# Patient Record
Sex: Male | Born: 1940 | Race: White | Hispanic: No | Marital: Married | Smoking: Former smoker
Health system: Southern US, Community
[De-identification: ages and names within clinical notes are randomized; demographics above are authoritative.]

## PROBLEM LIST (undated history)

## (undated) DIAGNOSIS — I482 Chronic atrial fibrillation, unspecified: Secondary | ICD-10-CM

## (undated) DIAGNOSIS — M109 Gout, unspecified: Secondary | ICD-10-CM

## (undated) DIAGNOSIS — E7800 Pure hypercholesterolemia, unspecified: Secondary | ICD-10-CM

## (undated) DIAGNOSIS — I340 Nonrheumatic mitral (valve) insufficiency: Secondary | ICD-10-CM

## (undated) DIAGNOSIS — M310 Hypersensitivity angiitis: Secondary | ICD-10-CM

## (undated) DIAGNOSIS — D0460 Carcinoma in situ of skin of unspecified upper limb, including shoulder: Secondary | ICD-10-CM

## (undated) DIAGNOSIS — T4145XA Adverse effect of unspecified anesthetic, initial encounter: Secondary | ICD-10-CM

## (undated) DIAGNOSIS — I5030 Unspecified diastolic (congestive) heart failure: Secondary | ICD-10-CM

## (undated) DIAGNOSIS — I10 Essential (primary) hypertension: Secondary | ICD-10-CM

## (undated) DIAGNOSIS — M4800 Spinal stenosis, site unspecified: Secondary | ICD-10-CM

## (undated) DIAGNOSIS — T8859XA Other complications of anesthesia, initial encounter: Secondary | ICD-10-CM

## (undated) DIAGNOSIS — IMO0002 Reserved for concepts with insufficient information to code with codable children: Secondary | ICD-10-CM

## (undated) HISTORY — PX: COLONOSCOPY: SHX174

## (undated) HISTORY — DX: Gout, unspecified: M10.9

## (undated) HISTORY — PX: CATARACT EXTRACTION: SUR2

## (undated) HISTORY — DX: Spinal stenosis, site unspecified: M48.00

## (undated) HISTORY — DX: Carcinoma in situ of skin of unspecified upper limb, including shoulder: D04.60

## (undated) HISTORY — DX: Pure hypercholesterolemia, unspecified: E78.00

---

## 1997-10-27 ENCOUNTER — Ambulatory Visit (HOSPITAL_COMMUNITY): Admission: RE | Admit: 1997-10-27 | Discharge: 1997-10-27 | Payer: Self-pay | Admitting: *Deleted

## 1998-02-08 ENCOUNTER — Ambulatory Visit (HOSPITAL_COMMUNITY): Admission: RE | Admit: 1998-02-08 | Discharge: 1998-02-08 | Payer: Self-pay | Admitting: Gastroenterology

## 2000-01-08 ENCOUNTER — Encounter: Payer: Self-pay | Admitting: *Deleted

## 2000-01-08 ENCOUNTER — Encounter: Admission: RE | Admit: 2000-01-08 | Discharge: 2000-01-08 | Payer: Self-pay | Admitting: *Deleted

## 2001-03-03 ENCOUNTER — Ambulatory Visit (HOSPITAL_COMMUNITY): Admission: RE | Admit: 2001-03-03 | Discharge: 2001-03-03 | Payer: Self-pay | Admitting: Gastroenterology

## 2002-08-18 ENCOUNTER — Encounter: Admission: RE | Admit: 2002-08-18 | Discharge: 2002-08-18 | Payer: Self-pay | Admitting: Internal Medicine

## 2002-08-18 ENCOUNTER — Encounter: Payer: Self-pay | Admitting: Internal Medicine

## 2002-09-11 ENCOUNTER — Encounter: Admission: RE | Admit: 2002-09-11 | Discharge: 2002-09-11 | Payer: Self-pay | Admitting: Internal Medicine

## 2002-09-11 ENCOUNTER — Encounter: Payer: Self-pay | Admitting: Internal Medicine

## 2008-06-18 HISTORY — PX: LEG SKIN LESION  BIOPSY / EXCISION: SUR473

## 2009-10-05 ENCOUNTER — Ambulatory Visit: Payer: Self-pay | Admitting: Vascular Surgery

## 2010-10-31 NOTE — Procedures (Signed)
RENAL ARTERY DUPLEX EVALUATION   INDICATION:  Elevated blood pressure, rule out renal artery stenosis.   HISTORY:  Diabetes:  No.  Cardiac:  No.  Hypertension:  Yes.  Smoking:  Previous.   RENAL ARTERY DUPLEX FINDINGS:  Aorta-Proximal:  97 cm/s  Aorta-Mid:  105 cm/s  Aorta-Distal:  99 cm/s  Celiac Artery Origin:  421 cm/s  SMA Origin:  185 cm/s                                    RIGHT               LEFT  Renal Artery Origin:             238 cm/s            103 cm/s  Renal Artery Proximal:           216 cm/s            186 cm/s  Renal Artery Mid:                169 cm/s            166 cm/s  Renal Artery Distal:             178 cm/s            83 cm/s  Hilar Acceleration Time (AT):  Renal-Aortic Ratio (RAR):        2.23                1.92  Kidney Size:                     12.8 cm             12.5 cm  End Diastolic Ratio (EDR):  Resistive Index (RI):            0.79                0.80   IMPRESSION:  1. Bilateral kidneys appear within normal limits in regards to shape      and size.  2. Patent bilateral renal arteries with mildly elevated velocities;      however, no evidence of >60% stenosis.  3. Bilateral renal-aortic ratios appear within normal limits.  4. Bilateral resistive indices appear slightly abnormal.  5. 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   ___________________________________________  Janetta Hora Fields, MD   AS/MEDQ  D:  10/05/2009  T:  10/05/2009  Job:  [id number]

## 2010-11-03 NOTE — Procedures (Signed)
[facility]  Patient:    Jacob Shannon, Jacob Shannon Visit Number: [id number] MRN: [id number]          Service Type: END Location: ENDO Attending Physician:  Louie Bun Dictated by:   Everardo All Madilyn Fireman, M.D. Proc. Date: 03/03/01 Admit Date:  03/03/2001   CC:         Shilpa N. Karilyn Cota, M.D.   Procedure Report  PROCEDURE:  Colonoscopy.  INDICATIONS FOR PROCEDURE:  History of adenomatous colon polyps.  Due for surveillance.  He has also had heme-positive stools recently.  DESCRIPTION OF PROCEDURE:  The patient was placed in the left lateral decubitus position and placed on the pulse monitor, with continuous low-flow oxygen delivered by nasal cannula.  He was sedated with 60 mg IV Demerol and 6 mg IV Versed.  The Olympus video colonoscope was inserted into the rectum and advanced to the cecum, confirmed by transillumination of McBurneys point and visualization of the ileocecal valve and appendiceal orifice.  The prep was good.  The cecum, ascending, transverse, descending and sigmoid colon all appeared normal; with no masses, polyps, diverticuli or other mucosal abnormalities.  The rectum appeared normal in retroflex view.  The anus did revealed some small internal hemorrhoids; they were moderate to large external hemorrhoids seen on visual examination.  The colonoscope was then withdrawn.  The patient returned to the recovery room in stable condition.  He tolerated the procedure well and there were no immediate complications.  IMPRESSION: 1. Internal and external hemorrhoids. 2. Otherwise normal colonoscopy.  PLAN:  Repeat colonoscopy in five years. Dictated by:   Everardo All Madilyn Fireman, M.D. Attending Physician:  Louie Bun DD:  03/03/01 TD:  03/03/01 Job: [id number]

## 2010-12-15 ENCOUNTER — Ambulatory Visit (HOSPITAL_COMMUNITY): Payer: Self-pay

## 2010-12-19 ENCOUNTER — Encounter (HOSPITAL_COMMUNITY): Payer: Self-pay

## 2011-08-22 DIAGNOSIS — Z125 Encounter for screening for malignant neoplasm of prostate: Secondary | ICD-10-CM | POA: Diagnosis not present

## 2011-08-22 DIAGNOSIS — I10 Essential (primary) hypertension: Secondary | ICD-10-CM | POA: Diagnosis not present

## 2011-08-22 DIAGNOSIS — Z6832 Body mass index (BMI) 32.0-32.9, adult: Secondary | ICD-10-CM | POA: Diagnosis not present

## 2011-08-22 DIAGNOSIS — I4891 Unspecified atrial fibrillation: Secondary | ICD-10-CM | POA: Diagnosis not present

## 2011-08-22 DIAGNOSIS — Z23 Encounter for immunization: Secondary | ICD-10-CM | POA: Diagnosis not present

## 2011-08-22 DIAGNOSIS — M109 Gout, unspecified: Secondary | ICD-10-CM | POA: Diagnosis not present

## 2011-08-22 DIAGNOSIS — Z79899 Other long term (current) drug therapy: Secondary | ICD-10-CM | POA: Diagnosis not present

## 2011-08-22 DIAGNOSIS — E7800 Pure hypercholesterolemia, unspecified: Secondary | ICD-10-CM | POA: Diagnosis not present

## 2011-08-22 DIAGNOSIS — Z0000 Encounter for general adult medical examination without abnormal findings: Secondary | ICD-10-CM | POA: Diagnosis not present

## 2011-10-05 DIAGNOSIS — J029 Acute pharyngitis, unspecified: Secondary | ICD-10-CM | POA: Diagnosis not present

## 2011-10-05 DIAGNOSIS — J320 Chronic maxillary sinusitis: Secondary | ICD-10-CM | POA: Diagnosis not present

## 2011-10-26 DIAGNOSIS — R21 Rash and other nonspecific skin eruption: Secondary | ICD-10-CM | POA: Diagnosis not present

## 2011-10-26 DIAGNOSIS — I4891 Unspecified atrial fibrillation: Secondary | ICD-10-CM | POA: Diagnosis not present

## 2011-12-13 DIAGNOSIS — D126 Benign neoplasm of colon, unspecified: Secondary | ICD-10-CM | POA: Diagnosis not present

## 2011-12-13 DIAGNOSIS — Z09 Encounter for follow-up examination after completed treatment for conditions other than malignant neoplasm: Secondary | ICD-10-CM | POA: Diagnosis not present

## 2011-12-13 DIAGNOSIS — Z800 Family history of malignant neoplasm of digestive organs: Secondary | ICD-10-CM | POA: Diagnosis not present

## 2011-12-13 DIAGNOSIS — K5730 Diverticulosis of large intestine without perforation or abscess without bleeding: Secondary | ICD-10-CM | POA: Diagnosis not present

## 2011-12-13 DIAGNOSIS — Z86010 Personal history of colonic polyps: Secondary | ICD-10-CM | POA: Diagnosis not present

## 2012-02-26 DIAGNOSIS — M109 Gout, unspecified: Secondary | ICD-10-CM | POA: Diagnosis not present

## 2012-02-26 DIAGNOSIS — I10 Essential (primary) hypertension: Secondary | ICD-10-CM | POA: Diagnosis not present

## 2012-02-26 DIAGNOSIS — I4891 Unspecified atrial fibrillation: Secondary | ICD-10-CM | POA: Diagnosis not present

## 2012-02-26 DIAGNOSIS — E7800 Pure hypercholesterolemia, unspecified: Secondary | ICD-10-CM | POA: Diagnosis not present

## 2012-02-26 DIAGNOSIS — Z79899 Other long term (current) drug therapy: Secondary | ICD-10-CM | POA: Diagnosis not present

## 2012-07-15 DIAGNOSIS — M109 Gout, unspecified: Secondary | ICD-10-CM | POA: Diagnosis not present

## 2012-07-15 DIAGNOSIS — M25559 Pain in unspecified hip: Secondary | ICD-10-CM | POA: Diagnosis not present

## 2012-08-27 DIAGNOSIS — Z125 Encounter for screening for malignant neoplasm of prostate: Secondary | ICD-10-CM | POA: Diagnosis not present

## 2012-08-27 DIAGNOSIS — E7800 Pure hypercholesterolemia, unspecified: Secondary | ICD-10-CM | POA: Diagnosis not present

## 2012-08-27 DIAGNOSIS — Z0000 Encounter for general adult medical examination without abnormal findings: Secondary | ICD-10-CM | POA: Diagnosis not present

## 2012-08-27 DIAGNOSIS — Z1331 Encounter for screening for depression: Secondary | ICD-10-CM | POA: Diagnosis not present

## 2012-08-27 DIAGNOSIS — Z79899 Other long term (current) drug therapy: Secondary | ICD-10-CM | POA: Diagnosis not present

## 2012-09-30 DIAGNOSIS — R062 Wheezing: Secondary | ICD-10-CM | POA: Diagnosis not present

## 2012-09-30 DIAGNOSIS — Z139 Encounter for screening, unspecified: Secondary | ICD-10-CM | POA: Diagnosis not present

## 2012-09-30 DIAGNOSIS — J189 Pneumonia, unspecified organism: Secondary | ICD-10-CM | POA: Diagnosis not present

## 2012-09-30 DIAGNOSIS — E860 Dehydration: Secondary | ICD-10-CM | POA: Diagnosis not present

## 2012-09-30 DIAGNOSIS — I10 Essential (primary) hypertension: Secondary | ICD-10-CM | POA: Diagnosis not present

## 2012-09-30 DIAGNOSIS — R0602 Shortness of breath: Secondary | ICD-10-CM | POA: Diagnosis not present

## 2012-09-30 DIAGNOSIS — N390 Urinary tract infection, site not specified: Secondary | ICD-10-CM | POA: Diagnosis not present

## 2012-10-01 DIAGNOSIS — Z136 Encounter for screening for cardiovascular disorders: Secondary | ICD-10-CM | POA: Diagnosis not present

## 2012-10-01 DIAGNOSIS — R5383 Other fatigue: Secondary | ICD-10-CM | POA: Diagnosis not present

## 2012-10-01 DIAGNOSIS — J189 Pneumonia, unspecified organism: Secondary | ICD-10-CM | POA: Diagnosis not present

## 2012-10-01 DIAGNOSIS — Z1329 Encounter for screening for other suspected endocrine disorder: Secondary | ICD-10-CM | POA: Diagnosis not present

## 2012-10-01 DIAGNOSIS — R042 Hemoptysis: Secondary | ICD-10-CM | POA: Diagnosis not present

## 2012-10-02 DIAGNOSIS — R042 Hemoptysis: Secondary | ICD-10-CM | POA: Diagnosis not present

## 2012-10-02 DIAGNOSIS — J189 Pneumonia, unspecified organism: Secondary | ICD-10-CM | POA: Diagnosis not present

## 2012-10-03 DIAGNOSIS — R05 Cough: Secondary | ICD-10-CM | POA: Diagnosis not present

## 2012-10-03 DIAGNOSIS — J189 Pneumonia, unspecified organism: Secondary | ICD-10-CM | POA: Diagnosis not present

## 2012-10-07 DIAGNOSIS — J189 Pneumonia, unspecified organism: Secondary | ICD-10-CM | POA: Diagnosis not present

## 2012-10-07 DIAGNOSIS — J90 Pleural effusion, not elsewhere classified: Secondary | ICD-10-CM | POA: Diagnosis not present

## 2012-10-10 DIAGNOSIS — R05 Cough: Secondary | ICD-10-CM | POA: Diagnosis not present

## 2012-10-10 DIAGNOSIS — R937 Abnormal findings on diagnostic imaging of other parts of musculoskeletal system: Secondary | ICD-10-CM | POA: Diagnosis not present

## 2012-10-10 DIAGNOSIS — J189 Pneumonia, unspecified organism: Secondary | ICD-10-CM | POA: Diagnosis not present

## 2012-10-23 DIAGNOSIS — I10 Essential (primary) hypertension: Secondary | ICD-10-CM | POA: Diagnosis not present

## 2012-10-23 DIAGNOSIS — R609 Edema, unspecified: Secondary | ICD-10-CM | POA: Diagnosis not present

## 2012-12-30 DIAGNOSIS — R209 Unspecified disturbances of skin sensation: Secondary | ICD-10-CM | POA: Diagnosis not present

## 2013-03-26 DIAGNOSIS — Z23 Encounter for immunization: Secondary | ICD-10-CM | POA: Diagnosis not present

## 2013-04-27 DIAGNOSIS — M4800 Spinal stenosis, site unspecified: Secondary | ICD-10-CM | POA: Diagnosis not present

## 2013-04-27 DIAGNOSIS — I10 Essential (primary) hypertension: Secondary | ICD-10-CM | POA: Diagnosis not present

## 2013-04-27 DIAGNOSIS — Z79899 Other long term (current) drug therapy: Secondary | ICD-10-CM | POA: Diagnosis not present

## 2013-04-27 DIAGNOSIS — E7800 Pure hypercholesterolemia, unspecified: Secondary | ICD-10-CM | POA: Diagnosis not present

## 2013-04-27 DIAGNOSIS — I4891 Unspecified atrial fibrillation: Secondary | ICD-10-CM | POA: Diagnosis not present

## 2013-09-21 DIAGNOSIS — R059 Cough, unspecified: Secondary | ICD-10-CM | POA: Diagnosis not present

## 2013-09-21 DIAGNOSIS — R509 Fever, unspecified: Secondary | ICD-10-CM | POA: Diagnosis not present

## 2013-09-21 DIAGNOSIS — Z6833 Body mass index (BMI) 33.0-33.9, adult: Secondary | ICD-10-CM | POA: Diagnosis not present

## 2013-09-21 DIAGNOSIS — R05 Cough: Secondary | ICD-10-CM | POA: Diagnosis not present

## 2013-09-23 DIAGNOSIS — L03119 Cellulitis of unspecified part of limb: Secondary | ICD-10-CM | POA: Diagnosis not present

## 2013-09-23 DIAGNOSIS — L02419 Cutaneous abscess of limb, unspecified: Secondary | ICD-10-CM | POA: Diagnosis not present

## 2013-09-24 DIAGNOSIS — L02419 Cutaneous abscess of limb, unspecified: Secondary | ICD-10-CM | POA: Diagnosis not present

## 2013-09-24 DIAGNOSIS — Z9181 History of falling: Secondary | ICD-10-CM | POA: Diagnosis not present

## 2013-09-24 DIAGNOSIS — Z1331 Encounter for screening for depression: Secondary | ICD-10-CM | POA: Diagnosis not present

## 2013-09-24 DIAGNOSIS — L03119 Cellulitis of unspecified part of limb: Secondary | ICD-10-CM | POA: Diagnosis not present

## 2013-09-25 DIAGNOSIS — L02419 Cutaneous abscess of limb, unspecified: Secondary | ICD-10-CM | POA: Diagnosis not present

## 2013-09-25 DIAGNOSIS — L988 Other specified disorders of the skin and subcutaneous tissue: Secondary | ICD-10-CM | POA: Diagnosis not present

## 2013-09-27 DIAGNOSIS — L988 Other specified disorders of the skin and subcutaneous tissue: Secondary | ICD-10-CM | POA: Diagnosis not present

## 2013-09-27 DIAGNOSIS — L03119 Cellulitis of unspecified part of limb: Secondary | ICD-10-CM | POA: Diagnosis not present

## 2013-09-27 DIAGNOSIS — L02419 Cutaneous abscess of limb, unspecified: Secondary | ICD-10-CM | POA: Diagnosis not present

## 2013-09-28 DIAGNOSIS — L02419 Cutaneous abscess of limb, unspecified: Secondary | ICD-10-CM | POA: Diagnosis not present

## 2013-09-28 DIAGNOSIS — L988 Other specified disorders of the skin and subcutaneous tissue: Secondary | ICD-10-CM | POA: Diagnosis not present

## 2013-09-29 DIAGNOSIS — L03119 Cellulitis of unspecified part of limb: Secondary | ICD-10-CM | POA: Diagnosis not present

## 2013-09-29 DIAGNOSIS — L02419 Cutaneous abscess of limb, unspecified: Secondary | ICD-10-CM | POA: Diagnosis not present

## 2013-09-29 DIAGNOSIS — L988 Other specified disorders of the skin and subcutaneous tissue: Secondary | ICD-10-CM | POA: Diagnosis not present

## 2013-10-28 DIAGNOSIS — E7800 Pure hypercholesterolemia, unspecified: Secondary | ICD-10-CM | POA: Diagnosis not present

## 2013-10-28 DIAGNOSIS — Z125 Encounter for screening for malignant neoplasm of prostate: Secondary | ICD-10-CM | POA: Diagnosis not present

## 2013-10-28 DIAGNOSIS — Z0000 Encounter for general adult medical examination without abnormal findings: Secondary | ICD-10-CM | POA: Diagnosis not present

## 2013-10-28 DIAGNOSIS — I776 Arteritis, unspecified: Secondary | ICD-10-CM | POA: Diagnosis not present

## 2013-10-28 DIAGNOSIS — I10 Essential (primary) hypertension: Secondary | ICD-10-CM | POA: Diagnosis not present

## 2013-10-29 DIAGNOSIS — H2510 Age-related nuclear cataract, unspecified eye: Secondary | ICD-10-CM | POA: Diagnosis not present

## 2013-10-30 DIAGNOSIS — H2510 Age-related nuclear cataract, unspecified eye: Secondary | ICD-10-CM | POA: Diagnosis not present

## 2013-11-03 ENCOUNTER — Ambulatory Visit: Payer: Self-pay | Admitting: Ophthalmology

## 2013-11-03 DIAGNOSIS — I10 Essential (primary) hypertension: Secondary | ICD-10-CM | POA: Diagnosis not present

## 2013-11-03 DIAGNOSIS — M109 Gout, unspecified: Secondary | ICD-10-CM | POA: Diagnosis not present

## 2013-11-03 DIAGNOSIS — H269 Unspecified cataract: Secondary | ICD-10-CM | POA: Diagnosis not present

## 2013-11-03 DIAGNOSIS — E669 Obesity, unspecified: Secondary | ICD-10-CM | POA: Diagnosis not present

## 2013-11-03 DIAGNOSIS — H2510 Age-related nuclear cataract, unspecified eye: Secondary | ICD-10-CM | POA: Diagnosis not present

## 2013-11-03 DIAGNOSIS — I4891 Unspecified atrial fibrillation: Secondary | ICD-10-CM | POA: Diagnosis not present

## 2013-11-03 DIAGNOSIS — Z79899 Other long term (current) drug therapy: Secondary | ICD-10-CM | POA: Diagnosis not present

## 2013-11-03 DIAGNOSIS — Z7901 Long term (current) use of anticoagulants: Secondary | ICD-10-CM | POA: Diagnosis not present

## 2013-11-03 DIAGNOSIS — R609 Edema, unspecified: Secondary | ICD-10-CM | POA: Diagnosis not present

## 2013-11-27 DIAGNOSIS — H2510 Age-related nuclear cataract, unspecified eye: Secondary | ICD-10-CM | POA: Diagnosis not present

## 2013-12-02 DIAGNOSIS — I776 Arteritis, unspecified: Secondary | ICD-10-CM | POA: Diagnosis not present

## 2013-12-02 DIAGNOSIS — M109 Gout, unspecified: Secondary | ICD-10-CM | POA: Diagnosis not present

## 2013-12-02 DIAGNOSIS — R609 Edema, unspecified: Secondary | ICD-10-CM | POA: Diagnosis not present

## 2013-12-02 DIAGNOSIS — I10 Essential (primary) hypertension: Secondary | ICD-10-CM | POA: Diagnosis not present

## 2013-12-08 ENCOUNTER — Ambulatory Visit: Payer: Self-pay | Admitting: Ophthalmology

## 2013-12-08 DIAGNOSIS — Z87891 Personal history of nicotine dependence: Secondary | ICD-10-CM | POA: Diagnosis not present

## 2013-12-08 DIAGNOSIS — M109 Gout, unspecified: Secondary | ICD-10-CM | POA: Diagnosis not present

## 2013-12-08 DIAGNOSIS — I10 Essential (primary) hypertension: Secondary | ICD-10-CM | POA: Diagnosis not present

## 2013-12-08 DIAGNOSIS — H2510 Age-related nuclear cataract, unspecified eye: Secondary | ICD-10-CM | POA: Diagnosis not present

## 2013-12-08 DIAGNOSIS — I4891 Unspecified atrial fibrillation: Secondary | ICD-10-CM | POA: Diagnosis not present

## 2014-05-04 DIAGNOSIS — Z9181 History of falling: Secondary | ICD-10-CM | POA: Diagnosis not present

## 2014-05-04 DIAGNOSIS — I4891 Unspecified atrial fibrillation: Secondary | ICD-10-CM | POA: Diagnosis not present

## 2014-05-04 DIAGNOSIS — Z1389 Encounter for screening for other disorder: Secondary | ICD-10-CM | POA: Diagnosis not present

## 2014-05-04 DIAGNOSIS — M109 Gout, unspecified: Secondary | ICD-10-CM | POA: Diagnosis not present

## 2014-05-04 DIAGNOSIS — Z79899 Other long term (current) drug therapy: Secondary | ICD-10-CM | POA: Diagnosis not present

## 2014-05-04 DIAGNOSIS — M4806 Spinal stenosis, lumbar region: Secondary | ICD-10-CM | POA: Diagnosis not present

## 2014-05-04 DIAGNOSIS — E780 Pure hypercholesterolemia: Secondary | ICD-10-CM | POA: Diagnosis not present

## 2014-05-04 DIAGNOSIS — Z23 Encounter for immunization: Secondary | ICD-10-CM | POA: Diagnosis not present

## 2014-05-04 DIAGNOSIS — I10 Essential (primary) hypertension: Secondary | ICD-10-CM | POA: Diagnosis not present

## 2014-07-23 DIAGNOSIS — Z961 Presence of intraocular lens: Secondary | ICD-10-CM | POA: Diagnosis not present

## 2014-10-05 DIAGNOSIS — J9601 Acute respiratory failure with hypoxia: Secondary | ICD-10-CM | POA: Diagnosis present

## 2014-10-05 DIAGNOSIS — R918 Other nonspecific abnormal finding of lung field: Secondary | ICD-10-CM | POA: Diagnosis not present

## 2014-10-05 DIAGNOSIS — M109 Gout, unspecified: Secondary | ICD-10-CM | POA: Diagnosis not present

## 2014-10-05 DIAGNOSIS — J9691 Respiratory failure, unspecified with hypoxia: Secondary | ICD-10-CM | POA: Diagnosis not present

## 2014-10-05 DIAGNOSIS — L03115 Cellulitis of right lower limb: Secondary | ICD-10-CM | POA: Diagnosis not present

## 2014-10-05 DIAGNOSIS — I517 Cardiomegaly: Secondary | ICD-10-CM | POA: Diagnosis not present

## 2014-10-05 DIAGNOSIS — M4806 Spinal stenosis, lumbar region: Secondary | ICD-10-CM | POA: Diagnosis present

## 2014-10-05 DIAGNOSIS — F05 Delirium due to known physiological condition: Secondary | ICD-10-CM | POA: Diagnosis not present

## 2014-10-05 DIAGNOSIS — R05 Cough: Secondary | ICD-10-CM | POA: Diagnosis not present

## 2014-10-05 DIAGNOSIS — R591 Generalized enlarged lymph nodes: Secondary | ICD-10-CM | POA: Diagnosis not present

## 2014-10-05 DIAGNOSIS — I4891 Unspecified atrial fibrillation: Secondary | ICD-10-CM | POA: Diagnosis not present

## 2014-10-05 DIAGNOSIS — I361 Nonrheumatic tricuspid (valve) insufficiency: Secondary | ICD-10-CM | POA: Diagnosis not present

## 2014-10-05 DIAGNOSIS — R0602 Shortness of breath: Secondary | ICD-10-CM | POA: Diagnosis not present

## 2014-10-05 DIAGNOSIS — E860 Dehydration: Secondary | ICD-10-CM | POA: Diagnosis not present

## 2014-10-05 DIAGNOSIS — I480 Paroxysmal atrial fibrillation: Secondary | ICD-10-CM | POA: Diagnosis not present

## 2014-10-05 DIAGNOSIS — E876 Hypokalemia: Secondary | ICD-10-CM | POA: Diagnosis not present

## 2014-10-05 DIAGNOSIS — J439 Emphysema, unspecified: Secondary | ICD-10-CM | POA: Diagnosis not present

## 2014-10-05 DIAGNOSIS — E871 Hypo-osmolality and hyponatremia: Secondary | ICD-10-CM | POA: Diagnosis not present

## 2014-10-05 DIAGNOSIS — D690 Allergic purpura: Secondary | ICD-10-CM | POA: Diagnosis not present

## 2014-10-05 DIAGNOSIS — R0902 Hypoxemia: Secondary | ICD-10-CM | POA: Diagnosis not present

## 2014-10-05 DIAGNOSIS — R531 Weakness: Secondary | ICD-10-CM | POA: Diagnosis not present

## 2014-10-05 DIAGNOSIS — R938 Abnormal findings on diagnostic imaging of other specified body structures: Secondary | ICD-10-CM | POA: Diagnosis not present

## 2014-10-05 DIAGNOSIS — E780 Pure hypercholesterolemia: Secondary | ICD-10-CM | POA: Diagnosis present

## 2014-10-05 DIAGNOSIS — I340 Nonrheumatic mitral (valve) insufficiency: Secondary | ICD-10-CM | POA: Diagnosis not present

## 2014-10-05 DIAGNOSIS — J810 Acute pulmonary edema: Secondary | ICD-10-CM | POA: Diagnosis not present

## 2014-10-05 DIAGNOSIS — I10 Essential (primary) hypertension: Secondary | ICD-10-CM | POA: Diagnosis present

## 2014-10-05 DIAGNOSIS — M7989 Other specified soft tissue disorders: Secondary | ICD-10-CM | POA: Diagnosis not present

## 2014-10-05 DIAGNOSIS — Z79899 Other long term (current) drug therapy: Secondary | ICD-10-CM | POA: Diagnosis not present

## 2014-10-05 DIAGNOSIS — L539 Erythematous condition, unspecified: Secondary | ICD-10-CM | POA: Diagnosis not present

## 2014-10-05 DIAGNOSIS — I350 Nonrheumatic aortic (valve) stenosis: Secondary | ICD-10-CM | POA: Diagnosis not present

## 2014-10-05 DIAGNOSIS — I472 Ventricular tachycardia: Secondary | ICD-10-CM | POA: Diagnosis not present

## 2014-10-05 DIAGNOSIS — I776 Arteritis, unspecified: Secondary | ICD-10-CM | POA: Diagnosis not present

## 2014-10-05 DIAGNOSIS — R509 Fever, unspecified: Secondary | ICD-10-CM | POA: Diagnosis not present

## 2014-10-07 DIAGNOSIS — I272 Pulmonary hypertension, unspecified: Secondary | ICD-10-CM | POA: Insufficient documentation

## 2014-10-13 DIAGNOSIS — L02419 Cutaneous abscess of limb, unspecified: Secondary | ICD-10-CM | POA: Diagnosis not present

## 2014-10-13 DIAGNOSIS — I4891 Unspecified atrial fibrillation: Secondary | ICD-10-CM | POA: Diagnosis not present

## 2014-10-13 DIAGNOSIS — R21 Rash and other nonspecific skin eruption: Secondary | ICD-10-CM | POA: Diagnosis not present

## 2014-10-13 DIAGNOSIS — I776 Arteritis, unspecified: Secondary | ICD-10-CM | POA: Diagnosis not present

## 2014-10-18 DIAGNOSIS — I776 Arteritis, unspecified: Secondary | ICD-10-CM | POA: Diagnosis not present

## 2014-10-18 DIAGNOSIS — I9589 Other hypotension: Secondary | ICD-10-CM | POA: Diagnosis not present

## 2014-10-18 DIAGNOSIS — R21 Rash and other nonspecific skin eruption: Secondary | ICD-10-CM | POA: Diagnosis not present

## 2014-10-27 DIAGNOSIS — Z9181 History of falling: Secondary | ICD-10-CM | POA: Diagnosis not present

## 2014-10-27 DIAGNOSIS — I10 Essential (primary) hypertension: Secondary | ICD-10-CM | POA: Diagnosis not present

## 2014-10-27 DIAGNOSIS — Z1389 Encounter for screening for other disorder: Secondary | ICD-10-CM | POA: Diagnosis not present

## 2014-10-27 DIAGNOSIS — I776 Arteritis, unspecified: Secondary | ICD-10-CM | POA: Diagnosis not present

## 2014-10-27 DIAGNOSIS — Z6832 Body mass index (BMI) 32.0-32.9, adult: Secondary | ICD-10-CM | POA: Diagnosis not present

## 2014-11-25 DIAGNOSIS — L02419 Cutaneous abscess of limb, unspecified: Secondary | ICD-10-CM | POA: Diagnosis not present

## 2014-11-25 DIAGNOSIS — Z6830 Body mass index (BMI) 30.0-30.9, adult: Secondary | ICD-10-CM | POA: Diagnosis not present

## 2014-11-25 DIAGNOSIS — I776 Arteritis, unspecified: Secondary | ICD-10-CM | POA: Diagnosis not present

## 2014-11-26 DIAGNOSIS — I776 Arteritis, unspecified: Secondary | ICD-10-CM | POA: Diagnosis not present

## 2014-11-26 DIAGNOSIS — L02419 Cutaneous abscess of limb, unspecified: Secondary | ICD-10-CM | POA: Diagnosis not present

## 2014-11-26 DIAGNOSIS — Z6833 Body mass index (BMI) 33.0-33.9, adult: Secondary | ICD-10-CM | POA: Diagnosis not present

## 2014-11-29 DIAGNOSIS — I776 Arteritis, unspecified: Secondary | ICD-10-CM | POA: Diagnosis not present

## 2014-12-17 DIAGNOSIS — I272 Other secondary pulmonary hypertension: Secondary | ICD-10-CM | POA: Diagnosis not present

## 2014-12-17 DIAGNOSIS — R21 Rash and other nonspecific skin eruption: Secondary | ICD-10-CM | POA: Diagnosis not present

## 2014-12-17 DIAGNOSIS — I776 Arteritis, unspecified: Secondary | ICD-10-CM | POA: Diagnosis not present

## 2014-12-17 DIAGNOSIS — R509 Fever, unspecified: Secondary | ICD-10-CM | POA: Diagnosis not present

## 2015-01-20 DIAGNOSIS — I776 Arteritis, unspecified: Secondary | ICD-10-CM | POA: Diagnosis not present

## 2015-01-20 DIAGNOSIS — I10 Essential (primary) hypertension: Secondary | ICD-10-CM | POA: Diagnosis not present

## 2015-01-20 DIAGNOSIS — E780 Pure hypercholesterolemia: Secondary | ICD-10-CM | POA: Diagnosis not present

## 2015-01-20 DIAGNOSIS — Z79899 Other long term (current) drug therapy: Secondary | ICD-10-CM | POA: Diagnosis not present

## 2015-01-20 DIAGNOSIS — Z0000 Encounter for general adult medical examination without abnormal findings: Secondary | ICD-10-CM | POA: Diagnosis not present

## 2015-01-20 DIAGNOSIS — I4891 Unspecified atrial fibrillation: Secondary | ICD-10-CM | POA: Diagnosis not present

## 2015-01-20 DIAGNOSIS — Z6833 Body mass index (BMI) 33.0-33.9, adult: Secondary | ICD-10-CM | POA: Diagnosis not present

## 2015-01-20 DIAGNOSIS — Z125 Encounter for screening for malignant neoplasm of prostate: Secondary | ICD-10-CM | POA: Diagnosis not present

## 2015-02-07 ENCOUNTER — Encounter: Payer: Self-pay | Admitting: *Deleted

## 2015-02-08 ENCOUNTER — Other Ambulatory Visit: Payer: Self-pay | Admitting: *Deleted

## 2015-02-08 ENCOUNTER — Encounter: Payer: Self-pay | Admitting: *Deleted

## 2015-02-08 ENCOUNTER — Other Ambulatory Visit (INDEPENDENT_AMBULATORY_CARE_PROVIDER_SITE_OTHER): Payer: Medicare Other

## 2015-02-08 ENCOUNTER — Ambulatory Visit (INDEPENDENT_AMBULATORY_CARE_PROVIDER_SITE_OTHER): Payer: Medicare Other | Admitting: Pulmonary Disease

## 2015-02-08 VITALS — BP 134/70 | HR 65 | Ht 67.5 in | Wt 209.8 lb

## 2015-02-08 DIAGNOSIS — I5032 Chronic diastolic (congestive) heart failure: Secondary | ICD-10-CM

## 2015-02-08 DIAGNOSIS — I10 Essential (primary) hypertension: Secondary | ICD-10-CM | POA: Insufficient documentation

## 2015-02-08 DIAGNOSIS — I270 Primary pulmonary hypertension: Secondary | ICD-10-CM

## 2015-02-08 DIAGNOSIS — I272 Pulmonary hypertension, unspecified: Secondary | ICD-10-CM

## 2015-02-08 DIAGNOSIS — I776 Arteritis, unspecified: Secondary | ICD-10-CM

## 2015-02-08 DIAGNOSIS — I5030 Unspecified diastolic (congestive) heart failure: Secondary | ICD-10-CM | POA: Insufficient documentation

## 2015-02-08 DIAGNOSIS — R599 Enlarged lymph nodes, unspecified: Secondary | ICD-10-CM | POA: Diagnosis not present

## 2015-02-08 DIAGNOSIS — R590 Localized enlarged lymph nodes: Secondary | ICD-10-CM

## 2015-02-08 DIAGNOSIS — I340 Nonrheumatic mitral (valve) insufficiency: Secondary | ICD-10-CM | POA: Insufficient documentation

## 2015-02-08 DIAGNOSIS — I4891 Unspecified atrial fibrillation: Secondary | ICD-10-CM | POA: Insufficient documentation

## 2015-02-08 DIAGNOSIS — J90 Pleural effusion, not elsewhere classified: Secondary | ICD-10-CM | POA: Insufficient documentation

## 2015-02-08 DIAGNOSIS — M109 Gout, unspecified: Secondary | ICD-10-CM | POA: Insufficient documentation

## 2015-02-08 LAB — C-REACTIVE PROTEIN: CRP: 1.2 mg/dL (ref 0.5–20.0)

## 2015-02-08 NOTE — Patient Instructions (Signed)
1. We're obtaining your pathology results from your dermatologist for my review. 2. I'm repeating a CT scan of your chest and echocardiogram of her heart to evaluate the elevated pressure within your heart, the enlarged lymph nodes around your windpipe, and your bilateral pleural effusions. 3. I'm checking blood work today for potential causes of your vasculitis. 4. I'm ordering breathing tests to evaluate you for possible underlying COPD. 5. You will return to clinic in 4-6 weeks but please call my office if you have any further questions or concerns.

## 2015-02-08 NOTE — Progress Notes (Signed)
Subjective:    Patient ID: Jacob Shannon, male    DOB: 01/04/1941, 74 y.o.   MRN: [id number]  HPI He reports he has had vasculitis first in 2010 and had a biopsy that showed as such.  He reports he has had it twice recently & was treated with antibiotics & Prednisone. It is only in his distal right lower extremity.  He reports there are also blisters that are fluid filled along with skin erythema.  He denies any other rashes. He does bruise easily. He reports a fever with the vasculitis but otherwise no fever, chills, or sweats. He denies any chest pain or pressure. He reports near syncope with turning quickly or standing up quickly. No syncope.  Reports he is able to climb 15 steps from his basement with ease. He reports dyspnea with large meals & exertion. He denies any coughing. He reports wheezing only with respiratory illnesses. No chest pain, tightness, or pressure. He reports edema only in his right leg. He reports only very brief morning stiffness. No joint swelling or erythema.   Review of Systems Denies any dysuria or hematuria. No nausea, emesis, or diarrhea.  A pertinent 14 point review of systems is negative except as per the history of presenting illness.  No Known Allergies  Outpatient Encounter Prescriptions as of 02/08/2015  Medication Sig Note  . amLODipine (NORVASC) 5 MG tablet Take 5 mg by mouth daily.  02/08/2015: Received from: External Pharmacy  . DIGOX 125 MCG tablet Take 0.125 mg by mouth daily.  02/08/2015: Received from: External Pharmacy  . furosemide (LASIX) 40 MG tablet Take 40 mg by mouth daily.  02/08/2015: Received from: External Pharmacy  . losartan (COZAAR) 100 MG tablet Take 100 mg by mouth daily.  02/08/2015: Received from: External Pharmacy  . metoprolol succinate (TOPROL-XL) 100 MG 24 hr tablet Take 100 mg by mouth daily.  02/08/2015: Received from: External Pharmacy  . potassium chloride SA (K-DUR,KLOR-CON) 20 MEQ tablet Take 20 mEq by mouth 2 (two) times daily.   02/08/2015: Received from: External Pharmacy  . PRADAXA 150 MG CAPS capsule Take 150 mg by mouth 2 (two) times daily.  02/08/2015: Received from: External Pharmacy  . [DISCONTINUED] clindamycin (CLEOCIN) 300 MG capsule  02/08/2015: Received from: External Pharmacy  . [DISCONTINUED] colchicine 0.6 MG tablet  02/08/2015: Received from: External Pharmacy  . [DISCONTINUED] predniSONE (DELTASONE) 20 MG tablet  02/08/2015: Received from: External Pharmacy   No facility-administered encounter medications on file as of 02/08/2015.   Past Medical History  Diagnosis Date  . Atrial fibrillation   . Gout   . Hypertension   . Spinal stenosis   . 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 Vasculitis   . Hypercholesterolemia    Past Surgical History  Procedure Laterality Date  . Leg skin lesion  biopsy / excision Right 2010  . Colonoscopy    . Cataract extraction Bilateral     Family History  Problem Relation Age of Onset  . Colon cancer Brother   . Coronary artery disease Brother   . Stomach cancer Brother   . Heart attack Father   . Coronary artery disease Father   . Lung disease Neg Hx   . Rheumatologic disease Neg Hx    Social History   Social History  . Marital Status: Married    Spouse Name: N/A  . Number of Children: 2  . Years of Education: N/A  Occupational History  . self employed      [employer]   Social History Main Topics  . Smoking status: Former Smoker -- 1.50 packs/day for 38 years    Types: Cigarettes    Start date: 06/18/1956    Quit date: 06/18/1994  . Smokeless tobacco: None  . Alcohol Use: 0.0 oz/week    0 Standard drinks or equivalent per week     Comment: Beer on a weekly basis.  . Drug Use: No  . Sexual Activity: Not Asked   Other Topics Concern  . None   Social History Narrative   Originally from [place]. Always lived in [place]. Prior travel to [facility]. 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 No pets currently. No bird, asbestos, mold, or hot tub exposure. He enjoys fishing.       Objective:   Physical Exam Blood pressure 134/70, pulse 65, height 5' 7.5" (1.715 m), weight 209 lb 12.8 oz (95.165 kg), SpO2 98 %. General:  Awake. Alert. No acute distress. Caucasian male with mild central obesity.  Integument:  Warm & dry. No rash on exposed skin. No bruising. Skin discoloration from venous stasis and right lower extremity. Lymphatics:  No appreciated cervical or supraclavicular lymphadenoapthy. HEENT:  Moist mucus membranes. No oral ulcers. No scleral injection or icterus. Cardiovascular:  Regular rate. 1+ pitting bilateral lower extremity edema. No appreciable JVD. Bilateral lower extremity varicose veins noted.   Pulmonary:  Mild bilateral basilar crackles. Symmetric chest wall expansion. No accessory muscle use. Abdomen: Soft. Normal bowel sounds. Nondistended. Grossly nontender. Musculoskeletal:  Normal bulk and tone. Hand grip strength 5/5 bilaterally. No joint deformity or effusion appreciated. Neurological:  CN 2-12 grossly in tact. No meningismus. Moving all 4 extremities equally. Symmetric patellar deep tendon reflexes. Psychiatric:  Mood and affect congruent. Speech normal rhythm, rate & tone.   IMAGING CTA CHEST 10/07/14 (personally reviewed by me): Significant motion artifact. Cardiomegaly noted. Small bilateral pleural effusions. 1.1 cm precarinal lymph node & 1.3 cm subcarinal lymph node noted. Bilateral lower lung dependent opacities. No pulmonary nodule or mass appreciated. Lobular hepatic contour suggestive of cirrhosis. Enlarged gastrohepatic ligament lymph node measuring 1.6 cm by radiologist. Trace pericardial effusion. No pulmonary embolus.  CARDIAC TTE (10/07/14): Technically difficult study with suboptimal views. Mild concentric LVH with EF 60-65%. Without regional wall motion abnormality. LA & RA  markedly enlarged. RV normal in size and function. RVSP 62 mmHg. IVC dilated. No pericardial effusion. Mild aortic valve sclerosis. Moderate mitral regurgitation. Moderate to severe tricuspid regurgitation. No pulmonic regurgitation.  LABS 10/06/14 CBC: 19.2/13.4/41.0/119 BMP: 135/4.2/101/26/19/0.8/118/8.0 ABG (on 2 L/m): 7.42/33/71    Assessment & Plan:  The patient is a 74 year old male with a significant prior history of tobacco use reporting that he has had repeated episodes of "vasculitis" primarily involving his right lower extremity. He does report fluid filled bullae with these episodes. He reports that he did previously have a skin biopsy which showed vasculitis but this is not available for my review at this time. In reviewing his outside records and chest CT imaging he does have significant mitral regurgitation as well as left ventricular hypertrophy which are likely contributing to the bilateral pleural effusions and mediastinal lymphadenopathy seen. Certainly a pulmonary vasculitis is possible but somewhat unlikely given his age and historical description of his "vasculitis". I do question whether or not he has underlying COPD  given his history of tobacco use. I instructed the patient contact my office if he had any further questions or concerns before his next appointment. We're attempting to schedule his testing as close to home as possible for ease of travel.  1. Pulmonary hypertension: Likely secondary to diastolic congestive heart failure & mitral regurgitation. Repeating transthoracic echocardiogram to reevaluate the patient's RVSP and right ventricular function on Lasix. 2. Chronic diastolic congestive heart failure: Patient currently on Lasix. Unable to appreciate JVD but does continue to have mild pitting lower extremity edema possibly secondary to venous insufficiency. 3. Vasculitis: Requesting pathology results from his dermatologist. Checking serum ESR, CRP, ANA with reflex to  comprehensive panel, & ANCA panel. 4. Mediastinal lymphadenopathy: Likely secondary to diastolic congestive heart failure. Repeating CT scan of the chest without contrast to reevaluate. 5. Bilateral pleural effusion: Likely secondary to decompensated diastolic congestive heart failure. Repeating CT scan of the chest without contrast. Consider thoracentesis if these are enlarging. 6. Follow-up: Patient to return to clinic in 4-6 weeks or sooner if needed.

## 2015-02-09 ENCOUNTER — Telehealth (HOSPITAL_COMMUNITY): Payer: Self-pay | Admitting: *Deleted

## 2015-02-09 DIAGNOSIS — M2550 Pain in unspecified joint: Secondary | ICD-10-CM | POA: Diagnosis not present

## 2015-02-09 DIAGNOSIS — M310 Hypersensitivity angiitis: Secondary | ICD-10-CM | POA: Diagnosis not present

## 2015-02-09 DIAGNOSIS — R5081 Fever presenting with conditions classified elsewhere: Secondary | ICD-10-CM | POA: Diagnosis not present

## 2015-02-09 LAB — ANA COMPREHENSIVE PANEL
Anti JO-1: <0.2 AI (ref 0.0–0.9)
Centromere Ab Screen: <0.2 AI (ref 0.0–0.9)
ENA RNP Ab: 0.3 AI (ref 0.0–0.9)
dsDNA Ab: 1 IU/mL (ref 0–9)

## 2015-02-09 LAB — ANA W/REFLEX: ANA: NEGATIVE

## 2015-02-09 LAB — SEDIMENTATION RATE: Sed Rate: 17 mm/hr (ref 0–22)

## 2015-02-10 ENCOUNTER — Telehealth (HOSPITAL_COMMUNITY): Payer: Self-pay | Admitting: *Deleted

## 2015-02-11 DIAGNOSIS — M2550 Pain in unspecified joint: Secondary | ICD-10-CM | POA: Diagnosis not present

## 2015-02-11 DIAGNOSIS — R5081 Fever presenting with conditions classified elsewhere: Secondary | ICD-10-CM | POA: Diagnosis not present

## 2015-02-11 DIAGNOSIS — M310 Hypersensitivity angiitis: Secondary | ICD-10-CM | POA: Diagnosis not present

## 2015-02-11 DIAGNOSIS — L03115 Cellulitis of right lower limb: Secondary | ICD-10-CM | POA: Diagnosis not present

## 2015-02-14 DIAGNOSIS — R609 Edema, unspecified: Secondary | ICD-10-CM | POA: Diagnosis not present

## 2015-02-14 DIAGNOSIS — I776 Arteritis, unspecified: Secondary | ICD-10-CM | POA: Diagnosis not present

## 2015-02-14 DIAGNOSIS — L03115 Cellulitis of right lower limb: Secondary | ICD-10-CM | POA: Diagnosis not present

## 2015-02-14 DIAGNOSIS — Z6832 Body mass index (BMI) 32.0-32.9, adult: Secondary | ICD-10-CM | POA: Diagnosis not present

## 2015-02-15 DIAGNOSIS — J90 Pleural effusion, not elsewhere classified: Secondary | ICD-10-CM | POA: Diagnosis not present

## 2015-02-15 DIAGNOSIS — R591 Generalized enlarged lymph nodes: Secondary | ICD-10-CM | POA: Diagnosis not present

## 2015-02-15 DIAGNOSIS — I083 Combined rheumatic disorders of mitral, aortic and tricuspid valves: Secondary | ICD-10-CM | POA: Diagnosis not present

## 2015-02-15 DIAGNOSIS — I270 Primary pulmonary hypertension: Secondary | ICD-10-CM | POA: Diagnosis not present

## 2015-02-15 DIAGNOSIS — I776 Arteritis, unspecified: Secondary | ICD-10-CM | POA: Diagnosis not present

## 2015-02-15 DIAGNOSIS — J9811 Atelectasis: Secondary | ICD-10-CM | POA: Diagnosis not present

## 2015-03-01 DIAGNOSIS — I270 Primary pulmonary hypertension: Secondary | ICD-10-CM | POA: Diagnosis not present

## 2015-03-01 DIAGNOSIS — R599 Enlarged lymph nodes, unspecified: Secondary | ICD-10-CM | POA: Diagnosis not present

## 2015-03-01 LAB — PULMONARY FUNCTION TEST

## 2015-03-08 ENCOUNTER — Encounter: Payer: Self-pay | Admitting: Pulmonary Disease

## 2015-03-08 ENCOUNTER — Ambulatory Visit (INDEPENDENT_AMBULATORY_CARE_PROVIDER_SITE_OTHER): Payer: Medicare Other | Admitting: Pulmonary Disease

## 2015-03-08 ENCOUNTER — Other Ambulatory Visit: Payer: Medicare Other

## 2015-03-08 VITALS — BP 132/72 | HR 83 | Ht 67.5 in | Wt 210.8 lb

## 2015-03-08 DIAGNOSIS — I776 Arteritis, unspecified: Secondary | ICD-10-CM

## 2015-03-08 DIAGNOSIS — I270 Primary pulmonary hypertension: Secondary | ICD-10-CM

## 2015-03-08 DIAGNOSIS — R590 Localized enlarged lymph nodes: Secondary | ICD-10-CM | POA: Insufficient documentation

## 2015-03-08 DIAGNOSIS — R599 Enlarged lymph nodes, unspecified: Secondary | ICD-10-CM | POA: Diagnosis not present

## 2015-03-08 DIAGNOSIS — I272 Pulmonary hypertension, unspecified: Secondary | ICD-10-CM

## 2015-03-08 NOTE — Patient Instructions (Signed)
1. Please call my office if you have any new breathing problems. 2. I will call you if your blood test today is abnormal. 3. Your primary care doctor can follow your enlarged mediastinal lymph node every 6 months to make sure it doesn't develop. 4. I will see you back on an as needed.

## 2015-03-08 NOTE — Progress Notes (Signed)
Subjective:    Patient ID: Jacob Shannon, male    DOB: [birth year], 74 y.o.   MRN: [id number]  C.C.: Follow-up for Pulmonary Hypertension, Vasculitis, Bilateral Pleural Effusions, & Mediastinal Adenopathy.  HPI Pulmonary Hypertension: Likely secondary to left ventricular diastolic dysfunction & mitral regurgitation. RVSP has mildly improved on diuretic therapy based on last echocardiogram. No near syncope or syncope.   Vasculitis: After last appointment patient had a flare of his lower extremity vasculitis and was treated with a prednisone taper. 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 He was also treated with a course of antibiotics as well. He reports there was no blistering of his leg but he has had some skin peeling.  Mediastinal Adenopathy: Likely secondary to diastolic congestive heart failure. Patient now only has a right peritracheal precarinal lymph node enlarged at 1.2 cm. No weight loss. No adenopathy in his neck, groin, or axilla.  Bilateral Pleural Effusions: Known diastolic congestive heart failure. No evidence of pleural effusion on repeat chest CT imaging.  Review of Systems He continues to have lower extremity edema. He denies any chest pain or pressure. No fever, chills, or sweats. No dysuria or hematuria. No urinary hesitancy.   No Known Allergies  Outpatient Encounter Prescriptions as of 03/08/2015  Medication Sig Note  . amLODipine (NORVASC) 5 MG tablet Take 5 mg by mouth daily.  02/08/2015: Received from: External Pharmacy  . DIGOX 125 MCG tablet Take 0.125 mg by mouth daily.  02/08/2015: Received from: External Pharmacy  . furosemide (LASIX) 40 MG tablet Take 40 mg by mouth daily.  02/08/2015: Received from: External Pharmacy  . losartan (COZAAR) 100 MG tablet Take 100 mg by mouth daily.  02/08/2015: Received from: External Pharmacy  . metoprolol succinate (TOPROL-XL) 100 MG 24 hr tablet Take 100 mg by mouth daily.  02/08/2015: Received from:  External Pharmacy  . potassium chloride SA (K-DUR,KLOR-CON) 20 MEQ tablet Take 20 mEq by mouth 2 (two) times daily.  02/08/2015: Received from: External Pharmacy  . PRADAXA 150 MG CAPS capsule Take 150 mg by mouth 2 (two) times daily.  02/08/2015: Received from: External Pharmacy   No facility-administered encounter medications on file as of 03/08/2015.   Past Medical History  Diagnosis Date  . Atrial fibrillation   . Gout   . Hypertension   . Spinal stenosis   . 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 Vasculitis   . Hypercholesterolemia    Past Surgical History  Procedure Laterality Date  . Leg skin lesion  biopsy / excision Right 2010  . Colonoscopy    . Cataract extraction Bilateral     Family History  Problem Relation Age of Onset  . Colon cancer Brother   . Coronary artery disease Brother   . Stomach cancer Brother   . Heart attack Father   . Coronary artery disease Father   . Lung disease Neg Hx   . Rheumatologic disease Neg Hx    Social History   Social History  . Marital Status: Married    Spouse Name: N/A  . Number of Children: 2  . Years of Education: N/A   Occupational History  . self employed      [employer]   Social History Main Topics  . Smoking status: Former Smoker -- 1.50 packs/day for 38 years    Types: Cigarettes    Start date: 06/18/1956    Quit date: 06/18/1994  .  Smokeless tobacco: None  . Alcohol Use: 0.0 oz/week    0 Standard drinks or equivalent per week     Comment: Beer on a weekly basis.  . Drug Use: No  . Sexual Activity: Not Asked   Other Topics Concern  . None   Social History Narrative   Originally from [place]. Always lived in [place]. Prior travel to [facility]. 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 No pets currently. No bird, asbestos, mold, or hot tub exposure. He enjoys fishing.       Objective:    Physical Exam Blood pressure 132/72, pulse 83, height 5' 7.5" (1.715 m), weight 210 lb 12.8 oz (95.618 kg), SpO2 98 %. General:  Awake. Alert. Caucasian male with mild central obesity.  Integument:  Warm & dry. Mild skin erythema and epidermal sloughing to the distal right lower extremity. Lymphatics:  No appreciated cervical or supraclavicular lymphadenoapthy. HEENT:  Moist mucus membranes. No oral ulcers. No scleral injection or icterus. Cardiovascular:  Regular rate but irregular rhythm. Pitting lower extremity edema bilaterally relatively unchanged. Pulmonary:  Mild crackles at the right lung base. Symmetric chest wall expansion. Normal work of breathing on room air. Musculoskeletal:  Normal bulk and tone. Hand grip strength 5/5 bilaterally. No joint deformity or effusion appreciated.  PFT  03/01/15: FVC 3.39 L (91%) FEV1 2.53 L (95%) FEV1/FVC 0.75 FEF 25-75 2.01 L (106%) no bronchodilator response TLC 7.54 L (117%) RV 170% ERV 102% DLCO uncorrected 72%  IMAGING CT CHEST W/O 02/15/15 (personally reviewed by me): No pleural effusion or thickening. Dependent platelike atelectasis noted. No parenchymal nodule or opacity appreciated. Right upper lobe subpleural bleb noted medially. Otherwise no appreciable emphysema. Mediastinal adenopathy noted but only precarinal lymph node at station 4R enlarged to 1.2 cm in short axis. Cardiomegaly noted.  CTA CHEST 10/07/14 (previously reviewed by me): Significant motion artifact. Cardiomegaly noted. Small bilateral pleural effusions. 1.1 cm precarinal lymph node & 1.3 cm subcarinal lymph node noted. Bilateral lower lung dependent opacities. No pulmonary nodule or mass appreciated. Lobular hepatic contour suggestive of cirrhosis. Enlarged gastrohepatic ligament lymph node measuring 1.6 cm by radiologist. Trace pericardial effusion. No pulmonary embolus.  CARDIAC TTE (02/15/15): Atrial fibrillation noted on EKG. Suboptimal views. LV normal in size with EF 50-55%.  No regional wall motion abnormalities. LA severely dilated. RA mildly dilated. RV normal in size and function. RVSP 49 mmHg. IVC dilated consistent with elevated right atrial pressure. No aortic stenosis or regurgitation. Moderate mitral regurgitation. Mild to moderate tricuspid regurgitation. No pulmonic regurgitation. No pericardial effusion.  TTE (10/07/14): Technically difficult study with suboptimal views. Mild concentric LVH with EF 60-65%. Without regional wall motion abnormality. LA & RA markedly enlarged. RV normal in size and function. RVSP 62 mmHg. IVC dilated. No pericardial effusion. Mild aortic valve sclerosis. Moderate mitral regurgitation. Moderate to severe tricuspid regurgitation. No pulmonic regurgitation.  PATHOLOGY 4MM SKIN PUNCH BX (11/04/08):  Leukocytoclasticperivascular infiltrate with fibrinoid change and focal subepidermal vesiculation. Favor leukocytoclastic vasculitis.  LABS 02/08/15 CRP: 1.2 ESR: 17 ANA: Negative Anti-Jo1:  <0.2 Anti-Centromere:  <0.2 Anti-double-stranded DNA: 1 RNP Antibody:  0.3 SSA:  <0.2 SSB:  <0.2 SCL-70:  <0.2 Anti-Chromatin Ab: <0.2  10/06/14 CBC: 19.2/13.4/41.0/119 BMP: 135/4.2/101/26/19/0.8/118/8.0 ABG (on 2 L/m): 7.42/33/71    Assessment & Plan:  74 year old male with diastolic congestive heart failure resulting in pulmonary hypertension and bilateral pleural effusions. The patient's bilateral  pleural effusions have resolved. He does have a mildly enlarged precarinal lymph node likely secondary to his diastolic congestive heart failure. 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 I do not favor a systemic vasculitis given blood work in this biopsy result. I instructed the patient contact my office if he develops any new breathing problems and will defer to his primary care provider to follow-up his enlarged mediastinal lymph node to ensure he does not  develop into something pathologic.  1. Pulmonary hypertension: Secondary to left ventricular dysfunction. No need for further testing. Recommend patient continue diuretic therapy. 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 I will contact the patient if this result is abnormal. No further testing necessary. 3. Mediastinal adenopathy: Recommend repeat CT scan of the chest without contrast in 6 months. If lymph node persists and remains enlarged I would recommend a repeat CT scan 6 months following and then if unchanged repeat imaging only for symptoms. 4. Follow-up: Patient will return to clinic on an as-needed basis.

## 2015-03-09 LAB — ANCA SCREEN W REFLEX TITER: ANCA Screen: NEGATIVE

## 2015-03-14 ENCOUNTER — Encounter: Payer: Self-pay | Admitting: Pulmonary Disease

## 2015-03-14 ENCOUNTER — Telehealth: Payer: Self-pay | Admitting: Critical Care Medicine

## 2015-03-14 NOTE — Telephone Encounter (Signed)
Opened in error

## 2015-04-04 DIAGNOSIS — J189 Pneumonia, unspecified organism: Secondary | ICD-10-CM | POA: Diagnosis not present

## 2015-04-25 DIAGNOSIS — R1031 Right lower quadrant pain: Secondary | ICD-10-CM | POA: Diagnosis not present

## 2015-04-25 DIAGNOSIS — Z6834 Body mass index (BMI) 34.0-34.9, adult: Secondary | ICD-10-CM | POA: Diagnosis not present

## 2015-05-04 DIAGNOSIS — Z85828 Personal history of other malignant neoplasm of skin: Secondary | ICD-10-CM | POA: Diagnosis not present

## 2015-05-04 DIAGNOSIS — Z6831 Body mass index (BMI) 31.0-31.9, adult: Secondary | ICD-10-CM | POA: Diagnosis not present

## 2015-05-04 DIAGNOSIS — R0602 Shortness of breath: Secondary | ICD-10-CM | POA: Diagnosis not present

## 2015-05-04 DIAGNOSIS — M109 Gout, unspecified: Secondary | ICD-10-CM | POA: Diagnosis not present

## 2015-05-04 DIAGNOSIS — J95821 Acute postprocedural respiratory failure: Secondary | ICD-10-CM | POA: Diagnosis not present

## 2015-05-04 DIAGNOSIS — I9581 Postprocedural hypotension: Secondary | ICD-10-CM | POA: Diagnosis not present

## 2015-05-04 DIAGNOSIS — Z9049 Acquired absence of other specified parts of digestive tract: Secondary | ICD-10-CM | POA: Diagnosis not present

## 2015-05-04 DIAGNOSIS — K352 Acute appendicitis with generalized peritonitis: Secondary | ICD-10-CM | POA: Diagnosis not present

## 2015-05-04 DIAGNOSIS — K660 Peritoneal adhesions (postprocedural) (postinfection): Secondary | ICD-10-CM | POA: Diagnosis not present

## 2015-05-04 DIAGNOSIS — R140 Abdominal distension (gaseous): Secondary | ICD-10-CM | POA: Diagnosis not present

## 2015-05-04 DIAGNOSIS — R112 Nausea with vomiting, unspecified: Secondary | ICD-10-CM | POA: Diagnosis not present

## 2015-05-04 DIAGNOSIS — J9601 Acute respiratory failure with hypoxia: Secondary | ICD-10-CM | POA: Diagnosis not present

## 2015-05-04 DIAGNOSIS — R1031 Right lower quadrant pain: Secondary | ICD-10-CM | POA: Diagnosis not present

## 2015-05-04 DIAGNOSIS — K5669 Other intestinal obstruction: Secondary | ICD-10-CM | POA: Diagnosis not present

## 2015-05-04 DIAGNOSIS — R188 Other ascites: Secondary | ICD-10-CM | POA: Diagnosis not present

## 2015-05-04 DIAGNOSIS — K37 Unspecified appendicitis: Secondary | ICD-10-CM | POA: Diagnosis not present

## 2015-05-04 DIAGNOSIS — D72829 Elevated white blood cell count, unspecified: Secondary | ICD-10-CM | POA: Diagnosis not present

## 2015-05-04 DIAGNOSIS — J189 Pneumonia, unspecified organism: Secondary | ICD-10-CM | POA: Diagnosis not present

## 2015-05-04 DIAGNOSIS — Z452 Encounter for adjustment and management of vascular access device: Secondary | ICD-10-CM | POA: Diagnosis not present

## 2015-05-04 DIAGNOSIS — K353 Acute appendicitis with localized peritonitis: Secondary | ICD-10-CM | POA: Diagnosis not present

## 2015-05-04 DIAGNOSIS — Z4682 Encounter for fitting and adjustment of non-vascular catheter: Secondary | ICD-10-CM | POA: Diagnosis not present

## 2015-05-04 DIAGNOSIS — I7102 Dissection of abdominal aorta: Secondary | ICD-10-CM | POA: Diagnosis not present

## 2015-05-04 DIAGNOSIS — R001 Bradycardia, unspecified: Secondary | ICD-10-CM | POA: Diagnosis not present

## 2015-05-04 DIAGNOSIS — Z7902 Long term (current) use of antithrombotics/antiplatelets: Secondary | ICD-10-CM | POA: Diagnosis not present

## 2015-05-04 DIAGNOSIS — J449 Chronic obstructive pulmonary disease, unspecified: Secondary | ICD-10-CM | POA: Diagnosis not present

## 2015-05-04 DIAGNOSIS — K6389 Other specified diseases of intestine: Secondary | ICD-10-CM | POA: Diagnosis not present

## 2015-05-04 DIAGNOSIS — I10 Essential (primary) hypertension: Secondary | ICD-10-CM | POA: Diagnosis not present

## 2015-05-04 DIAGNOSIS — K567 Ileus, unspecified: Secondary | ICD-10-CM | POA: Diagnosis not present

## 2015-05-04 DIAGNOSIS — R509 Fever, unspecified: Secondary | ICD-10-CM | POA: Diagnosis not present

## 2015-05-04 DIAGNOSIS — I509 Heart failure, unspecified: Secondary | ICD-10-CM | POA: Diagnosis not present

## 2015-05-04 DIAGNOSIS — I472 Ventricular tachycardia: Secondary | ICD-10-CM | POA: Diagnosis not present

## 2015-05-04 DIAGNOSIS — K651 Peritoneal abscess: Secondary | ICD-10-CM | POA: Diagnosis not present

## 2015-05-04 DIAGNOSIS — I482 Chronic atrial fibrillation: Secondary | ICD-10-CM | POA: Diagnosis present

## 2015-05-04 DIAGNOSIS — Z23 Encounter for immunization: Secondary | ICD-10-CM | POA: Diagnosis not present

## 2015-05-04 DIAGNOSIS — I4891 Unspecified atrial fibrillation: Secondary | ICD-10-CM | POA: Diagnosis not present

## 2015-05-04 DIAGNOSIS — E669 Obesity, unspecified: Secondary | ICD-10-CM | POA: Diagnosis not present

## 2015-05-04 DIAGNOSIS — Z87891 Personal history of nicotine dependence: Secondary | ICD-10-CM | POA: Diagnosis not present

## 2015-05-20 DIAGNOSIS — Z7901 Long term (current) use of anticoagulants: Secondary | ICD-10-CM | POA: Diagnosis not present

## 2015-05-20 DIAGNOSIS — I10 Essential (primary) hypertension: Secondary | ICD-10-CM | POA: Diagnosis not present

## 2015-05-20 DIAGNOSIS — I4891 Unspecified atrial fibrillation: Secondary | ICD-10-CM | POA: Diagnosis not present

## 2015-05-20 DIAGNOSIS — Z48815 Encounter for surgical aftercare following surgery on the digestive system: Secondary | ICD-10-CM | POA: Diagnosis not present

## 2015-05-20 DIAGNOSIS — Z4801 Encounter for change or removal of surgical wound dressing: Secondary | ICD-10-CM | POA: Diagnosis not present

## 2015-05-20 DIAGNOSIS — R2689 Other abnormalities of gait and mobility: Secondary | ICD-10-CM | POA: Diagnosis not present

## 2015-05-23 DIAGNOSIS — Z7901 Long term (current) use of anticoagulants: Secondary | ICD-10-CM | POA: Diagnosis not present

## 2015-05-23 DIAGNOSIS — I4891 Unspecified atrial fibrillation: Secondary | ICD-10-CM | POA: Diagnosis not present

## 2015-05-23 DIAGNOSIS — I10 Essential (primary) hypertension: Secondary | ICD-10-CM | POA: Diagnosis not present

## 2015-05-23 DIAGNOSIS — Z4801 Encounter for change or removal of surgical wound dressing: Secondary | ICD-10-CM | POA: Diagnosis not present

## 2015-05-23 DIAGNOSIS — Z48815 Encounter for surgical aftercare following surgery on the digestive system: Secondary | ICD-10-CM | POA: Diagnosis not present

## 2015-05-23 DIAGNOSIS — R2689 Other abnormalities of gait and mobility: Secondary | ICD-10-CM | POA: Diagnosis not present

## 2015-05-24 DIAGNOSIS — T888XXA Other specified complications of surgical and medical care, not elsewhere classified, initial encounter: Secondary | ICD-10-CM | POA: Diagnosis not present

## 2015-05-24 DIAGNOSIS — T792XXA Traumatic secondary and recurrent hemorrhage and seroma, initial encounter: Secondary | ICD-10-CM | POA: Diagnosis not present

## 2015-05-25 DIAGNOSIS — I4891 Unspecified atrial fibrillation: Secondary | ICD-10-CM | POA: Diagnosis not present

## 2015-05-25 DIAGNOSIS — I10 Essential (primary) hypertension: Secondary | ICD-10-CM | POA: Diagnosis not present

## 2015-05-25 DIAGNOSIS — Z4801 Encounter for change or removal of surgical wound dressing: Secondary | ICD-10-CM | POA: Diagnosis not present

## 2015-05-25 DIAGNOSIS — Z48815 Encounter for surgical aftercare following surgery on the digestive system: Secondary | ICD-10-CM | POA: Diagnosis not present

## 2015-05-25 DIAGNOSIS — R2689 Other abnormalities of gait and mobility: Secondary | ICD-10-CM | POA: Diagnosis not present

## 2015-05-25 DIAGNOSIS — R609 Edema, unspecified: Secondary | ICD-10-CM | POA: Diagnosis not present

## 2015-05-25 DIAGNOSIS — Z79899 Other long term (current) drug therapy: Secondary | ICD-10-CM | POA: Diagnosis not present

## 2015-05-25 DIAGNOSIS — K352 Acute appendicitis with generalized peritonitis: Secondary | ICD-10-CM | POA: Diagnosis not present

## 2015-05-25 DIAGNOSIS — Z7901 Long term (current) use of anticoagulants: Secondary | ICD-10-CM | POA: Diagnosis not present

## 2015-05-25 DIAGNOSIS — T8189XA Other complications of procedures, not elsewhere classified, initial encounter: Secondary | ICD-10-CM | POA: Diagnosis not present

## 2015-05-27 DIAGNOSIS — Z48815 Encounter for surgical aftercare following surgery on the digestive system: Secondary | ICD-10-CM | POA: Diagnosis not present

## 2015-05-27 DIAGNOSIS — I4891 Unspecified atrial fibrillation: Secondary | ICD-10-CM | POA: Diagnosis not present

## 2015-05-27 DIAGNOSIS — R2689 Other abnormalities of gait and mobility: Secondary | ICD-10-CM | POA: Diagnosis not present

## 2015-05-27 DIAGNOSIS — I10 Essential (primary) hypertension: Secondary | ICD-10-CM | POA: Diagnosis not present

## 2015-05-27 DIAGNOSIS — Z7901 Long term (current) use of anticoagulants: Secondary | ICD-10-CM | POA: Diagnosis not present

## 2015-05-27 DIAGNOSIS — Z4801 Encounter for change or removal of surgical wound dressing: Secondary | ICD-10-CM | POA: Diagnosis not present

## 2015-05-30 DIAGNOSIS — Z4801 Encounter for change or removal of surgical wound dressing: Secondary | ICD-10-CM | POA: Diagnosis not present

## 2015-05-30 DIAGNOSIS — I4891 Unspecified atrial fibrillation: Secondary | ICD-10-CM | POA: Diagnosis not present

## 2015-05-30 DIAGNOSIS — I10 Essential (primary) hypertension: Secondary | ICD-10-CM | POA: Diagnosis not present

## 2015-05-30 DIAGNOSIS — Z7901 Long term (current) use of anticoagulants: Secondary | ICD-10-CM | POA: Diagnosis not present

## 2015-05-30 DIAGNOSIS — Z48815 Encounter for surgical aftercare following surgery on the digestive system: Secondary | ICD-10-CM | POA: Diagnosis not present

## 2015-05-30 DIAGNOSIS — R2689 Other abnormalities of gait and mobility: Secondary | ICD-10-CM | POA: Diagnosis not present

## 2015-06-01 DIAGNOSIS — I10 Essential (primary) hypertension: Secondary | ICD-10-CM | POA: Diagnosis not present

## 2015-06-01 DIAGNOSIS — R2689 Other abnormalities of gait and mobility: Secondary | ICD-10-CM | POA: Diagnosis not present

## 2015-06-01 DIAGNOSIS — Z7901 Long term (current) use of anticoagulants: Secondary | ICD-10-CM | POA: Diagnosis not present

## 2015-06-01 DIAGNOSIS — I4891 Unspecified atrial fibrillation: Secondary | ICD-10-CM | POA: Diagnosis not present

## 2015-06-01 DIAGNOSIS — Z4801 Encounter for change or removal of surgical wound dressing: Secondary | ICD-10-CM | POA: Diagnosis not present

## 2015-06-01 DIAGNOSIS — Z48815 Encounter for surgical aftercare following surgery on the digestive system: Secondary | ICD-10-CM | POA: Diagnosis not present

## 2015-06-02 DIAGNOSIS — I10 Essential (primary) hypertension: Secondary | ICD-10-CM | POA: Diagnosis not present

## 2015-06-02 DIAGNOSIS — Z48815 Encounter for surgical aftercare following surgery on the digestive system: Secondary | ICD-10-CM | POA: Diagnosis not present

## 2015-06-02 DIAGNOSIS — Z4801 Encounter for change or removal of surgical wound dressing: Secondary | ICD-10-CM | POA: Diagnosis not present

## 2015-06-02 DIAGNOSIS — Z7901 Long term (current) use of anticoagulants: Secondary | ICD-10-CM | POA: Diagnosis not present

## 2015-06-02 DIAGNOSIS — R2689 Other abnormalities of gait and mobility: Secondary | ICD-10-CM | POA: Diagnosis not present

## 2015-06-02 DIAGNOSIS — I4891 Unspecified atrial fibrillation: Secondary | ICD-10-CM | POA: Diagnosis not present

## 2015-06-03 DIAGNOSIS — I10 Essential (primary) hypertension: Secondary | ICD-10-CM | POA: Diagnosis not present

## 2015-06-03 DIAGNOSIS — R2689 Other abnormalities of gait and mobility: Secondary | ICD-10-CM | POA: Diagnosis not present

## 2015-06-03 DIAGNOSIS — Z7901 Long term (current) use of anticoagulants: Secondary | ICD-10-CM | POA: Diagnosis not present

## 2015-06-03 DIAGNOSIS — I4891 Unspecified atrial fibrillation: Secondary | ICD-10-CM | POA: Diagnosis not present

## 2015-06-03 DIAGNOSIS — Z48815 Encounter for surgical aftercare following surgery on the digestive system: Secondary | ICD-10-CM | POA: Diagnosis not present

## 2015-06-03 DIAGNOSIS — Z4801 Encounter for change or removal of surgical wound dressing: Secondary | ICD-10-CM | POA: Diagnosis not present

## 2015-06-06 DIAGNOSIS — Z4801 Encounter for change or removal of surgical wound dressing: Secondary | ICD-10-CM | POA: Diagnosis not present

## 2015-06-06 DIAGNOSIS — I4891 Unspecified atrial fibrillation: Secondary | ICD-10-CM | POA: Diagnosis not present

## 2015-06-06 DIAGNOSIS — I10 Essential (primary) hypertension: Secondary | ICD-10-CM | POA: Diagnosis not present

## 2015-06-06 DIAGNOSIS — Z7901 Long term (current) use of anticoagulants: Secondary | ICD-10-CM | POA: Diagnosis not present

## 2015-06-06 DIAGNOSIS — R2689 Other abnormalities of gait and mobility: Secondary | ICD-10-CM | POA: Diagnosis not present

## 2015-06-06 DIAGNOSIS — Z48815 Encounter for surgical aftercare following surgery on the digestive system: Secondary | ICD-10-CM | POA: Diagnosis not present

## 2015-06-07 DIAGNOSIS — Z48815 Encounter for surgical aftercare following surgery on the digestive system: Secondary | ICD-10-CM | POA: Diagnosis not present

## 2015-06-07 DIAGNOSIS — R2689 Other abnormalities of gait and mobility: Secondary | ICD-10-CM | POA: Diagnosis not present

## 2015-06-07 DIAGNOSIS — I10 Essential (primary) hypertension: Secondary | ICD-10-CM | POA: Diagnosis not present

## 2015-06-07 DIAGNOSIS — Z7901 Long term (current) use of anticoagulants: Secondary | ICD-10-CM | POA: Diagnosis not present

## 2015-06-07 DIAGNOSIS — I4891 Unspecified atrial fibrillation: Secondary | ICD-10-CM | POA: Diagnosis not present

## 2015-06-07 DIAGNOSIS — Z4801 Encounter for change or removal of surgical wound dressing: Secondary | ICD-10-CM | POA: Diagnosis not present

## 2015-06-08 DIAGNOSIS — I10 Essential (primary) hypertension: Secondary | ICD-10-CM | POA: Diagnosis not present

## 2015-06-08 DIAGNOSIS — Z4801 Encounter for change or removal of surgical wound dressing: Secondary | ICD-10-CM | POA: Diagnosis not present

## 2015-06-08 DIAGNOSIS — R2689 Other abnormalities of gait and mobility: Secondary | ICD-10-CM | POA: Diagnosis not present

## 2015-06-08 DIAGNOSIS — Z48815 Encounter for surgical aftercare following surgery on the digestive system: Secondary | ICD-10-CM | POA: Diagnosis not present

## 2015-06-08 DIAGNOSIS — I4891 Unspecified atrial fibrillation: Secondary | ICD-10-CM | POA: Diagnosis not present

## 2015-06-08 DIAGNOSIS — Z7901 Long term (current) use of anticoagulants: Secondary | ICD-10-CM | POA: Diagnosis not present

## 2015-06-09 DIAGNOSIS — I10 Essential (primary) hypertension: Secondary | ICD-10-CM | POA: Diagnosis not present

## 2015-06-09 DIAGNOSIS — R2689 Other abnormalities of gait and mobility: Secondary | ICD-10-CM | POA: Diagnosis not present

## 2015-06-09 DIAGNOSIS — I4891 Unspecified atrial fibrillation: Secondary | ICD-10-CM | POA: Diagnosis not present

## 2015-06-09 DIAGNOSIS — Z7901 Long term (current) use of anticoagulants: Secondary | ICD-10-CM | POA: Diagnosis not present

## 2015-06-09 DIAGNOSIS — Z48815 Encounter for surgical aftercare following surgery on the digestive system: Secondary | ICD-10-CM | POA: Diagnosis not present

## 2015-06-09 DIAGNOSIS — Z4801 Encounter for change or removal of surgical wound dressing: Secondary | ICD-10-CM | POA: Diagnosis not present

## 2015-06-14 DIAGNOSIS — R2689 Other abnormalities of gait and mobility: Secondary | ICD-10-CM | POA: Diagnosis not present

## 2015-06-14 DIAGNOSIS — Z7901 Long term (current) use of anticoagulants: Secondary | ICD-10-CM | POA: Diagnosis not present

## 2015-06-14 DIAGNOSIS — I4891 Unspecified atrial fibrillation: Secondary | ICD-10-CM | POA: Diagnosis not present

## 2015-06-14 DIAGNOSIS — Z48815 Encounter for surgical aftercare following surgery on the digestive system: Secondary | ICD-10-CM | POA: Diagnosis not present

## 2015-06-14 DIAGNOSIS — I10 Essential (primary) hypertension: Secondary | ICD-10-CM | POA: Diagnosis not present

## 2015-06-14 DIAGNOSIS — Z4801 Encounter for change or removal of surgical wound dressing: Secondary | ICD-10-CM | POA: Diagnosis not present

## 2015-06-15 DIAGNOSIS — I4891 Unspecified atrial fibrillation: Secondary | ICD-10-CM | POA: Diagnosis not present

## 2015-06-15 DIAGNOSIS — R2689 Other abnormalities of gait and mobility: Secondary | ICD-10-CM | POA: Diagnosis not present

## 2015-06-15 DIAGNOSIS — Z7901 Long term (current) use of anticoagulants: Secondary | ICD-10-CM | POA: Diagnosis not present

## 2015-06-15 DIAGNOSIS — Z48815 Encounter for surgical aftercare following surgery on the digestive system: Secondary | ICD-10-CM | POA: Diagnosis not present

## 2015-06-15 DIAGNOSIS — Z4801 Encounter for change or removal of surgical wound dressing: Secondary | ICD-10-CM | POA: Diagnosis not present

## 2015-06-15 DIAGNOSIS — I10 Essential (primary) hypertension: Secondary | ICD-10-CM | POA: Diagnosis not present

## 2015-06-17 DIAGNOSIS — Z4801 Encounter for change or removal of surgical wound dressing: Secondary | ICD-10-CM | POA: Diagnosis not present

## 2015-06-17 DIAGNOSIS — I10 Essential (primary) hypertension: Secondary | ICD-10-CM | POA: Diagnosis not present

## 2015-06-17 DIAGNOSIS — Z7901 Long term (current) use of anticoagulants: Secondary | ICD-10-CM | POA: Diagnosis not present

## 2015-06-17 DIAGNOSIS — I4891 Unspecified atrial fibrillation: Secondary | ICD-10-CM | POA: Diagnosis not present

## 2015-06-17 DIAGNOSIS — Z48815 Encounter for surgical aftercare following surgery on the digestive system: Secondary | ICD-10-CM | POA: Diagnosis not present

## 2015-06-17 DIAGNOSIS — R2689 Other abnormalities of gait and mobility: Secondary | ICD-10-CM | POA: Diagnosis not present

## 2015-06-21 DIAGNOSIS — Z4801 Encounter for change or removal of surgical wound dressing: Secondary | ICD-10-CM | POA: Diagnosis not present

## 2015-06-21 DIAGNOSIS — I10 Essential (primary) hypertension: Secondary | ICD-10-CM | POA: Diagnosis not present

## 2015-06-21 DIAGNOSIS — Z7901 Long term (current) use of anticoagulants: Secondary | ICD-10-CM | POA: Diagnosis not present

## 2015-06-21 DIAGNOSIS — I4891 Unspecified atrial fibrillation: Secondary | ICD-10-CM | POA: Diagnosis not present

## 2015-06-21 DIAGNOSIS — R2689 Other abnormalities of gait and mobility: Secondary | ICD-10-CM | POA: Diagnosis not present

## 2015-06-21 DIAGNOSIS — Z48815 Encounter for surgical aftercare following surgery on the digestive system: Secondary | ICD-10-CM | POA: Diagnosis not present

## 2015-06-22 DIAGNOSIS — R2689 Other abnormalities of gait and mobility: Secondary | ICD-10-CM | POA: Diagnosis not present

## 2015-06-22 DIAGNOSIS — I4891 Unspecified atrial fibrillation: Secondary | ICD-10-CM | POA: Diagnosis not present

## 2015-06-22 DIAGNOSIS — I10 Essential (primary) hypertension: Secondary | ICD-10-CM | POA: Diagnosis not present

## 2015-06-22 DIAGNOSIS — Z7901 Long term (current) use of anticoagulants: Secondary | ICD-10-CM | POA: Diagnosis not present

## 2015-06-22 DIAGNOSIS — Z48815 Encounter for surgical aftercare following surgery on the digestive system: Secondary | ICD-10-CM | POA: Diagnosis not present

## 2015-06-22 DIAGNOSIS — Z4801 Encounter for change or removal of surgical wound dressing: Secondary | ICD-10-CM | POA: Diagnosis not present

## 2015-06-23 DIAGNOSIS — M109 Gout, unspecified: Secondary | ICD-10-CM | POA: Diagnosis not present

## 2015-06-23 DIAGNOSIS — E779 Disorder of glycoprotein metabolism, unspecified: Secondary | ICD-10-CM | POA: Diagnosis not present

## 2015-06-23 DIAGNOSIS — Z6834 Body mass index (BMI) 34.0-34.9, adult: Secondary | ICD-10-CM | POA: Diagnosis not present

## 2015-06-23 DIAGNOSIS — D649 Anemia, unspecified: Secondary | ICD-10-CM | POA: Diagnosis not present

## 2015-06-23 DIAGNOSIS — I10 Essential (primary) hypertension: Secondary | ICD-10-CM | POA: Diagnosis not present

## 2015-06-23 DIAGNOSIS — E778 Other disorders of glycoprotein metabolism: Secondary | ICD-10-CM | POA: Diagnosis not present

## 2015-06-23 DIAGNOSIS — T8189XA Other complications of procedures, not elsewhere classified, initial encounter: Secondary | ICD-10-CM | POA: Diagnosis not present

## 2015-06-24 DIAGNOSIS — Z7901 Long term (current) use of anticoagulants: Secondary | ICD-10-CM | POA: Diagnosis not present

## 2015-06-24 DIAGNOSIS — I10 Essential (primary) hypertension: Secondary | ICD-10-CM | POA: Diagnosis not present

## 2015-06-24 DIAGNOSIS — Z48815 Encounter for surgical aftercare following surgery on the digestive system: Secondary | ICD-10-CM | POA: Diagnosis not present

## 2015-06-24 DIAGNOSIS — Z4801 Encounter for change or removal of surgical wound dressing: Secondary | ICD-10-CM | POA: Diagnosis not present

## 2015-06-24 DIAGNOSIS — I4891 Unspecified atrial fibrillation: Secondary | ICD-10-CM | POA: Diagnosis not present

## 2015-06-24 DIAGNOSIS — R2689 Other abnormalities of gait and mobility: Secondary | ICD-10-CM | POA: Diagnosis not present

## 2015-06-28 DIAGNOSIS — I10 Essential (primary) hypertension: Secondary | ICD-10-CM | POA: Diagnosis not present

## 2015-06-28 DIAGNOSIS — M109 Gout, unspecified: Secondary | ICD-10-CM | POA: Diagnosis not present

## 2015-06-28 DIAGNOSIS — I4891 Unspecified atrial fibrillation: Secondary | ICD-10-CM | POA: Diagnosis not present

## 2015-07-15 DIAGNOSIS — Z09 Encounter for follow-up examination after completed treatment for conditions other than malignant neoplasm: Secondary | ICD-10-CM | POA: Insufficient documentation

## 2015-07-24 ENCOUNTER — Observation Stay (HOSPITAL_COMMUNITY)
Admission: EM | Admit: 2015-07-24 | Discharge: 2015-07-26 | Disposition: A | Discharge status: Home / Self Care | Payer: Medicare Other | Attending: Internal Medicine | Admitting: Internal Medicine

## 2015-07-24 ENCOUNTER — Emergency Department (HOSPITAL_COMMUNITY): Payer: Medicare Other

## 2015-07-24 ENCOUNTER — Encounter (HOSPITAL_COMMUNITY): Payer: Self-pay | Admitting: Emergency Medicine

## 2015-07-24 DIAGNOSIS — R0789 Other chest pain: Principal | ICD-10-CM | POA: Insufficient documentation

## 2015-07-24 DIAGNOSIS — I10 Essential (primary) hypertension: Secondary | ICD-10-CM | POA: Diagnosis not present

## 2015-07-24 DIAGNOSIS — E785 Hyperlipidemia, unspecified: Secondary | ICD-10-CM | POA: Insufficient documentation

## 2015-07-24 DIAGNOSIS — R748 Abnormal levels of other serum enzymes: Secondary | ICD-10-CM | POA: Diagnosis not present

## 2015-07-24 DIAGNOSIS — I272 Other secondary pulmonary hypertension: Secondary | ICD-10-CM

## 2015-07-24 DIAGNOSIS — Z9049 Acquired absence of other specified parts of digestive tract: Secondary | ICD-10-CM | POA: Insufficient documentation

## 2015-07-24 DIAGNOSIS — R739 Hyperglycemia, unspecified: Secondary | ICD-10-CM | POA: Diagnosis not present

## 2015-07-24 DIAGNOSIS — R61 Generalized hyperhidrosis: Secondary | ICD-10-CM | POA: Diagnosis not present

## 2015-07-24 DIAGNOSIS — Z7901 Long term (current) use of anticoagulants: Secondary | ICD-10-CM | POA: Insufficient documentation

## 2015-07-24 DIAGNOSIS — R112 Nausea with vomiting, unspecified: Secondary | ICD-10-CM | POA: Insufficient documentation

## 2015-07-24 DIAGNOSIS — M109 Gout, unspecified: Secondary | ICD-10-CM | POA: Insufficient documentation

## 2015-07-24 DIAGNOSIS — R42 Dizziness and giddiness: Secondary | ICD-10-CM | POA: Insufficient documentation

## 2015-07-24 DIAGNOSIS — I482 Chronic atrial fibrillation: Secondary | ICD-10-CM | POA: Insufficient documentation

## 2015-07-24 DIAGNOSIS — M310 Hypersensitivity angiitis: Secondary | ICD-10-CM | POA: Diagnosis not present

## 2015-07-24 DIAGNOSIS — E7800 Pure hypercholesterolemia, unspecified: Secondary | ICD-10-CM | POA: Diagnosis not present

## 2015-07-24 DIAGNOSIS — R079 Chest pain, unspecified: Secondary | ICD-10-CM | POA: Diagnosis present

## 2015-07-24 DIAGNOSIS — Z87891 Personal history of nicotine dependence: Secondary | ICD-10-CM | POA: Diagnosis not present

## 2015-07-24 DIAGNOSIS — R0602 Shortness of breath: Secondary | ICD-10-CM | POA: Insufficient documentation

## 2015-07-24 DIAGNOSIS — I4891 Unspecified atrial fibrillation: Secondary | ICD-10-CM | POA: Diagnosis not present

## 2015-07-24 DIAGNOSIS — I341 Nonrheumatic mitral (valve) prolapse: Secondary | ICD-10-CM | POA: Diagnosis present

## 2015-07-24 DIAGNOSIS — I340 Nonrheumatic mitral (valve) insufficiency: Secondary | ICD-10-CM | POA: Diagnosis not present

## 2015-07-24 DIAGNOSIS — R404 Transient alteration of awareness: Secondary | ICD-10-CM | POA: Diagnosis not present

## 2015-07-24 DIAGNOSIS — I700 Atherosclerosis of aorta: Secondary | ICD-10-CM | POA: Diagnosis not present

## 2015-07-24 DIAGNOSIS — I5032 Chronic diastolic (congestive) heart failure: Secondary | ICD-10-CM | POA: Diagnosis not present

## 2015-07-24 DIAGNOSIS — R001 Bradycardia, unspecified: Secondary | ICD-10-CM | POA: Diagnosis not present

## 2015-07-24 DIAGNOSIS — R1013 Epigastric pain: Secondary | ICD-10-CM | POA: Diagnosis not present

## 2015-07-24 DIAGNOSIS — R778 Other specified abnormalities of plasma proteins: Secondary | ICD-10-CM | POA: Insufficient documentation

## 2015-07-24 DIAGNOSIS — R531 Weakness: Secondary | ICD-10-CM | POA: Diagnosis not present

## 2015-07-24 DIAGNOSIS — R7989 Other specified abnormal findings of blood chemistry: Secondary | ICD-10-CM

## 2015-07-24 HISTORY — DX: Unspecified diastolic (congestive) heart failure: I50.30

## 2015-07-24 HISTORY — DX: Reserved for concepts with insufficient information to code with codable children: IMO0002

## 2015-07-24 HISTORY — DX: Chronic atrial fibrillation, unspecified: I48.20

## 2015-07-24 HISTORY — DX: Nonrheumatic mitral (valve) insufficiency: I34.0

## 2015-07-24 HISTORY — DX: Hypersensitivity angiitis: M31.0

## 2015-07-24 HISTORY — DX: Essential (primary) hypertension: I10

## 2015-07-24 LAB — CBC WITH DIFFERENTIAL/PLATELET
Basophils Absolute: 0 10*3/uL (ref 0.0–0.1)
Basophils Relative: 0 %
EOS PCT: 1 %
Eosinophils Absolute: 0.1 10*3/uL (ref 0.0–0.7)
HCT: 41.3 % (ref 39.0–52.0)
Hemoglobin: 13.5 g/dL (ref 13.0–17.0)
LYMPHS ABS: 1.3 10*3/uL (ref 0.7–4.0)
LYMPHS PCT: 12 %
MCH: 30.7 pg (ref 26.0–34.0)
MCHC: 32.7 g/dL (ref 30.0–36.0)
MCV: 93.9 fL (ref 78.0–100.0)
MONO ABS: 0.7 10*3/uL (ref 0.1–1.0)
Monocytes Relative: 7 %
Neutro Abs: 8.6 10*3/uL — ABNORMAL HIGH (ref 1.7–7.7)
Neutrophils Relative %: 80 %
PLATELETS: 173 10*3/uL (ref 150–400)
RBC: 4.4 MIL/uL (ref 4.22–5.81)
RDW: 17 % — ABNORMAL HIGH (ref 11.5–15.5)
WBC: 10.8 10*3/uL — AB (ref 4.0–10.5)

## 2015-07-24 LAB — COMPREHENSIVE METABOLIC PANEL
ALK PHOS: 56 U/L (ref 38–126)
ALT: 21 U/L (ref 17–63)
AST: 24 U/L (ref 15–41)
Albumin: 3.8 g/dL (ref 3.5–5.0)
Anion gap: 14 (ref 5–15)
BUN: 20 mg/dL (ref 6–20)
CHLORIDE: 102 mmol/L (ref 101–111)
CO2: 25 mmol/L (ref 22–32)
Calcium: 8.9 mg/dL (ref 8.9–10.3)
Creatinine, Ser: 0.84 mg/dL (ref 0.61–1.24)
GFR calc Af Amer: >60 mL/min (ref >60)
GFR calc non Af Amer: >60 mL/min (ref >60)
GLUCOSE: 156 mg/dL — AB (ref 65–99)
POTASSIUM: 3.7 mmol/L (ref 3.5–5.1)
Sodium: 141 mmol/L (ref 135–145)
Total Bilirubin: 1 mg/dL (ref 0.3–1.2)
Total Protein: 6.9 g/dL (ref 6.5–8.1)

## 2015-07-24 LAB — URINALYSIS, ROUTINE W REFLEX MICROSCOPIC
BILIRUBIN URINE: NEGATIVE
Glucose, UA: NEGATIVE mg/dL
Ketones, ur: NEGATIVE mg/dL
Leukocytes, UA: NEGATIVE
NITRITE: NEGATIVE
Protein, ur: NEGATIVE mg/dL
SPECIFIC GRAVITY, URINE: 1.007 (ref 1.005–1.030)
pH: 7 (ref 5.0–8.0)

## 2015-07-24 LAB — TSH: TSH: 1.87 u[IU]/mL (ref 0.350–4.500)

## 2015-07-24 LAB — GLUCOSE, CAPILLARY
Glucose-Capillary: 89 mg/dL (ref 65–99)
Glucose-Capillary: 104 mg/dL — ABNORMAL HIGH (ref 65–99)

## 2015-07-24 LAB — URINE MICROSCOPIC-ADD ON
Bacteria, UA: NONE SEEN
RBC / HPF: NONE SEEN RBC/hpf (ref 0–5)
Squamous Epithelial / LPF: NONE SEEN
WBC, UA: NONE SEEN WBC/hpf (ref 0–5)

## 2015-07-24 LAB — TROPONIN I
TROPONIN I: 0.08 ng/mL — AB (ref <0.031)
Troponin I: 0.07 ng/mL — ABNORMAL HIGH (ref <0.031)

## 2015-07-24 LAB — I-STAT TROPONIN, ED: Troponin i, poc: 0.03 ng/mL (ref 0.00–0.08)

## 2015-07-24 LAB — LIPASE, BLOOD: LIPASE: 31 U/L (ref 11–51)

## 2015-07-24 MED ORDER — PANTOPRAZOLE SODIUM 40 MG IV SOLR
40.0000 mg | Freq: Once | INTRAVENOUS | Status: AC
  Administered 2015-07-24: 40 mg via INTRAVENOUS
  Filled 2015-07-24: qty 40

## 2015-07-24 MED ORDER — LOSARTAN POTASSIUM 50 MG PO TABS
100.0000 mg | ORAL_TABLET | Freq: Every day | ORAL | Status: DC
  Administered 2015-07-25 – 2015-07-26 (×2): 100 mg via ORAL
  Filled 2015-07-24 (×2): qty 2

## 2015-07-24 MED ORDER — MORPHINE SULFATE (PF) 2 MG/ML IV SOLN: 1.0000 mg | INTRAVENOUS | Status: DC | PRN

## 2015-07-24 MED ORDER — SODIUM CHLORIDE 0.9% FLUSH
3.0000 mL | Freq: Two times a day (BID) | INTRAVENOUS | Status: DC
  Administered 2015-07-24 – 2015-07-25 (×3): 3 mL via INTRAVENOUS

## 2015-07-24 MED ORDER — INSULIN ASPART 100 UNIT/ML ~~LOC~~ SOLN: 0.0000 [IU] | Freq: Every day | SUBCUTANEOUS | Status: DC

## 2015-07-24 MED ORDER — ONDANSETRON HCL 4 MG/2ML IJ SOLN: 4.0000 mg | Freq: Four times a day (QID) | INTRAMUSCULAR | Status: DC | PRN

## 2015-07-24 MED ORDER — ONDANSETRON HCL 4 MG/2ML IJ SOLN: 4.0000 mg | Freq: Three times a day (TID) | INTRAMUSCULAR | Status: AC | PRN

## 2015-07-24 MED ORDER — SODIUM CHLORIDE 0.9% FLUSH
3.0000 mL | Freq: Two times a day (BID) | INTRAVENOUS | Status: DC
  Administered 2015-07-24 – 2015-07-26 (×4): 3 mL via INTRAVENOUS

## 2015-07-24 MED ORDER — ONDANSETRON HCL 4 MG PO TABS: 4.0000 mg | ORAL_TABLET | Freq: Four times a day (QID) | ORAL | Status: DC | PRN

## 2015-07-24 MED ORDER — MAGNESIUM CITRATE PO SOLN: 1.0000 | Freq: Once | ORAL | Status: DC | PRN

## 2015-07-24 MED ORDER — POTASSIUM CHLORIDE CRYS ER 20 MEQ PO TBCR
20.0000 meq | EXTENDED_RELEASE_TABLET | Freq: Two times a day (BID) | ORAL | Status: DC
  Administered 2015-07-24 – 2015-07-26 (×4): 20 meq via ORAL
  Filled 2015-07-24 (×5): qty 1

## 2015-07-24 MED ORDER — DIGOXIN 125 MCG PO TABS
0.1250 mg | ORAL_TABLET | Freq: Every day | ORAL | Status: DC
  Administered 2015-07-25: 0.125 mg via ORAL
  Filled 2015-07-24: qty 1

## 2015-07-24 MED ORDER — BISACODYL 10 MG RE SUPP: 10.0000 mg | Freq: Every day | RECTAL | Status: DC | PRN

## 2015-07-24 MED ORDER — COLCHICINE 0.6 MG PO TABS: 0.6000 mg | ORAL_TABLET | Freq: Every day | ORAL | Status: DC | PRN

## 2015-07-24 MED ORDER — NITROGLYCERIN 0.4 MG SL SUBL: 0.4000 mg | SUBLINGUAL_TABLET | SUBLINGUAL | Status: DC | PRN

## 2015-07-24 MED ORDER — TRAZODONE HCL 50 MG PO TABS: 25.0000 mg | ORAL_TABLET | Freq: Every evening | ORAL | Status: DC | PRN

## 2015-07-24 MED ORDER — HYDROCODONE-ACETAMINOPHEN 5-325 MG PO TABS: 1.0000 | ORAL_TABLET | ORAL | Status: DC | PRN

## 2015-07-24 MED ORDER — GI COCKTAIL ~~LOC~~
30.0000 mL | Freq: Once | ORAL | Status: AC
  Administered 2015-07-24: 30 mL via ORAL
  Filled 2015-07-24: qty 30

## 2015-07-24 MED ORDER — FUROSEMIDE 40 MG PO TABS
40.0000 mg | ORAL_TABLET | Freq: Every day | ORAL | Status: DC
  Administered 2015-07-25 – 2015-07-26 (×2): 40 mg via ORAL
  Filled 2015-07-24 (×2): qty 1

## 2015-07-24 MED ORDER — METOPROLOL SUCCINATE ER 100 MG PO TB24
100.0000 mg | ORAL_TABLET | Freq: Every day | ORAL | Status: DC
  Administered 2015-07-25: 100 mg via ORAL
  Filled 2015-07-24: qty 1

## 2015-07-24 MED ORDER — INSULIN ASPART 100 UNIT/ML ~~LOC~~ SOLN: 0.0000 [IU] | Freq: Three times a day (TID) | SUBCUTANEOUS | Status: DC

## 2015-07-24 MED ORDER — AMLODIPINE BESYLATE 5 MG PO TABS
5.0000 mg | ORAL_TABLET | Freq: Every day | ORAL | Status: DC
  Administered 2015-07-25 – 2015-07-26 (×2): 5 mg via ORAL
  Filled 2015-07-24 (×2): qty 1

## 2015-07-24 MED ORDER — DABIGATRAN ETEXILATE MESYLATE 150 MG PO CAPS
150.0000 mg | ORAL_CAPSULE | Freq: Two times a day (BID) | ORAL | Status: DC
  Administered 2015-07-24 – 2015-07-25 (×2): 150 mg via ORAL
  Filled 2015-07-24 (×2): qty 1

## 2015-07-24 MED ORDER — DOCUSATE SODIUM 100 MG PO CAPS
100.0000 mg | ORAL_CAPSULE | Freq: Two times a day (BID) | ORAL | Status: DC
  Administered 2015-07-24 – 2015-07-26 (×4): 100 mg via ORAL
  Filled 2015-07-24 (×5): qty 1

## 2015-07-24 NOTE — H&P (Signed)
[facility] History and Physical  Jacob Shannon [id number] DOB: [birth year] DOA: 07/24/2015  Referring physician: Audie Pinto PCP: Fae Pippin   Chief Complaint: "feeling funny"/dizzy/weakness  HPI: Jacob Shannon is a very pleasant 75 y.o. male with a past medical history that includes A. fib, diastolic heart failure, attention, mitral regurg, status post appendectomy 2 months ago since emergency department with the chief complaint "feeling funny". Initial evaluation somewhat concerning for ACS.  Information is obtained from the patient who reports being in his usual state of health until this morning he "felt funny" as he bent over. He initially said he felt pain when pressed to describe location type of pain he denied it was a pain just a "funny" sensation. He states he went to the restroom became diaphoretic lightheaded pale and cold. In addition he felt nauseated and had 2 episodes of bile-like emesis. In addition he reports some increased lower extremity edema and facial edema. He denies orthopnea. He denies visual disturbances numbness or tingling of extremities. He denies difficulty speaking or swallowing. He denies headache fever chills recent sick contacts. He denies dysuria hematuria frequency or urgency. He denies diarrhea constipation melena. He called EMS and reportedly his CBG was 110. Chart review indicates he was in A. fib with PVCs.   In the emergency department he's afebrile hemodynamically stable and not hypoxic.   Review of Systems:  10 point review of systems complete and all systems are negative except as indicated in the history of present illness Past Medical History  Diagnosis Date  . Atrial fibrillation ([place])   . Gout   . Hypertension   . Spinal stenosis   . 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 Vasculitis ([place])   . Hypercholesterolemia    Past Surgical History  Procedure Laterality Date  . Leg skin lesion  biopsy /  excision Right 2010  . Colonoscopy    . Cataract extraction Bilateral    Social History:  reports that he quit smoking about 21 years ago. His smoking use included Cigarettes. He started smoking about 59 years ago. He has a 57 pack-year smoking history. He does not have any smokeless tobacco history on file. He reports that he drinks alcohol. He reports that he does not use illicit drugs. He lives at home with his wife has done so for the last 104 years. He is a retired Engineer, manufacturing systems. He is independent with ADLs no recent falls No Known Allergies  Family History  Problem Relation Age of Onset  . Colon cancer Brother   . Coronary artery disease Brother   . Stomach cancer Brother   . Heart attack Father   . Coronary artery disease Father   . Lung disease Neg Hx   . Rheumatologic disease Neg Hx      Prior to Admission medications   Medication Sig Start Date End Date Taking? Authorizing Provider  amLODipine (NORVASC) 5 MG tablet Take 5 mg by mouth daily.  01/31/15  Yes Historical Provider, MD  colchicine 0.6 MG tablet Take 0.6 mg by mouth daily as needed (gout).   Yes Historical Provider, MD  DIGOX 125 MCG tablet Take 0.125 mg by mouth daily.  02/07/15  Yes Historical Provider, MD  furosemide (LASIX) 40 MG tablet Take 40 mg by mouth daily.  01/10/15  Yes Historical Provider, MD  losartan (COZAAR) 100 MG tablet Take 100 mg by mouth daily.  01/06/15  Yes Historical Provider, MD  metoprolol succinate (TOPROL-XL) 100 MG 24 hr tablet Take 100 mg by mouth daily.  02/07/15  Yes Historical Provider, MD  potassium chloride SA (K-DUR,KLOR-CON) 20 MEQ tablet Take 20 mEq by mouth 2 (two) times daily.  12/28/14  Yes Historical Provider, MD  PRADAXA 150 MG CAPS capsule Take 150 mg by mouth 2 (two) times daily.  01/12/15  Yes Historical Provider, MD   Physical Exam: Filed Vitals:   07/24/15 1130 07/24/15 1230 07/24/15 1300 07/24/15 1330  BP: 156/80 152/83 154/82 169/74  Pulse: 73 77 72 87  Temp:        TempSrc:      Resp: 16 17 17 18   Height:      SpO2: 95% 96% 99% 96%    Wt Readings from Last 3 Encounters:  03/08/15 95.618 kg (210 lb 12.8 oz)  02/08/15 95.165 kg (209 lb 12.8 oz)    General:  Appears calm and comfortable, slightly pale Eyes: PERRL, puffy lids and under eyes, irises & conjunctiva ENT: grossly normal hearing, lips & tongue mucous membranes of his mouth are moist and pink Neck: no LAD, masses or thyromegaly Cardiovascular: Irregularly irregular, no m/r/g. Trace LE edema. Pedal pulses present and palpable  Respiratory:  Normal respiratory effort. Fair air movement.. Fine faint crackles right base no wheeze no rhonchi Abdomen: soft, ntnd positive bowel sounds throughout no guarding Skin: no rash or induration seen on limited exam Musculoskeletal: grossly normal tone BUE/BLE Psychiatric: grossly normal mood and affect, speech fluent and appropriate Neurologic: grossly non-focal. Speech clear facial symmetry moves all extremities oriented 3           Labs on Admission:  Basic Metabolic Panel:  Recent Labs Lab 07/24/15 1120  NA 141  K 3.7  CL 102  CO2 25  GLUCOSE 156*  BUN 20  CREATININE 0.84  CALCIUM 8.9   Liver Function Tests:  Recent Labs Lab 07/24/15 1120  AST 24  ALT 21  ALKPHOS 56  BILITOT 1.0  PROT 6.9  ALBUMIN 3.8    Recent Labs Lab 07/24/15 1120  LIPASE 31   No results for input(s): AMMONIA in the last 168 hours. CBC:  Recent Labs Lab 07/24/15 1120  WBC 10.8*  NEUTROABS 8.6*  HGB 13.5  HCT 41.3  MCV 93.9  PLT 173   Cardiac Enzymes: No results for input(s): CKTOTAL, CKMB, CKMBINDEX, TROPONINI in the last 168 hours.  BNP (last 3 results) No results for input(s): BNP in the last 8760 hours.  ProBNP (last 3 results) No results for input(s): PROBNP in the last 8760 hours.  CBG: No results for input(s): GLUCAP in the last 168 hours.  Radiological Exams on Admission: Dg Abd Acute W/chest  07/24/2015  CLINICAL DATA:   Epigastric discomfort EXAM: DG ABDOMEN ACUTE W/ 1V CHEST COMPARISON:  Chest 05/13/2015 FINDINGS: Cardiac enlargement without heart failure. Lungs are clear without infiltrate or effusion. Normal bowel gas pattern. No bowel obstruction or ileus. No free air. Atherosclerotic calcification in the aorta and iliac arteries. No renal calculi. Lumbar scoliosis and degenerative change. IMPRESSION: No acute abnormality in the chest or abdomen. Electronically Signed   By: Franchot Gallo M.D.   On: 07/24/2015 12:32    EKG: Independently reviewed. Atrial fibrillation Borderline right axis deviation Repol abnrm suggests ischemia, anterolateral Minimal ST elevation, inferior leads No previous tracing  Assessment/Plan Principal Problem:   Chest pain Active Problems:   Pulmonary hypertension (HCC)   Atrial fibrillation (HCC)   Hyperglycemia  #1. Chest pain described  as "feeling funny". Patient with a history of A. fib diastolic dysfunction. Initial troponin 0.03. EKG with A. fib borderline right axis deviation. Heart score 5. Echo done in August 2016 shows EF of 50-55% no wall motion abnormalities -Admit to telemetry -Serial EKGs -Cycle troponins -Repeat echo -Daily weight -Intake and output -Continue Lasix losartan metoprolol -Orthostatic vital signs -Oxygen supplementation as indicated -Nitroglycerin and morphine as indicated -Zofran as needed -Cardiology consult likely need stress test  #2. A. fib. Currently rate controlled. [place] Vas score 4. Home medications include losartan, metoprolol, digoxin Pradaxa -Monitor on telemetry -Continue Pradaxa -Continue her home medications  #3. Hyperglycemia. Mild. No history of diabetes. -Obtain hemoglobin A1c -Sliding scale insulin as indicated  Dr Claiborne Billings with cardiology  Code Status: full DVT Prophylaxis: Family Communication: granddaughter at bedside Disposition Plan: home when ready  Time spent: 52 minutes  [facility]

## 2015-07-24 NOTE — Consult Note (Signed)
Requesting provider: Dr. Marzetta Board, [facility] Primary cardiologist: Dr. Peter Martinique (remotely - saw at [facility]) Consulting cardiologist: Dr. Satira Sark  Reason for consultation: Episode of lightheadedness, shortness of breath  Clinical Summary Jacob Shannon is a 75 y.o.male with past medical history outlined below, currently admitted to the hospital for evaluation of an episode of lightheadedness associated with shortness of breath, abdominal discomfort, and nausea without emesis. He tells me that he felt at baseline this morning, took his medications as usual, was bending over to pick something up off the floor when he suddenly became very lightheaded and felt an intense discomfort in his stomach as if he needed to defecate urgently. He went to the bathroom and did move his bowels, but also felt nauseated, never actually had episode of emesis. At no point did he have any frank discomfort in his chest. He has felt short of breath, but admits that this has been going on for several months. He also states that he has had worsening leg edema recently, and feels like this has been more of a problem since undergoing surgery for a ruptured appendix back in November 2016.  He has a long-standing history of chronic atrial fibrillation, previously saw Dr. Martinique several years ago for evaluation. He recalls undergoing stress testing at that point (results are not available). He has not had regular cardiology follow-up since that time. He did have an echocardiogram done at [facility] in August 2016 reporting LVEF 50-55% with severe left atrial enlargement, moderate mitral regurgitation, mild to moderate tricuspid regurgitation, and RVSP 49 mmHg.  He has also seen Dr. Ashok Cordia for pulmonary evaluation, I reviewed the office notes from August and September 2016. Feeling at that time was that the patient had pulmonary hypertension secondary to diastolic dysfunction and  potentially mitral regurgitation. He was noted have bilateral pleural effusions at one point as well as some degree of mediastinal adenopathy. 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  He denies any regular sense of palpitations with atrial fibrillation. Most of his shortness of breath tends to occur when he bends over or pushes himself in terms of activity. He also describes what sounds like orthopnea at times.   No Known Allergies  Medications Scheduled Medications: . amLODipine  5 mg Oral Daily  . dabigatran  150 mg Oral BID  . digoxin  0.125 mg Oral Daily  . docusate sodium  100 mg Oral BID  . furosemide  40 mg Oral Daily  . insulin aspart  0-15 Units Subcutaneous TID WC  . insulin aspart  0-5 Units Subcutaneous QHS  . [START ON 07/25/2015] losartan  100 mg Oral Daily  . metoprolol succinate  100 mg Oral Daily  . potassium chloride SA  20 mEq Oral BID  . sodium chloride flush  3 mL Intravenous Q12H  . sodium chloride flush  3 mL Intravenous Q12H    PRN Medications: bisacodyl, colchicine, HYDROcodone-acetaminophen, magnesium citrate, morphine injection, nitroGLYCERIN, ondansetron (ZOFRAN) IV, ondansetron **OR** ondansetron (ZOFRAN) IV, traZODone   Past Medical History  Diagnosis Date  . Atrial fibrillation ([place])   . Gout   . Essential hypertension   . Spinal stenosis   . 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 Hypercholesterolemia   . Leukocytoclastic vasculitis ([place])   . Diastolic heart failure ([place])   . Secondary pulmonary hypertension ([place])  RVSP 49 mmHg August 2016  . Mitral regurgitation     Moderate August 2016    Past Surgical History  Procedure Laterality Date  . Leg skin lesion  biopsy / excision Right 2010  . Colonoscopy    . Cataract extraction Bilateral     Family History  Problem Relation Age of Onset  . Colon cancer Brother   .  Coronary artery disease Brother   . Stomach cancer Brother   . Heart attack Father   . Coronary artery disease Father   . Lung disease Neg Hx   . Rheumatologic disease Neg Hx     Social History Jacob Shannon reports that he quit smoking about 21 years ago. His smoking use included Cigarettes. He started smoking about 59 years ago. He has a 57 pack-year smoking history. He does not have any smokeless tobacco history on file. Jacob Shannon reports that he drinks alcohol.  Review of Systems Complete review of systems negative except as otherwise outlined in the clinical summary and also the following. Chronic dyspnea, NYHA class 2-3. No reproducible exertional chest pain. Intermittent orthopnea as well as leg edema. Reported recurring vasculitis manifested as skin changes on his legs. No fevers or chills.  Physical Examination Blood pressure 159/60, pulse 69, temperature 97.7 F (36.5 C), temperature source Oral, resp. rate 20, height 5\' 8"  (1.727 m), weight 181 lb 11.2 oz (82.419 kg), SpO2 96 %.  Intake/Output Summary (Last 24 hours) at 07/24/15 1551 Last data filed at 07/24/15 1403  Gross per 24 hour  Intake      0 ml  Output    425 ml  Net   -425 ml   Telemetry: Atrial fibrillation.  Elderly male, appears comfortable at rest. HEENT: Conjunctiva and lids normal, oropharynx clear. Neck: Supple, elevated JVP, no carotid bruits, no thyromegaly. Lungs: Clear to auscultation, nonlabored breathing at rest. Cardiac: Irregularly irregular, no S3, 3/6 pansystolic murmur heard best at the apex, no pericardial rub. Abdomen: Soft, nontender, bowel sounds present, no guarding or rebound. Extremities: 1-2+ edema with stasis, distal pulses1- 2+. Skin: Warm and dry. Musculoskeletal: No kyphosis. Neuropsychiatric: Alert and oriented x3, affect grossly appropriate.  Lab Results  Basic Metabolic Panel:  Recent Labs Lab 07/24/15 1120  NA 141  K 3.7  CL 102  CO2 25  GLUCOSE 156*  BUN 20    CREATININE 0.84  CALCIUM 8.9   Liver Function Tests:  Recent Labs Lab 07/24/15 1120  AST 24  ALT 21  ALKPHOS 56  BILITOT 1.0  PROT 6.9  ALBUMIN 3.8   CBC:  Recent Labs Lab 07/24/15 1120  WBC 10.8*  NEUTROABS 8.6*  HGB 13.5  HCT 41.3  MCV 93.9  PLT 173   Cardiac Enzymes: POC troponin I 0.03  ECG Rate controlled atrial fibrillation with rightward axis and diffuse nonspecific ST-T changes.  Imaging Acute abdominal series 07/24/2015: FINDINGS: Cardiac enlargement without heart failure. Lungs are clear without infiltrate or effusion.  Normal bowel gas pattern. No bowel obstruction or ileus. No free air. Atherosclerotic calcification in the aorta and iliac arteries. No renal calculi. Lumbar scoliosis and degenerative change.  IMPRESSION: No acute abnormality in the chest or abdomen.  Impression  1. Episode of sudden lightheadedness and abdominal discomfort followed by urgency to move bowels and feeling of nausea, not chest discomfort specifically. This occurred when the patient was bending over to pick something up off the floor. He does not recall having any palpitations at that time and did not have  syncope. At baseline he reports generally worsening shortness of breath over the last year and recurring leg edema. He has no clearly documented history of CAD, but does have long-standing atrial fibrillation and valvular heart disease with diastolic heart failure and pulmonary hypertension. Last echocardiogram was done at [facility] in August 2016 as outlined above.  2. Chronic atrial fibrillation. No sense of palpitations. He is on Lanoxin, Toprol-XL, and Pradaxa as an outpatient.  3. Essential hypertension. Patient reports that his blood pressure has been fairly well controlled until recently. He does not indicate any medication changes recently.  4. History of hyperlipidemia, on statin therapy.  5. History of leukocytoclastic vasculitis, intermittently  treated with steroids.   Recommendations  Discussed with patient and family members present. At this time would recommend cycling cardiac markers, following telemetry, and obtaining a repeat echocardiogram to reevaluate cardiac structure and function as well as valvular status. Need to exclude decrease in LVEF and also worsening of mitral regurgitation and/or pulmonary pressures. Depending on results can decide if further ischemic testing is warranted, and whether this should be a noninvasive or invasive modality. Description of symptoms is atypical for ischemic etiology overall, but this still should be considered.  Satira Sark, M.D., F.A.C.C.

## 2015-07-24 NOTE — Progress Notes (Signed)
Called by telemetry- pt had HR drop to 39 X 2 ocurrences. Ferol Luz PA texted. Pt asymptomatic

## 2015-07-24 NOTE — ED Notes (Addendum)
Per EMS- Pt was doing normal ADLS when he began feeling a "funny feeling" to central chest. Pt became pale, diaphoretic, cold, and had near syncope. Pt also has increased swelling to bilateral legs. Pt takes lasix and metoprolol. 20G PIV to L wrist. CBG 110. Not diabetic. Pt was in afib with PVCs with hx of afib. PT given 4 of zofran in field pta for nausea. Pt had recent surgery to abdomen in November that he has followed up with.

## 2015-07-25 ENCOUNTER — Observation Stay (HOSPITAL_BASED_OUTPATIENT_CLINIC_OR_DEPARTMENT_OTHER): Payer: Medicare Other

## 2015-07-25 DIAGNOSIS — R7989 Other specified abnormal findings of blood chemistry: Secondary | ICD-10-CM | POA: Diagnosis not present

## 2015-07-25 DIAGNOSIS — R0789 Other chest pain: Secondary | ICD-10-CM | POA: Diagnosis not present

## 2015-07-25 DIAGNOSIS — R778 Other specified abnormalities of plasma proteins: Secondary | ICD-10-CM | POA: Insufficient documentation

## 2015-07-25 DIAGNOSIS — R001 Bradycardia, unspecified: Secondary | ICD-10-CM | POA: Diagnosis not present

## 2015-07-25 DIAGNOSIS — R0600 Dyspnea, unspecified: Secondary | ICD-10-CM

## 2015-07-25 DIAGNOSIS — R739 Hyperglycemia, unspecified: Secondary | ICD-10-CM | POA: Diagnosis not present

## 2015-07-25 DIAGNOSIS — R011 Cardiac murmur, unspecified: Secondary | ICD-10-CM

## 2015-07-25 DIAGNOSIS — I272 Other secondary pulmonary hypertension: Secondary | ICD-10-CM | POA: Diagnosis not present

## 2015-07-25 DIAGNOSIS — I482 Chronic atrial fibrillation: Secondary | ICD-10-CM | POA: Diagnosis not present

## 2015-07-25 DIAGNOSIS — R079 Chest pain, unspecified: Secondary | ICD-10-CM | POA: Diagnosis not present

## 2015-07-25 LAB — GLUCOSE, CAPILLARY
GLUCOSE-CAPILLARY: 97 mg/dL (ref 65–99)
Glucose-Capillary: 111 mg/dL — ABNORMAL HIGH (ref 65–99)
Glucose-Capillary: 125 mg/dL — ABNORMAL HIGH (ref 65–99)
Glucose-Capillary: 85 mg/dL (ref 65–99)

## 2015-07-25 LAB — BASIC METABOLIC PANEL
Anion gap: 8 (ref 5–15)
BUN: 19 mg/dL (ref 6–20)
CO2: 29 mmol/L (ref 22–32)
Calcium: 8.5 mg/dL — ABNORMAL LOW (ref 8.9–10.3)
Chloride: 101 mmol/L (ref 101–111)
Creatinine, Ser: 0.78 mg/dL (ref 0.61–1.24)
GFR calc Af Amer: >60 mL/min (ref >60)
GLUCOSE: 103 mg/dL — AB (ref 65–99)
POTASSIUM: 3.8 mmol/L (ref 3.5–5.1)
Sodium: 138 mmol/L (ref 135–145)

## 2015-07-25 LAB — CBC
HEMATOCRIT: 37.3 % — AB (ref 39.0–52.0)
HEMOGLOBIN: 12.3 g/dL — AB (ref 13.0–17.0)
MCH: 31 pg (ref 26.0–34.0)
MCHC: 33 g/dL (ref 30.0–36.0)
MCV: 94 fL (ref 78.0–100.0)
Platelets: 162 10*3/uL (ref 150–400)
RBC: 3.97 MIL/uL — ABNORMAL LOW (ref 4.22–5.81)
RDW: 17.2 % — AB (ref 11.5–15.5)
WBC: 10 10*3/uL (ref 4.0–10.5)

## 2015-07-25 LAB — TROPONIN I: TROPONIN I: 0.05 ng/mL — AB (ref <0.031)

## 2015-07-25 MED ORDER — INSULIN ASPART 100 UNIT/ML ~~LOC~~ SOLN: 0.0000 [IU] | Freq: Three times a day (TID) | SUBCUTANEOUS | Status: DC

## 2015-07-25 MED ORDER — METOPROLOL SUCCINATE ER 50 MG PO TB24
50.0000 mg | ORAL_TABLET | Freq: Every day | ORAL | Status: DC
Start: 2015-07-26 — End: 2015-07-26
  Administered 2015-07-26: 50 mg via ORAL
  Filled 2015-07-25: qty 1

## 2015-07-25 NOTE — Progress Notes (Signed)
  Echocardiogram 2D Echocardiogram has been performed.  Katelynd Blauvelt 07/25/2015, 3:03 PM

## 2015-07-25 NOTE — Progress Notes (Signed)
TELEMETRY: Reviewed telemetry pt in Atrial fibrillation with slow ventricular response. Sometimes rate dropping into 40s.Danley Danker Vitals:   07/25/15 0002 07/25/15 0400 07/25/15 0904 07/25/15 1147  BP: 124/68 157/67 161/76 140/79  Pulse: 68 62 74 58  Temp: 98.2 F (36.8 C) 97.9 F (36.6 C) 97.6 F (36.4 C) 97.5 F (36.4 C)  TempSrc: Oral Oral Oral Oral  Resp: 18 18 18 18   Height:      Weight:  82.146 kg (181 lb 1.6 oz)    SpO2: 96% 96% 96% 98%    Intake/Output Summary (Last 24 hours) at 07/25/15 1212 Last data filed at 07/25/15 1000  Gross per 24 hour  Intake    180 ml  Output   1175 ml  Net   -995 ml   Filed Weights   07/24/15 1417 07/25/15 0400  Weight: 82.419 kg (181 lb 11.2 oz) 82.146 kg (181 lb 1.6 oz)    Subjective Feels better today. Still feels lightheaded. No chest pain or dyspnea. No abdomen.  Marland Kitchen amLODipine  5 mg Oral Daily  . dabigatran  150 mg Oral BID  . digoxin  0.125 mg Oral Daily  . docusate sodium  100 mg Oral BID  . furosemide  40 mg Oral Daily  . insulin aspart  0-15 Units Subcutaneous TID WC  . insulin aspart  0-5 Units Subcutaneous QHS  . losartan  100 mg Oral Daily  . metoprolol succinate  100 mg Oral Daily  . potassium chloride SA  20 mEq Oral BID  . sodium chloride flush  3 mL Intravenous Q12H  . sodium chloride flush  3 mL Intravenous Q12H      LABS: Basic Metabolic Panel:  Recent Labs  07/24/15 1120 07/25/15 0446  NA 141 138  K 3.7 3.8  CL 102 101  CO2 25 29  GLUCOSE 156* 103*  BUN 20 19  CREATININE 0.84 0.78  CALCIUM 8.9 8.5*   Liver Function Tests:  Recent Labs  07/24/15 1120  AST 24  ALT 21  ALKPHOS 56  BILITOT 1.0  PROT 6.9  ALBUMIN 3.8    Recent Labs  07/24/15 1120  LIPASE 31   CBC:  Recent Labs  07/24/15 1120 07/25/15 0446  WBC 10.8* 10.0  NEUTROABS 8.6*  --   HGB 13.5 12.3*  HCT 41.3 37.3*  MCV 93.9 94.0  PLT 173 162   Cardiac Enzymes:  Recent Labs  07/24/15 1429 07/24/15 2139  07/25/15 0446  TROPONINI 0.08* 0.07* 0.05*   BNP: No results for input(s): PROBNP in the last 72 hours. D-Dimer: No results for input(s): DDIMER in the last 72 hours. Hemoglobin A1C: No results for input(s): HGBA1C in the last 72 hours. Fasting Lipid Panel: No results for input(s): CHOL, HDL, LDLCALC, TRIG, CHOLHDL, LDLDIRECT in the last 72 hours. Thyroid Function Tests:  Recent Labs  07/24/15 1429  TSH 1.870     Radiology/Studies:  Dg Abd Acute W/chest  07/24/2015  CLINICAL DATA:  Epigastric discomfort EXAM: DG ABDOMEN ACUTE W/ 1V CHEST COMPARISON:  Chest 05/13/2015 FINDINGS: Cardiac enlargement without heart failure. Lungs are clear without infiltrate or effusion. Normal bowel gas pattern. No bowel obstruction or ileus. No free air. Atherosclerotic calcification in the aorta and iliac arteries. No renal calculi. Lumbar scoliosis and degenerative change. IMPRESSION: No acute abnormality in the chest or abdomen. Electronically Signed   By: Franchot Gallo M.D.   On: 07/24/2015 12:32    PHYSICAL EXAM General: Elderly, well nourished, in no acute  distress. Head: Normal Neck: Negative for carotid bruits. JVD not elevated. No adenopathy Lungs: Clear bilaterally to auscultation without wheezes, rales, or rhonchi. Breathing is unlabored. Heart: IRRR S1 S2 with gr 3/6 holosystolic murmur at the apex. Abdomen: Soft, non-tender, non-distended with normoactive bowel sounds. Small dressing on surgical incision in lower mid abdomen.  Extremities: 1+ edema.  Distal pedal pulses are 2+ and equal bilaterally. Neuro: Alert and oriented X 3. Moves all extremities spontaneously. Psych:  Responds to questions appropriately with a normal affect.  ASSESSMENT AND PLAN: 1. Sudden lightheadedness and abdominal discomfort. Suspect partly related to increased vagal tone. Now with slow ventricular response in Afib. Troponin is mildly elevated.  2. Chronic Afib with slow ventricular response. Will DC  digoxin and reduce metoprolol dose. Monitor on telemetry. [place] vasc score of 3. On Pradaxa. Will hold in am in case he needs cardiac cath later.   3. HTN controlled.  4. Elevated troponin. No chest pain. No Ecg changes. Moderate risk for CAD. Last ischemic evaluation was many years ago. Will arrange for a Lexiscan myoview study tomorrow..  5. Hyperlipidemia not on statin. Will repeat lipid levels.   6. S/p ruptured appendix and colon obstruction. S/p surgery in early December.   Present on Admission:  . Chest pain . Pulmonary hypertension ([place]) . Atrial fibrillation ([place]) . Hyperglycemia  Signed, Alyia Lacerte Martinique, [place] 07/25/2015 12:12 PM

## 2015-07-25 NOTE — Progress Notes (Signed)
Pt has Troponin 0.07, earlier was 0.08, no s/s, MD aware, will continue to monitor, Thanks MIke F RN

## 2015-07-25 NOTE — ED Provider Notes (Signed)
CSN: [id number]     Arrival date & time 07/24/15  1104 History   First MD Initiated Contact with Patient 07/24/15 1105     Chief Complaint  Patient presents with  . Chest Pain      HPI Pt was doing normal ADLS when he began feeling a "funny feeling" to central chest. Pt became pale, diaphoretic, cold, and had near syncope. Pt also has increased swelling to bilateral legs. Pt takes lasix and metoprolol. 20G PIV to L wrist. CBG 110. Not diabetic. Pt was in afib with PVCs with hx of afib. PT given 4 of zofran in field pta for nausea. Pt had recent surgery to abdomen in November that he has followed up with.  Past Medical History  Diagnosis Date  . Chronic atrial fibrillation ([place])   . Gout   . Essential hypertension   . Spinal stenosis   . 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 Hypercholesterolemia   . Leukocytoclastic vasculitis ([place])   . Diastolic heart failure ([place])   . 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 Mitral regurgitation     Moderate August 2016   Past Surgical History  Procedure Laterality Date  . Leg skin lesion  biopsy / excision Right 2010  . Colonoscopy    . Cataract extraction Bilateral    Family History  Problem Relation Age of Onset  . Colon cancer Brother   . Coronary artery disease Brother   . Stomach cancer Brother   . Heart attack Father   . Coronary artery disease Father   . Lung disease Neg Hx   . Rheumatologic disease Neg Hx    Social History  Substance Use Topics  . Smoking status: Former Smoker -- 1.50 packs/day for 38 years    Types: Cigarettes    Start date: 06/18/1956    Quit date: 06/18/1994  . Smokeless tobacco: None  . Alcohol Use: 0.0 oz/week    0 Standard drinks or equivalent per week     Comment: Beer on a weekly basis.    Review of Systems  All other systems reviewed and are negative  Allergies  Review of patient's allergies indicates no known allergies.  Home  Medications   Prior to Admission medications   Medication Sig Start Date End Date Taking? Authorizing Provider  amLODipine (NORVASC) 5 MG tablet Take 5 mg by mouth daily.  01/31/15  Yes Historical Provider, MD  colchicine 0.6 MG tablet Take 0.6 mg by mouth daily as needed (gout).   Yes Historical Provider, MD  DIGOX 125 MCG tablet Take 0.125 mg by mouth daily.  02/07/15  Yes Historical Provider, MD  furosemide (LASIX) 40 MG tablet Take 40 mg by mouth daily.  01/10/15  Yes Historical Provider, MD  losartan (COZAAR) 100 MG tablet Take 100 mg by mouth daily.  01/06/15  Yes Historical Provider, MD  metoprolol succinate (TOPROL-XL) 100 MG 24 hr tablet Take 100 mg by mouth daily.  02/07/15  Yes Historical Provider, MD  potassium chloride SA (K-DUR,KLOR-CON) 20 MEQ tablet Take 20 mEq by mouth 2 (two) times daily.  12/28/14  Yes Historical Provider, MD  PRADAXA 150 MG CAPS capsule Take 150 mg by mouth 2 (two) times daily.  01/12/15  Yes Historical Provider, MD   BP 143/75 mmHg  Pulse 74  Temp(Src) 98.1 F (36.7 C) (Axillary)  Resp 16  Ht 5\' 8"  (1.727 m)  Wt 181 lb 1.6 oz (82.146 kg)  BMI 27.54 kg/m2  SpO2 98% Physical Exam Physical Exam  Nursing note and vitals reviewed. Constitutional: He is oriented to person, place, and time. He appears well-developed and well-nourished. No distress.  HENT:  Head: Normocephalic and atraumatic.  Eyes: Pupils are equal, round, and reactive to light.  Neck: Normal range of motion.  Cardiovascular: Normal rate and intact distal pulses.   Pulmonary/Chest: No respiratory distress.  Abdominal: Normal appearance. He exhibits no distension.  Musculoskeletal: Normal range of motion.  Neurological: He is alert and oriented to person, place, and time. No cranial nerve deficit.  Skin: Skin is warm and dry. No rash noted.  Psychiatric: He has a normal mood and affect. His behavior is normal.   ED Course  Procedures (including critical care time) Labs Review Labs  Reviewed  COMPREHENSIVE METABOLIC PANEL - Abnormal; Notable for the following:    Glucose, Bld 156 (*)    All other components within normal limits  CBC WITH DIFFERENTIAL/PLATELET - Abnormal; Notable for the following:    WBC 10.8 (*)    RDW 17.0 (*)    Neutro Abs 8.6 (*)    All other components within normal limits  URINALYSIS, ROUTINE W REFLEX MICROSCOPIC (NOT AT [facility]) - Abnormal; Notable for the following:    Hgb urine dipstick SMALL (*)    All other components within normal limits  TROPONIN I - Abnormal; Notable for the following:    Troponin I 0.08 (*)    All other components within normal limits  TROPONIN I - Abnormal; Notable for the following:    Troponin I 0.07 (*)    All other components within normal limits  TROPONIN I - Abnormal; Notable for the following:    Troponin I 0.05 (*)    All other components within normal limits  BASIC METABOLIC PANEL - Abnormal; Notable for the following:    Glucose, Bld 103 (*)    Calcium 8.5 (*)    All other components within normal limits  CBC - Abnormal; Notable for the following:    RBC 3.97 (*)    Hemoglobin 12.3 (*)    HCT 37.3 (*)    RDW 17.2 (*)    All other components within normal limits  GLUCOSE, CAPILLARY - Abnormal; Notable for the following:    Glucose-Capillary 104 (*)    All other components within normal limits  GLUCOSE, CAPILLARY - Abnormal; Notable for the following:    Glucose-Capillary 111 (*)    All other components within normal limits  GLUCOSE, CAPILLARY - Abnormal; Notable for the following:    Glucose-Capillary 125 (*)    All other components within normal limits  LIPASE, BLOOD  TSH  URINE MICROSCOPIC-ADD ON  GLUCOSE, CAPILLARY  GLUCOSE, CAPILLARY  GLUCOSE, CAPILLARY  HEMOGLOBIN A1C  CBC  BASIC METABOLIC PANEL  LIPID PANEL  I-STAT TROPOININ, ED    Imaging Review Dg Abd Acute W/chest  07/24/2015  CLINICAL DATA:  Epigastric discomfort EXAM: DG ABDOMEN ACUTE W/ 1V CHEST COMPARISON:  Chest 05/13/2015  FINDINGS: Cardiac enlargement without heart failure. Lungs are clear without infiltrate or effusion. Normal bowel gas pattern. No bowel obstruction or ileus. No free air. Atherosclerotic calcification in the aorta and iliac arteries. No renal calculi. Lumbar scoliosis and degenerative change. IMPRESSION: No acute abnormality in the chest or abdomen. Electronically Signed   By: Franchot Gallo M.D.   On: 07/24/2015 12:32   I have personally reviewed and evaluated these images and lab  results as part of my medical decision-making.   EKG Interpretation   Date/Time:  Sunday July 24 2015 11:11:33 EST Ventricular Rate:  79 PR Interval:    QRS Duration: 99 QT Interval:  416 QTC Calculation: 477 R Axis:   81 Text Interpretation:  Atrial fibrillation Borderline right axis deviation  Repol abnrm suggests ischemia, anterolateral Minimal ST elevation,  inferior leads No previous tracing Confirmed by Afton Lavalle  MD, Armiyah Capron ([id number])  on 07/24/2015 11:16:48 AM      MDM   Final diagnoses:  Chest pain, unspecified chest pain type    \   Leonard Schwartz, MD 07/25/15 2327

## 2015-07-25 NOTE — Progress Notes (Signed)
[facility]                                                                              Patient Demographics  Jacob Shannon, is a 75 y.o. male, DOB - 10-Apr-1941, [id number]  Admit date - 07/24/2015   Admitting Physician Costin Karlyne Greenspan, MD  Outpatient Primary MD for the patient is Fae Pippin  LOS -    Chief Complaint  Patient presents with  . Chest Pain       Brief HPI   Jacob Shannon is a very pleasant 75 y.o. male with a past medical history that includes A. fib, diastolic heart failure, attention, mitral regurg, status post appendectomy 2 months ago since emergency department with the chief complaint "feeling funny". Patient reported being in his usual state of health until this morning he "felt funny" as he bent over. He denied any chest pain or pressure, but was associated with diaphoresis, shortness of breath, nausea and vomiting. He denied any fevers or chills.  In addition he reported some increased lower extremity edema and facial edema. He denied orthopnea. Patient was admitted for further workup. EKG showed atrial fibrillation with possible ST segment depressions in the anterior lead.  Assessment & Plan    Atypical Chest pain, sudden lightheadedness with abdominal discomfort -  Patient with a history of A. fib diastolic dysfunction. Troponins mildly elevated, 2-D echo in August 2016 shows EF of 50-55% no wall motion abnormalities - Cardiology consulted, recommended discontinuing digoxin and reducing metoprolol dose, may need a cardiac cath - Follow 2-D echo, cardiology recommending Lexiscan Myoview  Chronic atrial fibrillation with slow ventricular response  - [place] Vas score 3 - Continue losartan, reduce metoprolol dose, discontinue digoxin by cardiology  - Continue Pradaxa, hold in am for possible cardiac cath   Hyperglycemia. Mild. No history of diabetes. -Obtain hemoglobin A1c -Sliding scale insulin as indicated  Hypertension -  Currently stable, continue losartan, metoprolol  Hyperlipidemia - Not on statin - Follow lipid panel  Code Status: Full CODE STATUS  Family Communication: Discussed in detail with the patient, all imaging results, lab results explained to the patient    Disposition Plan:   Time Spent in minutes 25 minutes  Procedures  None   Consults   cardiology  DVT Prophylaxis pradaxa but on hold for possible cardiac cath  Medications  Scheduled Meds: . amLODipine  5 mg Oral Daily  . docusate sodium  100 mg Oral BID  . furosemide  40 mg Oral Daily  . insulin aspart  0-15 Units Subcutaneous TID WC  . insulin aspart  0-5 Units Subcutaneous QHS  . losartan  100 mg Oral Daily  . [START ON 07/26/2015] metoprolol succinate  50 mg Oral Daily  . potassium chloride SA  20 mEq Oral BID  . sodium chloride flush  3 mL Intravenous Q12H  . sodium chloride flush  3 mL Intravenous Q12H   Continuous Infusions:  PRN Meds:.bisacodyl, colchicine, HYDROcodone-acetaminophen, magnesium citrate, morphine injection, nitroGLYCERIN, ondansetron **OR** ondansetron (ZOFRAN) IV, traZODone   Antibiotics   Anti-infectives    None        Subjective:  Jacob Shannon was seen and examined today. Patient denies dizziness, chest pain, shortness of breath, abdominal pain, N/V/D/C, new weakness, numbess, tingling. Heart rate down in 40s. No acute events overnight.    Objective:   Blood pressure 140/79, pulse 58, temperature 97.5 F (36.4 C), temperature source Oral, resp. rate 18, height 5\' 8"  (1.727 m), weight 82.146 kg (181 lb 1.6 oz), SpO2 98 %.  Wt Readings from Last 3 Encounters:  07/25/15 82.146 kg (181 lb 1.6 oz)  03/08/15 95.618 kg (210 lb 12.8 oz)  02/08/15 95.165 kg (209 lb 12.8 oz)     Intake/Output Summary (Last 24 hours) at 07/25/15 1232 Last data filed at 07/25/15 1000  Gross per 24 hour  Intake    180 ml  Output   1175 ml  Net   -995 ml    Exam  General: Alert and oriented x 3,  NAD  HEENT:  PERRLA, EOMI, Anicteric Sclera, mucous membranes moist.   Neck: Supple, no JVD, no masses  CVS: S1 S2 auscultated, 3/6 holosystolic murmur   Respiratory: Clear to auscultation bilaterally, no wheezing, rales or rhonchi  Abdomen: Soft, nontender, nondistended, + bowel sounds  Ext: no cyanosis clubbing, 1+ edema  Neuro: AAOx3, Cr N's II- XII. Strength 5/5 upper and lower extremities bilaterally  Skin: No rashes  Psych: Normal affect and demeanor, alert and oriented x3    Data Review   Micro Results No results found for this or any previous visit (from the past 240 hour(s)).  Radiology Reports Dg Abd Acute W/chest  07/24/2015  CLINICAL DATA:  Epigastric discomfort EXAM: DG ABDOMEN ACUTE W/ 1V CHEST COMPARISON:  Chest 05/13/2015 FINDINGS: Cardiac enlargement without heart failure. Lungs are clear without infiltrate or effusion. Normal bowel gas pattern. No bowel obstruction or ileus. No free air. Atherosclerotic calcification in the aorta and iliac arteries. No renal calculi. Lumbar scoliosis and degenerative change. IMPRESSION: No acute abnormality in the chest or abdomen. Electronically Signed   By: Franchot Gallo M.D.   On: 07/24/2015 12:32    CBC  Recent Labs Lab 07/24/15 1120 07/25/15 0446  WBC 10.8* 10.0  HGB 13.5 12.3*  HCT 41.3 37.3*  PLT 173 162  MCV 93.9 94.0  MCH 30.7 31.0  MCHC 32.7 33.0  RDW 17.0* 17.2*  LYMPHSABS 1.3  --   MONOABS 0.7  --   EOSABS 0.1  --   BASOSABS 0.0  --     Chemistries   Recent Labs Lab 07/24/15 1120 07/25/15 0446  NA 141 138  K 3.7 3.8  CL 102 101  CO2 25 29  GLUCOSE 156* 103*  BUN 20 19  CREATININE 0.84 0.78  CALCIUM 8.9 8.5*  AST 24  --   ALT 21  --   ALKPHOS 56  --   BILITOT 1.0  --    ------------------------------------------------------------------------------------------------------------------ estimated creatinine clearance is 82.1 mL/min (by C-G formula based on Cr of  0.78). ------------------------------------------------------------------------------------------------------------------ No results for input(s): HGBA1C in the last 72 hours. ------------------------------------------------------------------------------------------------------------------ No results for input(s): CHOL, HDL, LDLCALC, TRIG, CHOLHDL, LDLDIRECT in the last 72 hours. ------------------------------------------------------------------------------------------------------------------  Recent Labs  07/24/15 1429  TSH 1.870   ------------------------------------------------------------------------------------------------------------------ No results for input(s): VITAMINB12, FOLATE, FERRITIN, TIBC, IRON, RETICCTPCT in the last 72 hours.  Coagulation profile No results for input(s): INR, PROTIME in the last 168 hours.  No results for input(s): DDIMER in the last 72 hours.  Cardiac Enzymes  Recent Labs Lab 07/24/15 1429 07/24/15 2139 07/25/15 0446  TROPONINI 0.08* 0.07*  0.05*   ------------------------------------------------------------------------------------------------------------------ Invalid input(s): POCBNP   Recent Labs  07/24/15 1624 07/24/15 2103 07/25/15 0636  GLUCAP 89 104* 111*     Jacob Shannon M.D. [facility] 07/25/2015, 12:32 PM  Pager: [phone] Between 7am to 7pm - call Pager - [phone]  After 7pm go to [email or web address] - password [email or web address]  Call night coverage person covering after 7pm

## 2015-07-26 ENCOUNTER — Observation Stay (HOSPITAL_COMMUNITY): Payer: Medicare Other

## 2015-07-26 DIAGNOSIS — R7989 Other specified abnormal findings of blood chemistry: Secondary | ICD-10-CM | POA: Diagnosis not present

## 2015-07-26 DIAGNOSIS — I340 Nonrheumatic mitral (valve) insufficiency: Secondary | ICD-10-CM | POA: Diagnosis present

## 2015-07-26 DIAGNOSIS — R739 Hyperglycemia, unspecified: Secondary | ICD-10-CM | POA: Diagnosis not present

## 2015-07-26 DIAGNOSIS — I341 Nonrheumatic mitral (valve) prolapse: Secondary | ICD-10-CM | POA: Diagnosis present

## 2015-07-26 DIAGNOSIS — R079 Chest pain, unspecified: Secondary | ICD-10-CM

## 2015-07-26 DIAGNOSIS — R0789 Other chest pain: Secondary | ICD-10-CM | POA: Diagnosis not present

## 2015-07-26 DIAGNOSIS — I272 Other secondary pulmonary hypertension: Secondary | ICD-10-CM | POA: Diagnosis not present

## 2015-07-26 DIAGNOSIS — I482 Chronic atrial fibrillation: Secondary | ICD-10-CM | POA: Diagnosis not present

## 2015-07-26 LAB — NM MYOCAR MULTI W/SPECT W/WALL MOTION / EF
CHL CUP RESTING HR STRESS: 83 {beats}/min
Peak HR: 108 {beats}/min

## 2015-07-26 LAB — CBC
HEMATOCRIT: 40.1 % (ref 39.0–52.0)
HEMOGLOBIN: 12.6 g/dL — AB (ref 13.0–17.0)
MCH: 29.6 pg (ref 26.0–34.0)
MCHC: 31.4 g/dL (ref 30.0–36.0)
MCV: 94.1 fL (ref 78.0–100.0)
Platelets: 164 10*3/uL (ref 150–400)
RBC: 4.26 MIL/uL (ref 4.22–5.81)
RDW: 17.1 % — AB (ref 11.5–15.5)
WBC: 9 10*3/uL (ref 4.0–10.5)

## 2015-07-26 LAB — BASIC METABOLIC PANEL
ANION GAP: 10 (ref 5–15)
BUN: 19 mg/dL (ref 6–20)
CHLORIDE: 101 mmol/L (ref 101–111)
CO2: 26 mmol/L (ref 22–32)
CREATININE: 0.79 mg/dL (ref 0.61–1.24)
Calcium: 8.7 mg/dL — ABNORMAL LOW (ref 8.9–10.3)
GFR calc non Af Amer: >60 mL/min (ref >60)
GLUCOSE: 94 mg/dL (ref 65–99)
Potassium: 4.2 mmol/L (ref 3.5–5.1)
Sodium: 137 mmol/L (ref 135–145)

## 2015-07-26 LAB — HEMOGLOBIN A1C
Hgb A1c MFr Bld: 5.5 % (ref 4.8–5.6)
MEAN PLASMA GLUCOSE: 111 mg/dL

## 2015-07-26 LAB — GLUCOSE, CAPILLARY
GLUCOSE-CAPILLARY: 87 mg/dL (ref 65–99)
GLUCOSE-CAPILLARY: 159 mg/dL — AB (ref 65–99)

## 2015-07-26 LAB — LIPID PANEL
CHOL/HDL RATIO: 3.8 ratio
CHOLESTEROL: 130 mg/dL (ref 0–200)
HDL: 34 mg/dL — AB (ref >40)
LDL Cholesterol: 79 mg/dL (ref 0–99)
TRIGLYCERIDES: 87 mg/dL (ref <150)
VLDL: 17 mg/dL (ref 0–40)

## 2015-07-26 MED ORDER — REGADENOSON 0.4 MG/5ML IV SOLN
INTRAVENOUS | Status: AC
  Filled 2015-07-26: qty 5

## 2015-07-26 MED ORDER — TECHNETIUM TC 99M SESTAMIBI GENERIC - CARDIOLITE
10.0000 | Freq: Once | INTRAVENOUS | Status: AC | PRN
  Administered 2015-07-26: 10 via INTRAVENOUS

## 2015-07-26 MED ORDER — REGADENOSON 0.4 MG/5ML IV SOLN
0.4000 mg | Freq: Once | INTRAVENOUS | Status: AC
  Administered 2015-07-26: 0.4 mg via INTRAVENOUS
  Filled 2015-07-26: qty 5

## 2015-07-26 MED ORDER — METOPROLOL SUCCINATE ER 50 MG PO TB24: 50.0000 mg | ORAL_TABLET | Freq: Every day | ORAL | Status: DC

## 2015-07-26 MED ORDER — DABIGATRAN ETEXILATE MESYLATE 150 MG PO CAPS: 150.0000 mg | ORAL_CAPSULE | Freq: Two times a day (BID) | ORAL | Status: DC

## 2015-07-26 MED ORDER — TECHNETIUM TC 99M SESTAMIBI GENERIC - CARDIOLITE
30.0000 | Freq: Once | INTRAVENOUS | Status: AC | PRN
  Administered 2015-07-26: 30 via INTRAVENOUS

## 2015-07-26 NOTE — Discharge Summary (Signed)
Pt got discharged to home, discharge instructions provided and patient showed understanding to it, IV taken out,Telemonitor DC,pt left unit in wheelchair with all of the belongings accompanied with a family member (wife and Son)

## 2015-07-26 NOTE — Progress Notes (Signed)
[facility]                                                                              Patient Demographics  Jacob Shannon, is a 75 y.o. male, DOB - 04-Apr-1941, [id number]  Admit date - 07/24/2015   Admitting Physician Costin Karlyne Greenspan, MD  Outpatient Primary MD for the patient is Fae Pippin  LOS -    Chief Complaint  Patient presents with  . Chest Pain       Brief HPI   Jacob Shannon is a very pleasant 75 y.o. male with a past medical history that includes A. fib, diastolic heart failure, attention, mitral regurg, status post appendectomy 2 months ago since emergency department with the chief complaint "feeling funny". Patient reported being in his usual state of health until this morning he "felt funny" as he bent over. He denied any chest pain or pressure, but was associated with diaphoresis, shortness of breath, nausea and vomiting. He denied any fevers or chills.  In addition he reported some increased lower extremity edema and facial edema. He denied orthopnea. Patient was admitted for further workup. EKG showed atrial fibrillation with possible ST segment depressions in the anterior lead.  Assessment & Plan    Atypical Chest pain, sudden lightheadedness with abdominal discomfort- resolved  -  Patient with a history of A. fib diastolic dysfunction. Troponins mildly elevated, 2-D echo in August 2016 shows EF of 50-55% no wall motion abnormalities - Cardiology consulted, recommended discontinuing digoxin and reducing metoprolol dose, may need a cardiac cath - 2-D echo showed EF of 55-60%, no regional wall motion abnormalities, moderate to severe mitral regurgitation  - Stress test pending today   Chronic atrial fibrillation with slow ventricular response  - [place] Vas score 3 - Continue losartan, reduce metoprolol dose, discontinued digoxin by cardiology  - Continue Pradaxa, holding for possible cath if needed   Hyperglycemia. Mild. No history of  diabetes. - hemoglobin A1c 5.5 -Sliding scale insulin as indicated  Hypertension - Currently stable, continue losartan, metoprolol  Hyperlipidemia - Not on statin - LDL 79  Code Status: Full CODE STATUS  Family Communication: Discussed in detail with the patient, all imaging results, lab results explained to the patient , son and wife at the bedside   Disposition Plan: Possible DC today, pending stress test results  Time Spent in minutes 25 minutes  Procedures  None   Consults   cardiology  DVT Prophylaxis pradaxa but on hold for possible cardiac cath  Medications  Scheduled Meds: . amLODipine  5 mg Oral Daily  . docusate sodium  100 mg Oral BID  . furosemide  40 mg Oral Daily  . insulin aspart  0-5 Units Subcutaneous QHS  . insulin aspart  0-9 Units Subcutaneous TID WC  . losartan  100 mg Oral Daily  . metoprolol succinate  50 mg Oral Daily  . potassium chloride SA  20 mEq Oral BID  . sodium chloride flush  3 mL Intravenous Q12H  . sodium chloride flush  3 mL Intravenous Q12H   Continuous Infusions:  PRN Meds:.bisacodyl, colchicine, HYDROcodone-acetaminophen, magnesium citrate, morphine injection, nitroGLYCERIN,  ondansetron **OR** ondansetron (ZOFRAN) IV, traZODone   Antibiotics   Anti-infectives    None        Subjective:   Jacob Shannon was seen and examined today. Patient denies dizziness, chest pain, shortness of breath, abdominal pain, N/V/D/C, new weakness, numbess, tingling. Heart rate better today No acute events overnight.    Objective:   Blood pressure 109/54, pulse 71, temperature 98.1 F (36.7 C), temperature source Axillary, resp. rate 16, height 5\' 8"  (1.727 m), weight 81.92 kg (180 lb 9.6 oz), SpO2 97 %.  Wt Readings from Last 3 Encounters:  07/26/15 81.92 kg (180 lb 9.6 oz)  03/08/15 95.618 kg (210 lb 12.8 oz)  02/08/15 95.165 kg (209 lb 12.8 oz)     Intake/Output Summary (Last 24 hours) at 07/26/15 1347 Last data filed at  07/26/15 1055  Gross per 24 hour  Intake    700 ml  Output    762 ml  Net    -62 ml    Exam  General: Alert and oriented x 3, NAD  HEENT:  PERRLA, EOMI, Anicteric Sclera, mucous membranes moist.   Neck: Supple, no JVD, no masses  CVS: S1 S2 auscultated, 3/6 holosystolic murmur   Respiratory: CTAB  Abdomen: Soft, nontender, nondistended, + bowel sounds  Ext: no cyanosis clubbing, 1+ edema  Neuro: no new deficits  Skin: No rashes  Psych: Normal affect and demeanor, alert and oriented x3    Data Review   Micro Results No results found for this or any previous visit (from the past 240 hour(s)).  Radiology Reports Dg Abd Acute W/chest  07/24/2015  CLINICAL DATA:  Epigastric discomfort EXAM: DG ABDOMEN ACUTE W/ 1V CHEST COMPARISON:  Chest 05/13/2015 FINDINGS: Cardiac enlargement without heart failure. Lungs are clear without infiltrate or effusion. Normal bowel gas pattern. No bowel obstruction or ileus. No free air. Atherosclerotic calcification in the aorta and iliac arteries. No renal calculi. Lumbar scoliosis and degenerative change. IMPRESSION: No acute abnormality in the chest or abdomen. Electronically Signed   By: Franchot Gallo M.D.   On: 07/24/2015 12:32    CBC  Recent Labs Lab 07/24/15 1120 07/25/15 0446 07/26/15 0423  WBC 10.8* 10.0 9.0  HGB 13.5 12.3* 12.6*  HCT 41.3 37.3* 40.1  PLT 173 162 164  MCV 93.9 94.0 94.1  MCH 30.7 31.0 29.6  MCHC 32.7 33.0 31.4  RDW 17.0* 17.2* 17.1*  LYMPHSABS 1.3  --   --   MONOABS 0.7  --   --   EOSABS 0.1  --   --   BASOSABS 0.0  --   --     Chemistries   Recent Labs Lab 07/24/15 1120 07/25/15 0446 07/26/15 0423  NA 141 138 137  K 3.7 3.8 4.2  CL 102 101 101  CO2 25 29 26   GLUCOSE 156* 103* 94  BUN 20 19 19   CREATININE 0.84 0.78 0.79  CALCIUM 8.9 8.5* 8.7*  AST 24  --   --   ALT 21  --   --   ALKPHOS 56  --   --   BILITOT 1.0  --   --     ------------------------------------------------------------------------------------------------------------------ estimated creatinine clearance is 76 mL/min (by C-G formula based on Cr of 0.79). ------------------------------------------------------------------------------------------------------------------  Recent Labs  07/25/15 1409  HGBA1C 5.5   ------------------------------------------------------------------------------------------------------------------  Recent Labs  07/26/15 0423  CHOL 130  HDL 34*  LDLCALC 79  TRIG 87  CHOLHDL 3.8   ------------------------------------------------------------------------------------------------------------------  Recent Labs  07/24/15 1429  TSH 1.870   ------------------------------------------------------------------------------------------------------------------ No results for input(s): VITAMINB12, FOLATE, FERRITIN, TIBC, IRON, RETICCTPCT in the last 72 hours.  Coagulation profile No results for input(s): INR, PROTIME in the last 168 hours.  No results for input(s): DDIMER in the last 72 hours.  Cardiac Enzymes  Recent Labs Lab 07/24/15 1429 07/24/15 2139 07/25/15 0446  TROPONINI 0.08* 0.07* 0.05*   ------------------------------------------------------------------------------------------------------------------ Invalid input(s): POCBNP   Recent Labs  07/25/15 0636 07/25/15 1142 07/25/15 1651 07/25/15 2140 07/26/15 0603 07/26/15 1147  GLUCAP 111* 125* 97 38 87 159*     RAI,RIPUDEEP M.D. [facility] 07/26/2015, 1:47 PM  Pager: [phone] Between 7am to 7pm - call Pager - [phone]  After 7pm go to [email or web address] - password [email or web address]  Call night coverage person covering after 7pm

## 2015-07-26 NOTE — Progress Notes (Signed)
Subjective: No CP or SOB  Objective: Vital signs in last 24 hours: Temp:  [97.5 F (36.4 C)-98.1 F (36.7 C)] 98.1 F (36.7 C) (02/06 2313) Pulse Rate:  [58-78] 78 (02/07 0907) Resp:  [16-18] 16 (02/06 2313) BP: (140-186)/(75-99) 186/99 mmHg (02/07 0907) SpO2:  [97 %-98 %] 98 % (02/06 2313) Weight:  [180 lb 9.6 oz (81.92 kg)] 180 lb 9.6 oz (81.92 kg) (02/07 [id number]) Last BM Date: 07/24/15  Intake/Output from previous day: 02/06 0701 - 02/07 0700 In: 700 [P.O.:700] Out: 960 [Urine:960] Intake/Output this shift:    Medications Scheduled Meds: . amLODipine  5 mg Oral Daily  . docusate sodium  100 mg Oral BID  . furosemide  40 mg Oral Daily  . insulin aspart  0-5 Units Subcutaneous QHS  . insulin aspart  0-9 Units Subcutaneous TID WC  . losartan  100 mg Oral Daily  . metoprolol succinate  50 mg Oral Daily  . potassium chloride SA  20 mEq Oral BID  . sodium chloride flush  3 mL Intravenous Q12H  . sodium chloride flush  3 mL Intravenous Q12H   Continuous Infusions:  PRN Meds:.bisacodyl, colchicine, HYDROcodone-acetaminophen, magnesium citrate, morphine injection, nitroGLYCERIN, ondansetron **OR** ondansetron (ZOFRAN) IV, traZODone  PE: General appearance: alert, cooperative and no distress Lungs: clear to auscultation bilaterally Heart: irregularly irregular rhythm and 3/6 sys MM loudest at apex Extremities: Trace LEE Pulses: 2+ and symmetric Skin: Warm and dry Neurologic: Grossly normal  Lab Results:   Recent Labs  07/24/15 1120 07/25/15 0446 07/26/15 0423  WBC 10.8* 10.0 9.0  HGB 13.5 12.3* 12.6*  HCT 41.3 37.3* 40.1  PLT 173 162 164   BMET  Recent Labs  07/24/15 1120 07/25/15 0446 07/26/15 0423  NA 141 138 137  K 3.7 3.8 4.2  CL 102 101 101  CO2 25 29 26   GLUCOSE 156* 103* 94  BUN 20 19 19   CREATININE 0.84 0.78 0.79  CALCIUM 8.9 8.5* 8.7*   PT/INR No results for input(s): LABPROT, INR in the last 72 hours. Cholesterol  Recent Labs  07/26/15 0423  CHOL 130   Lipid Panel     Component Value Date/Time   CHOL 130 07/26/2015 0423   TRIG 87 07/26/2015 0423   HDL 34* 07/26/2015 0423   CHOLHDL 3.8 07/26/2015 0423   VLDL 17 07/26/2015 0423   LDLCALC 79 07/26/2015 0423   Assessment/Plan   Principal Problem:   Chest pain Active Problems:   Pulmonary hypertension (HCC)   Atrial fibrillation (HCC)   Hyperglycemia   Pain in the chest   Elevated troponin  1. Sudden lightheadedness and abdominal discomfort. Suspected to be related to increased vagal tone. Now with slow ventricular response in Afib. Troponin is mildly elevated 0.08  2. Chronic Afib with slow ventricular response.  Rate improved.  83BPM.  Metoprolol decreased yesterday and digoxin stopped.  Monitor on telemetry.  Mali vasc score of 3. On Pradaxa. Will hold in am in case he needs cardiac cath later.   3. HTN controlled.  4. Elevated troponin. No chest pain. No Ecg changes. Moderate risk for CAD. Last ischemic evaluation was many years ago. Lexiscan myoview completed.  5. Hyperlipidemia not on statin. LDL 79  6. S/p ruptured appendix and colon obstruction. S/p surgery in early December.   7. 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 Wall thickness was increased  in a pattern of moderate LVH. Systolic function was normal. The estimated ejection fraction was in the range of 55% to 60%. Wall motion was normal; there were no regional wall motion abnormalities. The study is not technically sufficient to allow evaluation of LV diastolic function. - Aortic valve: Mildly calcified leaflets. There was no stenosis. There was mild regurgitation. - Mitral valve: Thickening and late systolic prolapase of the anterior mitral leaflet. There is moderate to severe, posteriorly directed mitral regurgitation. - Left atrium: Massively  dilated at 64 ml/m2. - Right atrium: Massively dilated at 50 cm2. - Tricuspid valve: There was moderate regurgitation. - Pulmonary arteries: PA peak pressure: 56 mm Hg (S). - Inferior vena cava: The vessel was dilated. The respirophasic diameter changes were blunted (< 50%), consistent with elevated central venous pressure.  Impressions:  - LVEF 55-60%, moderate LVH, normal wall motion, mild AI, thickening and prolapse of the AMVL with moderate to severe, posteriorly directed regurgitation at the RA free wall, Massive biatrial enlargement, moderate TR with RVSP of 56 mmHg (moderate pulmonary hypertension), dilated IVC.  Patient seen and examined and history reviewed. Agree with above findings and plan. Afib rate is improved today with reduction of medication. Myoview study reviewed. No ischemia and normal EF. Echo also reviewed. This shows normal LV size and function. He has prolapse of the anterior MV leaflet with moderate to severe MR. He is asymptomatic. He already has Afib. No overt CHF and normal LV. This would argue for continued medical therapy. I do not see an indication for MV repair at this time. He is stable to be DC today. Will arrange cardiology follow up in our office.   Shantrell Placzek [place], [place] 07/26/2015 4:01 PM

## 2015-07-26 NOTE — Discharge Summary (Signed)
Physician Discharge Summary   Patient ID: RAUDEL CUNIGAN MRN: [id number] DOB/AGE: [birth year] 75 y.o.  Admit date: 07/24/2015 Discharge date: 07/26/2015  Primary Care Physician:  Fae Pippin  Discharge Diagnoses:    . Chest pain . Pulmonary hypertension ([place]) . Atrial fibrillation ([place]) . Hyperglycemia  Consults: Cardiology  Recommendations for Outpatient Follow-up:  1. Please note digoxin discontinued due to bradycardia Toprol-XL decreased to 50 mg daily 2. Please repeat CBC/BMET at next visit   DIET: Heart healthy diet  Allergies:  No Known Allergies   DISCHARGE MEDICATIONS: Discharge Medication List as of 07/26/2015  4:16 PM    CONTINUE these medications which have CHANGED   Details  metoprolol succinate (TOPROL-XL) 50 MG 24 hr tablet Take 1 tablet (50 mg total) by mouth daily. Take with or immediately following a meal., Starting 07/26/2015, Until Discontinued, Print      CONTINUE these medications which have NOT CHANGED   Details  amLODipine (NORVASC) 5 MG tablet Take 5 mg by mouth daily. , Starting 01/31/2015, Until Discontinued, Historical Med    colchicine 0.6 MG tablet Take 0.6 mg by mouth daily as needed (gout)., Until Discontinued, Historical Med    furosemide (LASIX) 40 MG tablet Take 40 mg by mouth daily. , Starting 01/10/2015, Until Discontinued, Historical Med    losartan (COZAAR) 100 MG tablet Take 100 mg by mouth daily. , Starting 01/06/2015, Until Discontinued, Historical Med    potassium chloride SA (K-DUR,KLOR-CON) 20 MEQ tablet Take 20 mEq by mouth 2 (two) times daily. , Starting 12/28/2014, Until Discontinued, Historical Med    PRADAXA 150 MG CAPS capsule Take 150 mg by mouth 2 (two) times daily. , Starting 01/12/2015, Until Discontinued, Historical Med      STOP taking these medications     DIGOX 125 MCG tablet          Brief H and P: For complete details please refer to admission H and P, but in briefSwanson L Shannon is a very pleasant 75  y.o. male with a past medical history that includes A. fib, diastolic heart failure, attention, mitral regurg, status post appendectomy 2 months ago since emergency department with the chief complaint "feeling funny". Patient reported being in his usual state of health until this morning he "felt funny" as he bent over. He denied any chest pain or pressure, but was associated with diaphoresis, shortness of breath, nausea and vomiting. He denied any fevers or chills. In addition he reported some increased lower extremity edema and facial edema. He denied orthopnea. Patient was admitted for further workup. EKG showed atrial fibrillation with possible ST segment depressions in the anterior lead.  Hospital Course:   Atypical Chest pain, sudden lightheadedness with abdominal discomfort- resolved  - Patient with a history of A. fib diastolic dysfunction. Troponins mildly elevated, 2-D echo in August 2016 shows EF of 50-55% no wall motion abnormalities - Cardiology consulted, recommended discontinuing digoxin and reducing metoprolol dose, may need a cardiac cath - 2-D echo showed EF of 55-60%, no regional wall motion abnormalities, prolapse of the anterior MV leaflet with moderate to severe mitral regurgitation. No indication for MV repair at this time.  - Stress test showed no ischemia, normal EF. - Patient was cleared to be discharged home by cardiology  Chronic atrial fibrillation with slow ventricular response  - Mali Vas score 3 - Continue losartan, reduce metoprolol dose, discontinued digoxin by cardiology  - Continue Pradaxa, holding for possible cath if needed   Hyperglycemia. Mild. No history of  diabetes. - hemoglobin A1c 5.5 -Sliding scale insulin as indicated  Hypertension - Currently stable, continue losartan, metoprolol  Hyperlipidemia - Not on statin - LDL 79   Day of Discharge BP 109/54 mmHg  Pulse 71  Temp(Src) 98.1 F (36.7 C) (Axillary)  Resp 16  Ht 5\' 8"  (1.727 m)   Wt 81.92 kg (180 lb 9.6 oz)  BMI 27.47 kg/m2  SpO2 97%  Physical Exam: General: Alert and awake oriented x3 not in any acute distress. HEENT: anicteric sclera, pupils reactive to light and accommodation CVS: S1-S2 clear no murmur rubs or gallops Chest: clear to auscultation bilaterally, no wheezing rales or rhonchi Abdomen: soft nontender, nondistended, normal bowel sounds Extremities: no cyanosis, clubbing or edema noted bilaterally Neuro: Cranial nerves II-XII intact, no focal neurological deficits   The results of significant diagnostics from this hospitalization (including imaging, microbiology, ancillary and laboratory) are listed below for reference.    LAB RESULTS: Basic Metabolic Panel:  Recent Labs Lab 07/25/15 0446 07/26/15 0423  NA 138 137  K 3.8 4.2  CL 101 101  CO2 29 26  GLUCOSE 103* 94  BUN 19 19  CREATININE 0.78 0.79  CALCIUM 8.5* 8.7*   Liver Function Tests:  Recent Labs Lab 07/24/15 1120  AST 24  ALT 21  ALKPHOS 56  BILITOT 1.0  PROT 6.9  ALBUMIN 3.8    Recent Labs Lab 07/24/15 1120  LIPASE 31   No results for input(s): AMMONIA in the last 168 hours. CBC:  Recent Labs Lab 07/24/15 1120 07/25/15 0446 07/26/15 0423  WBC 10.8* 10.0 9.0  NEUTROABS 8.6*  --   --   HGB 13.5 12.3* 12.6*  HCT 41.3 37.3* 40.1  MCV 93.9 94.0 94.1  PLT 173 162 164   Cardiac Enzymes:  Recent Labs Lab 07/24/15 2139 07/25/15 0446  TROPONINI 0.07* 0.05*   BNP: Invalid input(s): POCBNP CBG:  Recent Labs Lab 07/26/15 0603 07/26/15 1147  GLUCAP 87 159*    Significant Diagnostic Studies:  Dg Abd Acute W/chest  07/24/2015  CLINICAL DATA:  Epigastric discomfort EXAM: DG ABDOMEN ACUTE W/ 1V CHEST COMPARISON:  Chest 05/13/2015 FINDINGS: Cardiac enlargement without heart failure. Lungs are clear without infiltrate or effusion. Normal bowel gas pattern. No bowel obstruction or ileus. No free air. Atherosclerotic calcification in the aorta and iliac  arteries. No renal calculi. Lumbar scoliosis and degenerative change. IMPRESSION: No acute abnormality in the chest or abdomen. Electronically Signed   By: Franchot Gallo M.D.   On: 07/24/2015 12:32    2D ECHO:   Disposition and Follow-up: Discharge Instructions    Diet - low sodium heart healthy    Complete by:  As directed      Increase activity slowly    Complete by:  As directed             DISPOSITION: Home  DISCHARGE FOLLOW-UP Follow-up Information    Follow up with Cyndi Bender, PA-C On 08/09/2015.   Specialty:  Physician Assistant   Why:  @ 11 am for hospital follow-up. Confirmed appointment with Hope.    Contact information:   [address] [place] [address] [phone]       Follow up with Isaiah Serge, NP On 08/09/2015.   Specialties:  Cardiology, Radiology   Why:  Your first cardiology follow up appt is with Cecilie Kicks, NP at our [facility] office at  8:00 AM   Contact information:   [place]  [place] [phone]        Time spent on Discharge: 88mins   Signed:   Alfredo Collymore M.D. [facility] 07/26/2015, 6:23 PM Pager: [phone]

## 2015-08-02 ENCOUNTER — Encounter: Payer: Self-pay | Admitting: Cardiology

## 2015-08-02 DIAGNOSIS — I340 Nonrheumatic mitral (valve) insufficiency: Secondary | ICD-10-CM | POA: Diagnosis not present

## 2015-08-02 DIAGNOSIS — R0981 Nasal congestion: Secondary | ICD-10-CM | POA: Diagnosis not present

## 2015-08-02 DIAGNOSIS — R001 Bradycardia, unspecified: Secondary | ICD-10-CM | POA: Diagnosis not present

## 2015-08-02 DIAGNOSIS — R531 Weakness: Secondary | ICD-10-CM | POA: Diagnosis not present

## 2015-08-02 DIAGNOSIS — I4891 Unspecified atrial fibrillation: Secondary | ICD-10-CM | POA: Diagnosis not present

## 2015-08-02 DIAGNOSIS — Z1389 Encounter for screening for other disorder: Secondary | ICD-10-CM | POA: Diagnosis not present

## 2015-08-04 DIAGNOSIS — R0602 Shortness of breath: Secondary | ICD-10-CM | POA: Diagnosis not present

## 2015-08-04 DIAGNOSIS — Z6831 Body mass index (BMI) 31.0-31.9, adult: Secondary | ICD-10-CM | POA: Diagnosis not present

## 2015-08-04 NOTE — Progress Notes (Signed)
Cardiology Office Note:    Date:  08/05/2015   ID:  Jacob Shannon, DOB [birth year], MRN [id number]  PCP:  Jacob Shannon  Cardiologist:  Dr. Peter Shannon   Electrophysiologist:  n/a  Chief Complaint  Patient presents with  . Hospitalization Follow-up    atrial fibrillation  . Shortness of Breath    History of Present Illness:     Jacob Shannon is a 75 y.o. male with a hx of chronic atrial fibrillation, HTN, HL. Previously followed by Dr. Martinique years ago. Echocardiogram done in 8/16 at [facility] with normal EF, severe left atrial enlargement, moderate MR and mild to moderate TR with RVSP 49 mmHg. He has been evaluated by pulmonology in the recent past. Records indicated that pulmonary hypertension is felt to be secondary to diastolic dysfunction and possibly mitral regurgitation. He has been noted to have bilateral pleural effusions in the past as well as some degree of mediastinal adenopathy. 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 Systemic or pulmonary process was not felt to be active.  Admitted 2/5-2/7 with lightheadedness associated with dyspnea, abdominal discomfort and nausea without emesis.  He was evaluated by cardiology. Cardiac enzymes were minimally elevated without clear trend.  Rate controlling medications were adjusted secondary to slow ventricular response. Myoview was obtained as an inpatient. This demonstrated no ischemia and normal EF. Echocardiogram did demonstrate normal LV function with prolapse of anterior mitral valve leaflet and moderate to severe mitral regurgitation as well as massive biatrial enlargement and moderate pulmonary hypertension. This was reviewed by Dr. Martinique who felt that there was no indication for mitral valve repair at this time.  He returned today for FU. He notes a 7 lb weight gain since DC.  Over the past 2 nights, he has had difficulty laying flat.  He slept sitting up one night.  He denies PND.  LE edema is worse.   R leg is always > L leg.  He denies chest pain, syncope, dizziness.  Denies any bleeding issues.     Past Medical History  Diagnosis Date  . Chronic atrial fibrillation ([place])   . Gout   . Essential hypertension   . Spinal stenosis   . 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 Hypercholesterolemia   . Leukocytoclastic vasculitis ([place])   . Diastolic heart failure ([place])   . 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 Mitral regurgitation     Moderate August 2016    Past Surgical History  Procedure Laterality Date  . Leg skin lesion  biopsy / excision Right 2010  . Colonoscopy    . Cataract extraction Bilateral     Current Medications: Outpatient Prescriptions Prior to Visit  Medication Sig Dispense Refill  . amLODipine (NORVASC) 5 MG tablet Take 5 mg by mouth daily.     . colchicine 0.6 MG tablet Take 0.6 mg by mouth daily as needed (gout).    Marland Kitchen losartan (COZAAR) 100 MG tablet Take 100 mg by mouth daily.     Marland Kitchen PRADAXA 150 MG CAPS capsule Take 150 mg by mouth 2 (two) times daily.     . furosemide (LASIX) 40 MG tablet Take 40 mg by mouth 2 (two) times daily.    . metoprolol succinate (TOPROL-XL) 50 MG 24 hr tablet Take 1 tablet (50 mg total) by mouth daily. Take with or immediately following a meal. 30 tablet 3  .  potassium chloride SA (K-DUR,KLOR-CON) 20 MEQ tablet Take 20 mEq by mouth 2 (two) times daily.      No facility-administered medications prior to visit.     Allergies:   Review of patient's allergies indicates no known allergies.   Social History   Social History  . Marital Status: Married    Spouse Name: N/A  . Number of Children: 2  . Years of Education: N/A   Occupational History  . self employed      [employer]   Social History Main Topics  . Smoking status: Former Smoker -- 1.50 packs/day for 38 years    Types: Cigarettes    Start date: 06/18/1956    Quit date: 06/18/1994  . Smokeless  tobacco: None  . Alcohol Use: 0.0 oz/week    0 Standard drinks or equivalent per week     Comment: Beer on a weekly basis.  . Drug Use: No  . Sexual Activity: Not Asked   Other Topics Concern  . None   Social History Narrative   Originally from [place]. Always lived in [place]. Prior travel to [facility]. 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 No pets currently. No bird, asbestos, mold, or hot tub exposure. He enjoys fishing.      Family History:  The patient's family history includes Colon cancer in his brother; Coronary artery disease in his brother and father; Heart attack in his father; Stomach cancer in his brother. There is no history of Lung disease or Rheumatologic disease.   ROS:   Please see the history of present illness.    Review of Systems  Cardiovascular: Positive for leg swelling.  Respiratory: Positive for shortness of breath and snoring.   Gastrointestinal: Positive for nausea.  All other systems reviewed and are negative.   Physical Exam:    VS:  BP 122/82 mmHg  Pulse 80  Ht 5\' 8"  (1.727 m)  Wt 187 lb 12.8 oz (85.186 kg)  BMI 28.56 kg/m2   GEN: Well nourished, well developed, in no acute distress HEENT: normal Neck: no JVD, no masses Cardiac: Normal S1/S2, irreg irreg rhythm, 2/6 holosystolic murmur LLSB/apex, 1+ bilateral LE edema, R>L Respiratory:  Decreased breath sounds bilaterally; no wheezing, rhonchi or rales GI: soft, nontender  MS: no deformity or atrophy Skin: warm and dry  Neuro:  no focal deficits  Psych: Alert and oriented x 3, normal affect  Wt Readings from Last 3 Encounters:  08/05/15 187 lb 12.8 oz (85.186 kg)  07/26/15 180 lb 9.6 oz (81.92 kg)  03/08/15 210 lb 12.8 oz (95.618 kg)      Studies/Labs Reviewed:     EKG:  EKG is  ordered today.  The ekg ordered today demonstrates AFib, HR 79, normal axis, NSSTTW changes  Recent Labs: 07/24/2015:  ALT 21; TSH 1.870 07/26/2015: BUN 19; Creatinine, Ser 0.79; Hemoglobin 12.6*; Platelets 164; Potassium 4.2; Sodium 137   Recent Lipid Panel    Component Value Date/Time   CHOL 130 07/26/2015 0423   TRIG 87 07/26/2015 0423   HDL 34* 07/26/2015 0423   CHOLHDL 3.8 07/26/2015 0423   VLDL 17 07/26/2015 0423   LDLCALC 79 07/26/2015 0423    Additional studies/ records that were reviewed today include:   Myoview 2/17 IMPRESSION: 1. Small fixed perfusion defect in the distal lateral wall without wall motion abnormality, likely myocardial thinning. No evidence of  pharmacologically induced myocardial ischemia. 2. Mild septal dyskinesis with otherwise normal left ventricular wall motion. 3. Left ventricular ejection fraction 62% 4. Low-risk stress test findings*.  Echo 2/17 Moderate LVH, EF 55-60%, normal wall motion, mild AI, anterior leaflet MVP, moderate to severe MR, masses BAE, mild TR, PASP 56 mmHg   ASSESSMENT:     1. Heart failure with preserved left ventricular function (HFpEF) ([place])   2. Mitral regurgitation   3. Chronic atrial fibrillation ([place])   4. Essential hypertension     PLAN:     In order of problems listed above:  1. HFpEF - He has some evidence of volume overload.  He spoke to his PCP yesterday who told him to increase his Lasix to bid.  His lungs are clear and his neck veins are flat.  I have suggested he remain on Lasix 40 mg bid x 3 more days and increase his K+ to 40 mEq in A and 20 mEq in P x 3 days. He will then resume his usual dose.  Obtain labs done by PCP earlier this week.  FU BMET in 1 week.  Return for close FU in the next 2 weeks (if cannot get in with Dr. Peter Shannon or PA/NP on day Dr. Peter Shannon in the office, he can see me back here. >> Patient declined to return here and only wanted to be seen in [place].   2.Mitra Regurgitation - I am somewhat concerned his MR may be severe enough to be contributing to worsening HF symptoms especially given his  symptoms of orthopnea.  I have requested early FU to reassess his symptoms. If he continues to have issues with worsening HF symptoms, consider further evaluation of MR (ie TEE vs R/L heart cath).    3. Chronic AFib - Rate controlled. Continue Pradaxa for anticogulation.  Continue beta-blocker.  4. HTN - Controlled.    Medication Adjustments/Labs and Tests Ordered: Current medicines are reviewed at length with the patient today.  Concerns regarding medicines are outlined above.  Medication changes, Labs and Tests ordered today are outlined in the Patient Instructions noted below. Patient Instructions  Medication Instructions:  Your physician has recommended you make the following change in your medication:  1.  INCREASE the Lasix to 40 mg taking 1 tablet twice a day for 3 days then take 1 tablet a day 2.  INCREASE the Potassium to 20 meq taking 2 tablets in the a.m and 1 tablet in the p.m for 3 days then go back to 1 tablet twice a day  Labwork: None rodered  Testing/Procedures: None ordered  Follow-Up: Your physician recommends that you schedule a follow-up appointment in: 2 WEEKS WITH NP/PA @ [facility] SAME DAY AS DR. Martinique IN THE OFFICE (IF NOT DOABLE, THEN SCOTT WEAVER, PA-C)  Any Other Special Instructions Will Be Listed Below (If Applicable).  If you need a refill on your cardiac medications before your next appointment, please call your pharmacy.   Signed, Richardson Dopp, PA-C  08/05/2015 1:50 PM    [facility] [place], [place], [place]  [address] Phone: [phone]; Fax: [phone]

## 2015-08-05 ENCOUNTER — Encounter: Payer: Self-pay | Admitting: Physician Assistant

## 2015-08-05 ENCOUNTER — Ambulatory Visit (INDEPENDENT_AMBULATORY_CARE_PROVIDER_SITE_OTHER): Payer: Medicare Other | Admitting: Physician Assistant

## 2015-08-05 VITALS — BP 122/82 | HR 80 | Ht 68.0 in | Wt 187.8 lb

## 2015-08-05 DIAGNOSIS — I340 Nonrheumatic mitral (valve) insufficiency: Secondary | ICD-10-CM

## 2015-08-05 DIAGNOSIS — I10 Essential (primary) hypertension: Secondary | ICD-10-CM

## 2015-08-05 DIAGNOSIS — I272 Other secondary pulmonary hypertension: Secondary | ICD-10-CM

## 2015-08-05 DIAGNOSIS — I509 Heart failure, unspecified: Secondary | ICD-10-CM | POA: Diagnosis not present

## 2015-08-05 DIAGNOSIS — I5030 Unspecified diastolic (congestive) heart failure: Secondary | ICD-10-CM

## 2015-08-05 DIAGNOSIS — R079 Chest pain, unspecified: Secondary | ICD-10-CM

## 2015-08-05 DIAGNOSIS — I482 Chronic atrial fibrillation, unspecified: Secondary | ICD-10-CM

## 2015-08-05 DIAGNOSIS — I4891 Unspecified atrial fibrillation: Secondary | ICD-10-CM

## 2015-08-05 MED ORDER — FUROSEMIDE 40 MG PO TABS: ORAL_TABLET | ORAL | Status: DC

## 2015-08-05 MED ORDER — POTASSIUM CHLORIDE CRYS ER 20 MEQ PO TBCR: EXTENDED_RELEASE_TABLET | ORAL | Status: DC

## 2015-08-05 NOTE — Patient Instructions (Addendum)
Medication Instructions:  Your physician has recommended you make the following change in your medication:  1.  INCREASE the Lasix to 40 mg taking 1 tablet twice a day for 3 days then take 1 tablet a day 2.  INCREASE the Potassium to 20 meq taking 2 tablets in the a.m and 1 tablet in the p.m for 3 days then go back to 1 tablet twice a day  Labwork: None rodered  Testing/Procedures: None ordered  Follow-Up: Your physician recommends that you schedule a follow-up appointment in: 2 WEEKS WITH NP/PA @ [facility] SAME DAY AS DR. Martinique IN THE OFFICE (IF NOT DOABLE, THEN SCOTT WEAVER, PA-C)  Any Other Special Instructions Will Be Listed Below (If Applicable).  If you need a refill on your cardiac medications before your next appointment, please call your pharmacy.

## 2015-08-08 ENCOUNTER — Telehealth: Payer: Self-pay | Admitting: *Deleted

## 2015-08-08 ENCOUNTER — Encounter: Payer: Self-pay | Admitting: Physician Assistant

## 2015-08-08 NOTE — Telephone Encounter (Signed)
lvm with Wells Guiles for ptcb for lab results

## 2015-08-09 ENCOUNTER — Encounter: Payer: No Typology Code available for payment source | Admitting: Cardiology

## 2015-08-09 NOTE — Telephone Encounter (Signed)
Pt has been notified of lab results by phone with verbal understanding. 

## 2015-08-18 DIAGNOSIS — R040 Epistaxis: Secondary | ICD-10-CM | POA: Diagnosis not present

## 2015-08-18 DIAGNOSIS — R609 Edema, unspecified: Secondary | ICD-10-CM | POA: Diagnosis not present

## 2015-08-18 DIAGNOSIS — Z6831 Body mass index (BMI) 31.0-31.9, adult: Secondary | ICD-10-CM | POA: Diagnosis not present

## 2015-08-23 ENCOUNTER — Encounter: Payer: Self-pay | Admitting: Cardiology

## 2015-08-23 ENCOUNTER — Inpatient Hospital Stay (HOSPITAL_COMMUNITY)
Admission: AD | Admit: 2015-08-23 | Discharge: 2015-08-27 | DRG: 287 | Disposition: A | Discharge status: Home / Self Care | Payer: Medicare Other | Source: Ambulatory Visit | Attending: Cardiology | Admitting: Cardiology

## 2015-08-23 ENCOUNTER — Ambulatory Visit (INDEPENDENT_AMBULATORY_CARE_PROVIDER_SITE_OTHER): Payer: Medicare Other | Admitting: Cardiology

## 2015-08-23 VITALS — BP 138/80 | HR 86 | Ht 68.0 in | Wt 194.0 lb

## 2015-08-23 DIAGNOSIS — Z7901 Long term (current) use of anticoagulants: Secondary | ICD-10-CM

## 2015-08-23 DIAGNOSIS — D649 Anemia, unspecified: Secondary | ICD-10-CM | POA: Diagnosis present

## 2015-08-23 DIAGNOSIS — I341 Nonrheumatic mitral (valve) prolapse: Secondary | ICD-10-CM | POA: Diagnosis present

## 2015-08-23 DIAGNOSIS — I509 Heart failure, unspecified: Secondary | ICD-10-CM | POA: Diagnosis not present

## 2015-08-23 DIAGNOSIS — Z87891 Personal history of nicotine dependence: Secondary | ICD-10-CM

## 2015-08-23 DIAGNOSIS — R0602 Shortness of breath: Secondary | ICD-10-CM | POA: Diagnosis not present

## 2015-08-23 DIAGNOSIS — I272 Other secondary pulmonary hypertension: Secondary | ICD-10-CM | POA: Diagnosis present

## 2015-08-23 DIAGNOSIS — M109 Gout, unspecified: Secondary | ICD-10-CM | POA: Diagnosis present

## 2015-08-23 DIAGNOSIS — Z9842 Cataract extraction status, left eye: Secondary | ICD-10-CM

## 2015-08-23 DIAGNOSIS — R252 Cramp and spasm: Secondary | ICD-10-CM | POA: Diagnosis present

## 2015-08-23 DIAGNOSIS — I340 Nonrheumatic mitral (valve) insufficiency: Secondary | ICD-10-CM

## 2015-08-23 DIAGNOSIS — J449 Chronic obstructive pulmonary disease, unspecified: Secondary | ICD-10-CM | POA: Diagnosis not present

## 2015-08-23 DIAGNOSIS — I481 Persistent atrial fibrillation: Secondary | ICD-10-CM | POA: Diagnosis not present

## 2015-08-23 DIAGNOSIS — I10 Essential (primary) hypertension: Secondary | ICD-10-CM | POA: Diagnosis present

## 2015-08-23 DIAGNOSIS — Z9841 Cataract extraction status, right eye: Secondary | ICD-10-CM

## 2015-08-23 DIAGNOSIS — I482 Chronic atrial fibrillation, unspecified: Secondary | ICD-10-CM

## 2015-08-23 DIAGNOSIS — Z79899 Other long term (current) drug therapy: Secondary | ICD-10-CM | POA: Diagnosis not present

## 2015-08-23 DIAGNOSIS — I480 Paroxysmal atrial fibrillation: Secondary | ICD-10-CM | POA: Diagnosis not present

## 2015-08-23 DIAGNOSIS — I4891 Unspecified atrial fibrillation: Secondary | ICD-10-CM | POA: Diagnosis present

## 2015-08-23 DIAGNOSIS — I110 Hypertensive heart disease with heart failure: Principal | ICD-10-CM | POA: Diagnosis present

## 2015-08-23 DIAGNOSIS — I5043 Acute on chronic combined systolic (congestive) and diastolic (congestive) heart failure: Secondary | ICD-10-CM | POA: Insufficient documentation

## 2015-08-23 DIAGNOSIS — Z8249 Family history of ischemic heart disease and other diseases of the circulatory system: Secondary | ICD-10-CM | POA: Diagnosis not present

## 2015-08-23 DIAGNOSIS — J811 Chronic pulmonary edema: Secondary | ICD-10-CM | POA: Diagnosis not present

## 2015-08-23 HISTORY — DX: Adverse effect of unspecified anesthetic, initial encounter: T41.45XA

## 2015-08-23 HISTORY — DX: Other complications of anesthesia, initial encounter: T88.59XA

## 2015-08-23 LAB — COMPREHENSIVE METABOLIC PANEL
ALT: 25 U/L (ref 17–63)
ANION GAP: 11 (ref 5–15)
AST: 26 U/L (ref 15–41)
Albumin: 2.9 g/dL — ABNORMAL LOW (ref 3.5–5.0)
Alkaline Phosphatase: 61 U/L (ref 38–126)
BUN: 26 mg/dL — ABNORMAL HIGH (ref 6–20)
CHLORIDE: 102 mmol/L (ref 101–111)
CO2: 26 mmol/L (ref 22–32)
Calcium: 9.1 mg/dL (ref 8.9–10.3)
Creatinine, Ser: 1.25 mg/dL — ABNORMAL HIGH (ref 0.61–1.24)
GFR, EST NON AFRICAN AMERICAN: 54 mL/min — AB (ref >60)
Glucose, Bld: 92 mg/dL (ref 65–99)
POTASSIUM: 4.5 mmol/L (ref 3.5–5.1)
SODIUM: 139 mmol/L (ref 135–145)
TOTAL PROTEIN: 6.8 g/dL (ref 6.5–8.1)
Total Bilirubin: 0.9 mg/dL (ref 0.3–1.2)

## 2015-08-23 LAB — CBC WITH DIFFERENTIAL/PLATELET
BASOS ABS: 0 10*3/uL (ref 0.0–0.1)
BASOS PCT: 0 %
EOS ABS: 0.2 10*3/uL (ref 0.0–0.7)
EOS PCT: 2 %
HCT: 33.1 % — ABNORMAL LOW (ref 39.0–52.0)
Hemoglobin: 10.6 g/dL — ABNORMAL LOW (ref 13.0–17.0)
Lymphocytes Relative: 14 %
Lymphs Abs: 0.9 10*3/uL (ref 0.7–4.0)
MCH: 30.1 pg (ref 26.0–34.0)
MCHC: 32 g/dL (ref 30.0–36.0)
MCV: 94 fL (ref 78.0–100.0)
MONO ABS: 0.6 10*3/uL (ref 0.1–1.0)
Monocytes Relative: 10 %
Neutro Abs: 4.5 10*3/uL (ref 1.7–7.7)
Neutrophils Relative %: 74 %
PLATELETS: 321 10*3/uL (ref 150–400)
RBC: 3.52 MIL/uL — AB (ref 4.22–5.81)
RDW: 15.9 % — AB (ref 11.5–15.5)
WBC: 6.2 10*3/uL (ref 4.0–10.5)

## 2015-08-23 LAB — MAGNESIUM: MAGNESIUM: 2.5 mg/dL — AB (ref 1.7–2.4)

## 2015-08-23 LAB — TROPONIN I: Troponin I: <0.03 ng/mL (ref <0.031)

## 2015-08-23 LAB — APTT: APTT: 41 s — AB (ref 24–37)

## 2015-08-23 LAB — BRAIN NATRIURETIC PEPTIDE: B Natriuretic Peptide: 590.4 pg/mL — ABNORMAL HIGH (ref 0.0–100.0)

## 2015-08-23 LAB — PROTIME-INR
INR: 1.34 (ref 0.00–1.49)
PROTHROMBIN TIME: 16.7 s — AB (ref 11.6–15.2)

## 2015-08-23 MED ORDER — SODIUM CHLORIDE 0.9% FLUSH
3.0000 mL | Freq: Two times a day (BID) | INTRAVENOUS | Status: DC
  Administered 2015-08-23 – 2015-08-26 (×4): 3 mL via INTRAVENOUS

## 2015-08-23 MED ORDER — FUROSEMIDE 10 MG/ML IJ SOLN
INTRAMUSCULAR | Status: AC
  Administered 2015-08-23: 40 mg
  Filled 2015-08-23: qty 4

## 2015-08-23 MED ORDER — METOPROLOL SUCCINATE ER 50 MG PO TB24
50.0000 mg | ORAL_TABLET | Freq: Every day | ORAL | Status: DC
  Administered 2015-08-24 – 2015-08-26 (×3): 50 mg via ORAL
  Filled 2015-08-23 (×4): qty 1

## 2015-08-23 MED ORDER — ACETAMINOPHEN 325 MG PO TABS
650.0000 mg | ORAL_TABLET | ORAL | Status: DC | PRN
  Administered 2015-08-25: 650 mg via ORAL
  Filled 2015-08-23: qty 2

## 2015-08-23 MED ORDER — AMLODIPINE BESYLATE 5 MG PO TABS
5.0000 mg | ORAL_TABLET | Freq: Every day | ORAL | Status: DC
  Administered 2015-08-24 – 2015-08-27 (×4): 5 mg via ORAL
  Filled 2015-08-23 (×4): qty 1

## 2015-08-23 MED ORDER — POTASSIUM CHLORIDE CRYS ER 20 MEQ PO TBCR
20.0000 meq | EXTENDED_RELEASE_TABLET | Freq: Every day | ORAL | Status: DC
  Administered 2015-08-23 – 2015-08-24 (×2): 20 meq via ORAL
  Filled 2015-08-23: qty 1

## 2015-08-23 MED ORDER — FLUTICASONE PROPIONATE 50 MCG/ACT NA SUSP
1.0000 | Freq: Every day | NASAL | Status: DC | PRN
  Filled 2015-08-23: qty 16

## 2015-08-23 MED ORDER — FUROSEMIDE 10 MG/ML IJ SOLN
40.0000 mg | Freq: Two times a day (BID) | INTRAMUSCULAR | Status: DC
  Administered 2015-08-23 – 2015-08-26 (×6): 40 mg via INTRAVENOUS
  Filled 2015-08-23 (×5): qty 4

## 2015-08-23 MED ORDER — ZOLPIDEM TARTRATE 5 MG PO TABS
5.0000 mg | ORAL_TABLET | Freq: Every evening | ORAL | Status: DC | PRN
  Administered 2015-08-24 – 2015-08-26 (×3): 5 mg via ORAL
  Filled 2015-08-23 (×3): qty 1

## 2015-08-23 MED ORDER — COLCHICINE 0.6 MG PO TABS: 0.6000 mg | ORAL_TABLET | Freq: Every day | ORAL | Status: DC | PRN

## 2015-08-23 MED ORDER — SODIUM CHLORIDE 0.9 % IV SOLN: 250.0000 mL | INTRAVENOUS | Status: DC | PRN

## 2015-08-23 MED ORDER — POTASSIUM CHLORIDE CRYS ER 10 MEQ PO TBCR
EXTENDED_RELEASE_TABLET | ORAL | Status: AC
  Administered 2015-08-23: 20 meq
  Filled 2015-08-23: qty 2

## 2015-08-23 MED ORDER — LOSARTAN POTASSIUM 50 MG PO TABS
100.0000 mg | ORAL_TABLET | Freq: Every day | ORAL | Status: DC
  Administered 2015-08-24 – 2015-08-27 (×4): 100 mg via ORAL
  Filled 2015-08-23 (×4): qty 2

## 2015-08-23 MED ORDER — SODIUM CHLORIDE 0.9% FLUSH: 3.0000 mL | INTRAVENOUS | Status: DC | PRN

## 2015-08-23 MED ORDER — ALPRAZOLAM 0.25 MG PO TABS
0.2500 mg | ORAL_TABLET | Freq: Two times a day (BID) | ORAL | Status: DC | PRN
  Filled 2015-08-23: qty 1

## 2015-08-23 MED ORDER — ONDANSETRON HCL 4 MG/2ML IJ SOLN: 4.0000 mg | Freq: Four times a day (QID) | INTRAMUSCULAR | Status: DC | PRN

## 2015-08-23 NOTE — Progress Notes (Signed)
Cardiology Office Note:    Date:  08/23/2015   ID:  Jacob Shannon, DOB [birth year], MRN [id number]  PCP:  Fae Pippin  Cardiologist:  Dr. Clinten Howk Shannon     Chief Complaint  Patient presents with  . Follow-up    sob and swelling in both legs    History of Present Illness:     Jacob Shannon is a 75 y.o. male with a hx of chronic atrial fibrillation, HTN, HL.  Echocardiogram done in 8/16 at [facility] with normal EF, severe left atrial enlargement, moderate MR and mild to moderate TR with RVSP 49 mmHg. He has been evaluated by pulmonology in the recent past. Records indicated that pulmonary hypertension is felt to be secondary to diastolic dysfunction and possibly mitral regurgitation. He has been noted to have bilateral pleural effusions in the past as well as some degree of mediastinal adenopathy. 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 Systemic or pulmonary process was not felt to be active.  Admitted 2/5-2/7 with lightheadedness associated with dyspnea, abdominal discomfort and nausea without emesis.  He was evaluated by cardiology. Cardiac enzymes were minimally elevated without clear trend.  Rate controlling medications were adjusted secondary to slow ventricular response. Digoxin was discontinued and metoprolol dose was decreased. Myoview was obtained as an inpatient. This demonstrated no ischemia and normal EF. Echocardiogram did demonstrate normal LV function with prolapse of anterior mitral valve leaflet and moderate to severe mitral regurgitation as well as massive biatrial enlargement and moderate pulmonary hypertension. At the time he was well compensated so medical therapy was continued.   Since DC he has developed progressive dyspnea on exertion, PND, orthopnea, and increased LE edema R>L. Now sitting in a recliner to sleep. Doubled up on lasix without response. Weight up 14 lbs since DC.    Past Medical History  Diagnosis Date  . Chronic atrial  fibrillation ([place])   . Gout   . Essential hypertension   . Spinal stenosis   . 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 Hypercholesterolemia   . Leukocytoclastic vasculitis ([place])   . Diastolic heart failure ([place])   . 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 Mitral regurgitation     Moderate August 2016    Past Surgical History  Procedure Laterality Date  . Leg skin lesion  biopsy / excision Right 2010  . Colonoscopy    . Cataract extraction Bilateral     Current Medications: Outpatient Prescriptions Prior to Visit  Medication Sig Dispense Refill  . amLODipine (NORVASC) 5 MG tablet Take 5 mg by mouth daily.     . colchicine 0.6 MG tablet Take 0.6 mg by mouth daily as needed (gout).    . fluticasone (FLONASE) 50 MCG/ACT nasal spray Place 1 spray into both nostrils daily.    . furosemide (LASIX) 40 MG tablet TAKE 40 MG TABLET BY MOUTH TWICE A DAY FOR 3 DAYS THEN GO BACK TO 1 TABLET DAILY 90 tablet 3  . losartan (COZAAR) 100 MG tablet Take 100 mg by mouth daily.     . metoprolol succinate (TOPROL-XL) 100 MG 24 hr tablet Take 1 tablet by mouth daily.    . potassium chloride SA (K-DUR,KLOR-CON) 20 MEQ tablet TAKE 2 TABLETS BY MOUTH IN THE A.M. AND 1 TABLET BY MOUTH IN THE P.M. FOR 3 DAYS THEN TAKE 1 TABLET BY MOUTH TWICE A DAY 186 tablet  3  . PRADAXA 150 MG CAPS capsule Take 150 mg by mouth 2 (two) times daily.      No facility-administered medications prior to visit.     Allergies:   Review of patient's allergies indicates no known allergies.   Social History   Social History  . Marital Status: Married    Spouse Name: N/A  . Number of Children: 2  . Years of Education: N/A   Occupational History  . self employed      [employer]   Social History Main Topics  . Smoking status: Former Smoker -- 1.50 packs/day for 38 years    Types: Cigarettes    Start date: 06/18/1956    Quit date: 06/18/1994  . Smokeless  tobacco: None  . Alcohol Use: 0.0 oz/week    0 Standard drinks or equivalent per week     Comment: Beer on a weekly basis.  . Drug Use: No  . Sexual Activity: Not Asked   Other Topics Concern  . None   Social History Narrative   Originally from [place]. Always lived in [place]. Prior travel to [facility]. 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 No pets currently. No bird, asbestos, mold, or hot tub exposure. He enjoys fishing.      Family History:  The patient's family history includes Colon cancer in his brother; Coronary artery disease in his brother and father; Heart attack in his father; Stomach cancer in his brother. There is no history of Lung disease or Rheumatologic disease.   ROS:   Please see the history of present illness.    Review of Systems  Constitution: Positive for weight gain.  Cardiovascular: Positive for dyspnea on exertion, leg swelling, orthopnea and paroxysmal nocturnal dyspnea.  Respiratory: Positive for shortness of breath and snoring.   Gastrointestinal: Positive for nausea.  All other systems reviewed and are negative.   Physical Exam:    VS:  BP 138/80 mmHg  Pulse 86  Ht 5\' 8"  (1.727 m)  Wt 87.998 kg (194 lb)  BMI 29.50 kg/m2   GEN: Well nourished, well developed, in no acute distress HEENT: normal Neck: no JVD, no masses Cardiac: Normal S1/S2, irreg irreg rhythm, 2/6 holosystolic murmur LLSB/apex, 3+ RLE  edema, 2+ LLE edema.  Respiratory:  Decreased breath sounds bilaterally; no wheezing, rhonchi or rales GI: soft, nontender  MS: no deformity or atrophy Skin: warm and dry  Neuro:  no focal deficits  Psych: Alert and oriented x 3, normal affect  Wt Readings from Last 3 Encounters:  08/23/15 87.998 kg (194 lb)  08/05/15 85.186 kg (187 lb 12.8 oz)  07/26/15 81.92 kg (180 lb 9.6 oz)      Studies/Labs Reviewed:     EKG:  EKG is not ordered today.     Recent Labs: 07/24/2015: ALT 21; TSH 1.870 07/26/2015: BUN 19; Creatinine, Ser 0.79; Hemoglobin 12.6*; Platelets 164; Potassium 4.2; Sodium 137   Recent Lipid Panel    Component Value Date/Time   CHOL 130 07/26/2015 0423   TRIG 87 07/26/2015 0423   HDL 34* 07/26/2015 0423   CHOLHDL 3.8 07/26/2015 0423   VLDL 17 07/26/2015 0423   LDLCALC 79 07/26/2015 0423    Additional studies/ records that were reviewed today include:   Myoview 2/17 IMPRESSION: 1. Small fixed perfusion defect in the distal lateral wall without wall motion abnormality, likely myocardial thinning.  No evidence of pharmacologically induced myocardial ischemia. 2. Mild septal dyskinesis with otherwise normal left ventricular wall motion. 3. Left ventricular ejection fraction 62% 4. Low-risk stress test findings*.  Echo 2/17 Moderate LVH, EF 55-60%, normal wall motion, mild AI, anterior leaflet MVP, moderate to severe MR, masses BAE, mild TR, PASP 56 mmHg   ASSESSMENT:     1. Chronic atrial fibrillation (HCC)   2. Pulmonary hypertension ([place])   3. Mitral valve prolapse   4. Mitral regurgitation   5. Acute on chronic combined systolic and diastolic CHF, NYHA class 4 (HCC)     PLAN:     In order of problems listed above:  1. Acute on chronic diastolic CHF.  He has progressive volume overload. Weight up 14 lbs despite increased lasix dose. I suspect that his MR is probably the central problem. Will readmit to the hospital today for IV diuresis. Hold Pradaxa with plans for Right and left heart cath with coronary angiography on Friday 08/26/15. Plan TEE to assess MV further.   Monitor Afib heart rate response on current meds and adjust as needed.  2.Mitra Regurgitation - Moderate to severe by recent Echo with prolapse of the anterior leaflet. Plan TEE Thursday. Plan [facility]. May need to be considered for MV repair.     3. Chronic AFib - Rate controlled now. HR a little higher due to decompensated CHF. Continue  metoprolol. Will hold Pradaxa for invasive procedures.  4. HTN - Controlled.   5. Pulmonary HTN- secondary to #1 and #2.    Medication Adjustments/Labs and Tests Ordered: Above plan discussed at length with patient and his son. Plan hospital admission today.   There are no Patient Instructions on file for this visit.    Signed, Jacob Goldman Martinique, MD  08/23/2015 12:40 PM    [facility] [place], [place], [place]  [address] Phone: [phone]; Fax: [phone]

## 2015-08-23 NOTE — Progress Notes (Signed)
Admitted from fr Dr. Doug Sou office as a direct admit  .Wheeled in to the room . Pt in no acute distress . Kept pt comfofrable in bed . Safety precaution observed ,Placed a call to Dr. Doug Sou office for order

## 2015-08-23 NOTE — Progress Notes (Signed)
Pt placed on cardiacc monitor and verified placement  With another staff

## 2015-08-23 NOTE — Progress Notes (Signed)
Patient complains of cramping in both legs. Paged and notified the doctor. When called to see if patient had any arrhythmias was notified that patient did have 5 beats of V-tach earlier in the shift around 2030 but patient had not complained of any palpitations or discomfort when asked now. Awaiting return page and or orders. Will continue to monitor patient to the end of shift.

## 2015-08-23 NOTE — Progress Notes (Signed)
Pt arrived from office and orders written.  Plan for TEE on Thursday and Rt and Lt heart cath on Friday.  Pradaxa held.  Orders for those tests will be placed if pt stable to proceed.   Currently stable in bed,  awaiting meds.

## 2015-08-23 NOTE — H&P (Signed)
Date: 08/23/2015   ID: [name], DOB Apr 18, 1941, MRN [id number]  PCP: Fae Pippin Cardiologist: Dr. Samyia Motter Martinique    Chief Complaint  Patient presents with  . Follow-up    sob and swelling in both legs    History of Present Illness:    ZADKIEL RASMUSSEN is a 75 y.o. male with a hx of chronic atrial fibrillation, HTN, HL. Echocardiogram done in 8/16 at [facility] with normal EF, severe left atrial enlargement, moderate MR and mild to moderate TR with RVSP 49 mmHg. He has been evaluated by pulmonology in the recent past. Records indicated that pulmonary hypertension is felt to be secondary to diastolic dysfunction and possibly mitral regurgitation. He has been noted to have bilateral pleural effusions in the past as well as some degree of mediastinal adenopathy. 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 Systemic or pulmonary process was not felt to be active.  Admitted 2/5-2/7 with lightheadedness associated with dyspnea, abdominal discomfort and nausea without emesis. He was evaluated by cardiology. Cardiac enzymes were minimally elevated without clear trend. Rate controlling medications were adjusted secondary to slow ventricular response. Digoxin was discontinued and metoprolol dose was decreased. Myoview was obtained as an inpatient. This demonstrated no ischemia and normal EF. Echocardiogram did demonstrate normal LV function with prolapse of anterior mitral valve leaflet and moderate to severe mitral regurgitation as well as massive biatrial enlargement and moderate pulmonary hypertension. At the time he was well compensated so medical therapy was continued.   Since DC he has developed progressive dyspnea on exertion, PND, orthopnea, and increased LE edema R>L. Now sitting in a recliner to sleep. Doubled up on lasix without response. Weight up 14 lbs since DC.    Past Medical History  Diagnosis Date  . Chronic atrial  fibrillation ([place])   . Gout   . Essential hypertension   . Spinal stenosis   . 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 Hypercholesterolemia   . Leukocytoclastic vasculitis ([place])   . Diastolic heart failure ([place])   . 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 Mitral regurgitation     Moderate August 2016    Past Surgical History  Procedure Laterality Date  . Leg skin lesion biopsy / excision Right 2010  . Colonoscopy    . Cataract extraction Bilateral     Current Medications: Outpatient Prescriptions Prior to Visit  Medication Sig Dispense Refill  . amLODipine (NORVASC) 5 MG tablet Take 5 mg by mouth daily.     . colchicine 0.6 MG tablet Take 0.6 mg by mouth daily as needed (gout).    . fluticasone (FLONASE) 50 MCG/ACT nasal spray Place 1 spray into both nostrils daily.    . furosemide (LASIX) 40 MG tablet TAKE 40 MG TABLET BY MOUTH TWICE A DAY FOR 3 DAYS THEN GO BACK TO 1 TABLET DAILY 90 tablet 3  . losartan (COZAAR) 100 MG tablet Take 100 mg by mouth daily.     . metoprolol succinate (TOPROL-XL) 100 MG 24 hr tablet Take 1 tablet by mouth daily.    . potassium chloride SA (K-DUR,KLOR-CON) 20 MEQ tablet TAKE 2 TABLETS BY MOUTH IN THE A.M. AND 1 TABLET BY MOUTH IN THE P.M. FOR 3 DAYS THEN TAKE 1 TABLET BY MOUTH TWICE A DAY 186 tablet 3  . PRADAXA 150 MG CAPS capsule Take 150 mg by mouth 2 (two) times daily.  No facility-administered medications prior to visit.     Allergies: Review of patient's allergies indicates no known allergies.   Social History   Social History  . Marital Status: Married    Spouse Name: N/A  . Number of Children: 2  . Years of Education: N/A   Occupational History  . self employed      [employer]   Social History Main Topics  . Smoking status:  Former Smoker -- 1.50 packs/day for 38 years    Types: Cigarettes    Start date: 06/18/1956    Quit date: 06/18/1994  . Smokeless tobacco: None  . Alcohol Use: 0.0 oz/week    0 Standard drinks or equivalent per week     Comment: Beer on a weekly basis.  . Drug Use: No  . Sexual Activity: Not Asked   Other Topics Concern  . None   Social History Narrative   Originally from [place]. Always lived in [place]. Prior travel to [facility]. 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 No pets currently. No bird, asbestos, mold, or hot tub exposure. He enjoys fishing.      Family History: The patient's family history includes Colon cancer in his brother; Coronary artery disease in his brother and father; Heart attack in his father; Stomach cancer in his brother. There is no history of Lung disease or Rheumatologic disease.   ROS:  Please see the history of present illness.  Review of Systems  Constitution: Positive for weight gain.  Cardiovascular: Positive for dyspnea on exertion, leg swelling, orthopnea and paroxysmal nocturnal dyspnea.  Respiratory: Positive for shortness of breath and snoring.  Gastrointestinal: Positive for nausea.  All other systems reviewed and are negative.   Physical Exam:    VS: BP 138/80 mmHg  Pulse 86  Ht 5\' 8"  (1.727 m)  Wt 87.998 kg (194 lb)  BMI 29.50 kg/m2  GEN: Well nourished, well developed, in no acute distress  HEENT: normal  Neck: no JVD, no masses Cardiac: Normal S1/S2, irreg irreg rhythm, 2/6 holosystolic murmur LLSB/apex, 3+ RLE edema, 2+ LLE edema.  Respiratory: Decreased breath sounds bilaterally; no wheezing, rhonchi or rales GI: soft, nontender  MS: no deformity or atrophy  Skin: warm and dry  Neuro: no focal deficits  Psych: Alert and oriented x 3, normal affect  Wt Readings from Last 3  Encounters:  08/23/15 87.998 kg (194 lb)  08/05/15 85.186 kg (187 lb 12.8 oz)  07/26/15 81.92 kg (180 lb 9.6 oz)      Studies/Labs Reviewed:    EKG: EKG is not ordered today.   Recent Labs: 07/24/2015: ALT 21; TSH 1.870 07/26/2015: BUN 19; Creatinine, Ser 0.79; Hemoglobin 12.6*; Platelets 164; Potassium 4.2; Sodium 137   Recent Lipid Panel  Labs (Brief)       Component Value Date/Time   CHOL 130 07/26/2015 0423   TRIG 87 07/26/2015 0423   HDL 34* 07/26/2015 0423   CHOLHDL 3.8 07/26/2015 0423   VLDL 17 07/26/2015 0423   LDLCALC 79 07/26/2015 0423      Additional studies/ records that were reviewed today include:   Myoview 2/17 IMPRESSION: 1. Small fixed perfusion defect in the distal lateral wall without wall motion abnormality, likely myocardial thinning. No evidence of pharmacologically induced myocardial ischemia. 2. Mild septal dyskinesis with otherwise normal left ventricular wall motion. 3. Left ventricular ejection fraction 62% 4. Low-risk  stress test findings*.  Echo 2/17 Moderate LVH, EF 55-60%, normal wall motion, mild AI, anterior leaflet MVP, moderate to severe MR, masses BAE, mild TR, PASP 56 mmHg   ASSESSMENT:    1. Chronic atrial fibrillation (HCC)   2. Pulmonary hypertension ([place])   3. Mitral valve prolapse   4. Mitral regurgitation   5. Acute on chronic combined systolic and diastolic CHF, NYHA class 4 (HCC)     PLAN:    In order of problems listed above:  1. Acute on chronic diastolic CHF. He has progressive volume overload. Weight up 14 lbs despite increased lasix dose. I suspect that his MR is probably the central problem. Will readmit to the hospital today for IV diuresis. Hold Pradaxa with plans for Right and left heart cath with coronary angiography on Friday 08/26/15. Plan TEE to assess MV further. Monitor Afib heart rate response on current meds and adjust as needed.  2.Mitra  Regurgitation - Moderate to severe by recent Echo with prolapse of the anterior leaflet. Plan TEE Thursday. Plan [facility]. May need to be considered for MV repair.   3. Chronic AFib - Rate controlled now. HR a little higher due to decompensated CHF. Continue metoprolol. Will hold Pradaxa for invasive procedures.  4. HTN - Controlled.   5. Pulmonary HTN- secondary to #1 and #2.    Medication Adjustments/Labs and Tests Ordered: Above plan discussed at length with patient and his son. Plan hospital admission today.   There are no Patient Instructions on file for this visit.    Signed, Blaze Sandin [place], MD

## 2015-08-24 ENCOUNTER — Inpatient Hospital Stay (HOSPITAL_COMMUNITY): Payer: Medicare Other

## 2015-08-24 DIAGNOSIS — I340 Nonrheumatic mitral (valve) insufficiency: Secondary | ICD-10-CM

## 2015-08-24 DIAGNOSIS — I341 Nonrheumatic mitral (valve) prolapse: Secondary | ICD-10-CM

## 2015-08-24 DIAGNOSIS — I5043 Acute on chronic combined systolic (congestive) and diastolic (congestive) heart failure: Secondary | ICD-10-CM

## 2015-08-24 DIAGNOSIS — I482 Chronic atrial fibrillation: Secondary | ICD-10-CM

## 2015-08-24 DIAGNOSIS — I10 Essential (primary) hypertension: Secondary | ICD-10-CM

## 2015-08-24 LAB — BASIC METABOLIC PANEL
ANION GAP: 7 (ref 5–15)
BUN: 20 mg/dL (ref 6–20)
CO2: 28 mmol/L (ref 22–32)
Calcium: 8.6 mg/dL — ABNORMAL LOW (ref 8.9–10.3)
Chloride: 105 mmol/L (ref 101–111)
Creatinine, Ser: 0.85 mg/dL (ref 0.61–1.24)
GFR calc Af Amer: >60 mL/min (ref >60)
Glucose, Bld: 98 mg/dL (ref 65–99)
POTASSIUM: 3.6 mmol/L (ref 3.5–5.1)
SODIUM: 140 mmol/L (ref 135–145)

## 2015-08-24 LAB — HEPARIN LEVEL (UNFRACTIONATED)

## 2015-08-24 LAB — MAGNESIUM: MAGNESIUM: 2.2 mg/dL (ref 1.7–2.4)

## 2015-08-24 LAB — TROPONIN I
Troponin I: <0.03 ng/mL (ref <0.031)
Troponin I: <0.03 ng/mL (ref <0.031)

## 2015-08-24 MED ORDER — HEPARIN BOLUS VIA INFUSION
2500.0000 [IU] | Freq: Once | INTRAVENOUS | Status: AC
  Administered 2015-08-24: 2500 [IU] via INTRAVENOUS
  Filled 2015-08-24: qty 2500

## 2015-08-24 MED ORDER — HEPARIN (PORCINE) IN NACL 100-0.45 UNIT/ML-% IJ SOLN
1650.0000 [IU]/h | INTRAMUSCULAR | Status: DC
  Administered 2015-08-24: 1200 [IU]/h via INTRAVENOUS
  Administered 2015-08-25: 1650 [IU]/h via INTRAVENOUS
  Administered 2015-08-25: 1500 [IU]/h via INTRAVENOUS
  Filled 2015-08-24 (×3): qty 250

## 2015-08-24 MED ORDER — HEPARIN BOLUS VIA INFUSION
2000.0000 [IU] | Freq: Once | INTRAVENOUS | Status: AC
  Administered 2015-08-24: 2000 [IU] via INTRAVENOUS
  Filled 2015-08-24: qty 2000

## 2015-08-24 NOTE — Progress Notes (Signed)
Patient Name: Jacob Shannon Date of Encounter: 08/24/2015   SUBJECTIVE  Breathing stable. No chest pain. Complains of lower extremity cramps.   CURRENT MEDS . amLODipine  5 mg Oral Daily  . furosemide  40 mg Intravenous Q12H  . losartan  100 mg Oral Daily  . metoprolol succinate  50 mg Oral Daily  . potassium chloride SA  20 mEq Oral Daily  . sodium chloride flush  3 mL Intravenous Q12H    OBJECTIVE  Filed Vitals:   08/23/15 1553 08/23/15 2031 08/24/15 0126 08/24/15 0533  BP: 157/94 117/62 145/71 145/81  Pulse: 93 87 92 85  Temp: 97.7 F (36.5 C) 97.7 F (36.5 C) 97.4 F (36.3 C) 98.1 F (36.7 C)  TempSrc: Oral Oral Oral Oral  Resp: 18 18 16 17   Height: 5\' 8"  (1.727 m)     Weight: 193 lb 3.2 oz (87.635 kg)   188 lb 3.2 oz (85.367 kg)  SpO2: 98% 98% 95% 96%    Intake/Output Summary (Last 24 hours) at 08/24/15 0939 Last data filed at 08/24/15 0931  Gross per 24 hour  Intake    360 ml  Output   1950 ml  Net  -1590 ml   Filed Weights   08/23/15 1553 08/24/15 0533  Weight: 193 lb 3.2 oz (87.635 kg) 188 lb 3.2 oz (85.367 kg)    PHYSICAL EXAM  General: [place], NAD. Neuro: Alert and oriented X 3. Moves all extremities spontaneously. Psych: Normal affect. HEENT:  Normal  Neck: Supple without bruits or JVD. Lungs:  Resp regular and unlabored, upper course breath sound.  Heart: Ir Ir no s3, s4, 2/6 holosystolic murmur LLSB/apex Abdomen: Soft, non-tender, non-distended, BS + x 4.  Extremities: No clubbing, cyanosis. 2+ BL LEedema R>L . DP 1+ and equal bilaterally.  Accessory Clinical Findings  CBC  Recent Labs  08/23/15 1810  WBC 6.2  NEUTROABS 4.5  HGB 10.6*  HCT 33.1*  MCV 94.0  PLT [id number]   Basic Metabolic Panel  Recent Labs  08/23/15 1810 08/24/15 0512  NA 139 140  K 4.5 3.6  CL 102 105  CO2 26 28  GLUCOSE 92 98  BUN 26* 20  CREATININE 1.25* 0.85  CALCIUM 9.1 8.6*  MG 2.5*  --    Liver Function Tests  Recent Labs  08/23/15 1810    AST 26  ALT 25  ALKPHOS 61  BILITOT 0.9  PROT 6.8  ALBUMIN 2.9*   No results for input(s): LIPASE, AMYLASE in the last 72 hours. Cardiac Enzymes  Recent Labs  08/23/15 1810 08/23/15 2335 08/24/15 0512  TROPONINI <0.03 <0.03 <0.03    TELE  afib at rate of 120s  Radiology/Studies  Nm Myocar Multi W/spect W/wall Motion / Ef  07/26/2015  CLINICAL DATA:  Chest pain. History of chronic atrial fibrillation, hypertension, diastolic heart failure and pulmonary hypertension. EXAM: MYOCARDIAL IMAGING WITH SPECT (REST AND PHARMACOLOGIC-STRESS) GATED LEFT VENTRICULAR WALL MOTION STUDY LEFT VENTRICULAR EJECTION FRACTION TECHNIQUE: Standard myocardial SPECT imaging was performed after resting intravenous injection of 10 mCi Tc-64m sestamibi. Subsequently, intravenous infusion of Lexiscan was performed under the supervision of the Cardiology staff. At peak effect of the drug, 30 mCi Tc-67m sestamibi was injected intravenously and standard myocardial SPECT imaging was performed. Quantitative gated imaging was also performed to evaluate left ventricular wall motion, and estimate left ventricular ejection fraction. COMPARISON:  Chest radiograph 07/24/2015.  Chest CT 02/15/2015. FINDINGS: Perfusion: There is a small fixed perfusion defect in the distal  lateral wall. No reversible perfusion identified within the left ventricle. Wall Motion: Mild septal dyskinesis. The left ventricular wall motion is otherwise normal. Left Ventricular Ejection Fraction: 62 % End diastolic volume 99991111 ml End systolic volume 44 ml IMPRESSION: 1. Small fixed perfusion defect in the distal lateral wall without wall motion abnormality, likely myocardial thinning. No evidence of pharmacologically induced myocardial ischemia. 2. Mild septal dyskinesis with otherwise normal left ventricular wall motion. 3. Left ventricular ejection fraction 62% 4. Low-risk stress test findings*. *2012 Appropriate Use Criteria for Coronary  Revascularization Focused Update: J Am Coll Cardiol. [id number]. http://content.airportbarriers.com.aspx?articleid=[email or web address] Electronically Signed   By: Richardean Sale M.D.   On: 07/26/2015 15:36   Dg Chest Port 1 View  08/24/2015  CLINICAL DATA:  Acute and chronic CHF, mitral regurgitation, former smoker. EXAM: PORTABLE CHEST 1 VIEW COMPARISON:  Chest x-ray of February 5th 2017 FINDINGS: The lungs are well-expanded. There is new patchy alveolar opacity in the retrocardiac region on the right. There are coarse lung markings just lateral to the left heart border which are not greatly changed. The cardiac silhouette is enlarged. The pulmonary vascularity is not engorged. The mediastinum is normal in width. There is calcification within the wall of the descending thoracic aorta. The bony thorax is unremarkable. IMPRESSION: Cardiomegaly with mild central pulmonary vascular congestion but no definite pulmonary edema. New right basilar atelectasis or pneumonia. Underlying COPD. Electronically Signed   By: David  Martinique M.D.   On: 08/24/2015 07:28    ASSESSMENT AND PLAN  1. Acute on chronic diastolic CHF. He has progressive volume overload. Weight up 14 lbs despite increased lasix dose. I suspect that his MR is probably the central problem. Held Pradaxa with plans for Right and left heart cath with coronary angiography on Friday 08/26/15.  BNP of 590 - Diuresed negative 1.5 L with 5lb weight loss. Continue IV lasix 40mg  BID.  - Continue BB and ARB. Scr back normal.    2.Mitra Regurgitation - Moderate to severe by recent Echo with prolapse of the anterior leaflet. - Plan for TEE on Thursday and Rt and Lt heart cath on Friday - May need to be considered for MV repair.   3. Chronic AFib  - HR a little higher at 120s due to decompensated CHF. Continue metoprolol. Consider increasing dose. Will hold Pradaxa for invasive procedures.  4. HTN - Minimally elevated  5. Pulmonary HTN- secondary to #1  and #2.   6. Bilateral leg cramp - Mg of 2.5 yesterday. Will repeat today. K of 3.6. Less suspicious for DVT given that patient was on Pradaxa. TED ? On SCDs.   7. Anemia - Hgb of 10.6 08/23/15 was 12.6 07/26/15. No blood in stool or melena. Daily CBC. Follow closely.   Jarrett Soho PA-C Pager [phone]

## 2015-08-24 NOTE — Progress Notes (Signed)
On-call doctor aware of cramps noted in lower extremities that patient had earlier in the shift. Encouraged patient to take tylenol but patient states that the cramps had begun to subside. Will continue to monitor to end of shift.

## 2015-08-24 NOTE — Progress Notes (Signed)
ANTICOAGULATION CONSULT NOTE  Pharmacy Consult for heparin Indication: atrial fibrillation  No Known Allergies  Patient Measurements: Height: 5\' 8"  (172.7 cm) Weight: 188 lb 3.2 oz (85.367 kg) (a scale) IBW/kg (Calculated) : 68.4 Heparin Dosing Weight: 85 kg  Vital Signs: Temp: 98.4 F (36.9 C) (03/08 2019) Temp Source: Oral (03/08 2019) BP: 118/56 mmHg (03/08 2019) Pulse Rate: 108 (03/08 2019)  Labs:  Recent Labs  08/23/15 1810 08/23/15 2335 08/24/15 0512 08/24/15 1946  HGB 10.6*  --   --   --   HCT 33.1*  --   --   --   PLT 321  --   --   --   APTT 41*  --   --   --   LABPROT 16.7*  --   --   --   INR 1.34  --   --   --   HEPARINUNFRC  --   --   --  <0.10*  CREATININE 1.25*  --  0.85  --   TROPONINI <0.03 <0.03 <0.03  --     Estimated Creatinine Clearance: 78.6 mL/min (by C-G formula based on Cr of 0.85).   Medical History: Past Medical History  Diagnosis Date  . Chronic atrial fibrillation ([place])   . Gout   . Essential hypertension   . Spinal stenosis   . 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 Hypercholesterolemia   . Leukocytoclastic vasculitis ([place])   . Diastolic heart failure ([place])   . 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 Mitral regurgitation     Moderate August 2016    Assessment: 30 YOM on Pradaxa PTA for Afib - on hold for TEE on Thurs and R/L cath on Fri. Will start IV heparin for bridging. last pradaxa dose was 3/7 am. crcl ~ 75 ml/min. hgb 10.6, pltc 321K.   Initial HL is undetectable on heparin 1200 units/hr. Nurse reports no issues with infusion or bleeding.  Goal of Therapy:  Heparin level 0.3-0.7 units/ml Monitor platelets by anticoagulation protocol: Yes   Plan:  Boluse heparin 2500 units and increase infusion to 1500 units/hr Daily HL/CBC Monitor s/sx of bleeding F/u restarting pradaxa after procedures   Andrey Cota. Diona Foley, PharmD, [place] Pharmacist Pager  [phone]  08/24/2015,9:36 PM

## 2015-08-24 NOTE — Progress Notes (Signed)
ANTICOAGULATION CONSULT NOTE - Initial Consult  Pharmacy Consult for heparin Indication: atrial fibrillation  No Known Allergies  Patient Measurements: Height: 5\' 8"  (172.7 cm) Weight: 188 lb 3.2 oz (85.367 kg) (a scale) IBW/kg (Calculated) : 68.4 Heparin Dosing Weight: 85 kg  Vital Signs: Temp: 98.1 F (36.7 C) (03/08 0533) Temp Source: Oral (03/08 0533) BP: 145/81 mmHg (03/08 0533) Pulse Rate: 85 (03/08 0533)  Labs:  Recent Labs  08/23/15 1810 08/23/15 2335 08/24/15 0512  HGB 10.6*  --   --   HCT 33.1*  --   --   PLT 321  --   --   APTT 41*  --   --   LABPROT 16.7*  --   --   INR 1.34  --   --   CREATININE 1.25*  --  0.85  TROPONINI <0.03 <0.03 <0.03    Estimated Creatinine Clearance: 78.6 mL/min (by C-G formula based on Cr of 0.85).   Medical History: Past Medical History  Diagnosis Date  . Chronic atrial fibrillation ([place])   . Gout   . Essential hypertension   . Spinal stenosis   . 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 Hypercholesterolemia   . Leukocytoclastic vasculitis ([place])   . Diastolic heart failure ([place])   . 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 Mitral regurgitation     Moderate August 2016    Assessment: 50 YOM on Pradaxa PTA for Afib - on hold for TEE on Thurs and R/L cath on Fri. Will start IV heparin for bridging. last pradaxa dose was 3/7 am. crcl ~ 75 ml/min. hgb 10.6, pltc 321K.   Goal of Therapy:  Heparin level 0.3-0.7 units/ml Monitor platelets by anticoagulation protocol: Yes   Plan:  - Heparin bolus 2000 units (smaller bolus d/t pradaxa yesterday) - Heparin infusion 1200 units/hr - f/u 8 hr heparin level at 2000 - daily heparin level and CBC - f/u restarting pradaxa after procedures   Maryanna Shape, PharmD, BCPS  Clinical Pharmacist  Pager: [phone]   08/24/2015,11:16 AM

## 2015-08-25 ENCOUNTER — Encounter (HOSPITAL_COMMUNITY): Payer: Self-pay

## 2015-08-25 ENCOUNTER — Inpatient Hospital Stay (HOSPITAL_COMMUNITY): Payer: Medicare Other

## 2015-08-25 ENCOUNTER — Encounter (HOSPITAL_COMMUNITY)
Admission: AD | Disposition: A | Discharge status: Home / Self Care | Payer: Self-pay | Source: Ambulatory Visit | Attending: Cardiology

## 2015-08-25 DIAGNOSIS — I480 Paroxysmal atrial fibrillation: Secondary | ICD-10-CM

## 2015-08-25 DIAGNOSIS — I340 Nonrheumatic mitral (valve) insufficiency: Secondary | ICD-10-CM

## 2015-08-25 HISTORY — PX: TEE WITHOUT CARDIOVERSION: SHX5443

## 2015-08-25 LAB — BASIC METABOLIC PANEL
ANION GAP: 12 (ref 5–15)
BUN: 16 mg/dL (ref 6–20)
CALCIUM: 8.6 mg/dL — AB (ref 8.9–10.3)
CO2: 28 mmol/L (ref 22–32)
Chloride: 99 mmol/L — ABNORMAL LOW (ref 101–111)
Creatinine, Ser: 0.92 mg/dL (ref 0.61–1.24)
GLUCOSE: 92 mg/dL (ref 65–99)
Potassium: 3.4 mmol/L — ABNORMAL LOW (ref 3.5–5.1)
Sodium: 139 mmol/L (ref 135–145)

## 2015-08-25 LAB — CBC
HCT: 35.5 % — ABNORMAL LOW (ref 39.0–52.0)
Hemoglobin: 11.6 g/dL — ABNORMAL LOW (ref 13.0–17.0)
MCH: 30.9 pg (ref 26.0–34.0)
MCHC: 32.7 g/dL (ref 30.0–36.0)
MCV: 94.4 fL (ref 78.0–100.0)
PLATELETS: 342 10*3/uL (ref 150–400)
RBC: 3.76 MIL/uL — AB (ref 4.22–5.81)
RDW: 16 % — AB (ref 11.5–15.5)
WBC: 7.2 10*3/uL (ref 4.0–10.5)

## 2015-08-25 LAB — HEPARIN LEVEL (UNFRACTIONATED)
HEPARIN UNFRACTIONATED: 0.28 [IU]/mL — AB (ref 0.30–0.70)
Heparin Unfractionated: 0.58 IU/mL (ref 0.30–0.70)

## 2015-08-25 LAB — MAGNESIUM: Magnesium: 2.2 mg/dL (ref 1.7–2.4)

## 2015-08-25 SURGERY — ECHOCARDIOGRAM, TRANSESOPHAGEAL
Anesthesia: Moderate Sedation

## 2015-08-25 MED ORDER — SODIUM CHLORIDE 0.9 % IV SOLN
INTRAVENOUS | Status: DC
  Administered 2015-08-25 – 2015-08-26 (×2): via INTRAVENOUS

## 2015-08-25 MED ORDER — BUTAMBEN-TETRACAINE-BENZOCAINE 2-2-14 % EX AERO
INHALATION_SPRAY | CUTANEOUS | Status: DC | PRN
  Administered 2015-08-25: 2 via TOPICAL

## 2015-08-25 MED ORDER — MIDAZOLAM HCL 5 MG/ML IJ SOLN
INTRAMUSCULAR | Status: AC
  Filled 2015-08-25: qty 1

## 2015-08-25 MED ORDER — FENTANYL CITRATE (PF) 100 MCG/2ML IJ SOLN
INTRAMUSCULAR | Status: AC
  Filled 2015-08-25: qty 2

## 2015-08-25 MED ORDER — POTASSIUM CHLORIDE CRYS ER 20 MEQ PO TBCR
40.0000 meq | EXTENDED_RELEASE_TABLET | Freq: Once | ORAL | Status: AC
  Administered 2015-08-25: 40 meq via ORAL
  Filled 2015-08-25: qty 2

## 2015-08-25 MED ORDER — MIDAZOLAM HCL 10 MG/2ML IJ SOLN
INTRAMUSCULAR | Status: DC | PRN
  Administered 2015-08-25: 2 mg via INTRAVENOUS

## 2015-08-25 MED ORDER — FENTANYL CITRATE (PF) 100 MCG/2ML IJ SOLN
INTRAMUSCULAR | Status: DC | PRN
  Administered 2015-08-25: 25 ug via INTRAVENOUS

## 2015-08-25 NOTE — Progress Notes (Signed)
SUBJECTIVE:  No complaints  OBJECTIVE:   Vitals:   Filed Vitals:   08/25/15 0910 08/25/15 0920 08/25/15 1114 08/25/15 1158  BP: 127/66 117/71 116/59 106/70  Pulse: 93 98 86 87  Temp:    97.4 F (36.3 C)  TempSrc:    Oral  Resp: 17 22  20   Height:      Weight:      SpO2: 97% 96%  96%   I&O's:   Intake/Output Summary (Last 24 hours) at 08/25/15 1344 Last data filed at 08/25/15 1100  Gross per 24 hour  Intake    240 ml  Output   4050 ml  Net  -3810 ml   TELEMETRY: Reviewed telemetry pt in NSR:     PHYSICAL EXAM General: Well developed, well nourished, in no acute distress Head: Eyes PERRLA, No xanthomas.   Normal cephalic and atramatic  Lungs:   Clear bilaterally to auscultation and percussion. Heart:   HRRR S1 S2 Pulses are 2+ & equal. 2/6 late systolic mumur at LLSB to apex Abdomen: Bowel sounds are positive, abdomen soft and non-tender without masses Extremities:   No clubbing, cyanosis. DP +1. 1+ pedal edema of RLE Neuro: Alert and oriented X 3. Psych:  Good affect, responds appropriately   LABS: Basic Metabolic Panel:  Recent Labs  08/24/15 0512 08/24/15 1010 08/25/15 0400  NA 140  --  139  K 3.6  --  3.4*  CL 105  --  99*  CO2 28  --  28  GLUCOSE 98  --  92  BUN 20  --  16  CREATININE 0.85  --  0.92  CALCIUM 8.6*  --  8.6*  MG  --  2.2 2.2   Liver Function Tests:  Recent Labs  08/23/15 1810  AST 26  ALT 25  ALKPHOS 61  BILITOT 0.9  PROT 6.8  ALBUMIN 2.9*   No results for input(s): LIPASE, AMYLASE in the last 72 hours. CBC:  Recent Labs  08/23/15 1810 08/25/15 0400  WBC 6.2 7.2  NEUTROABS 4.5  --   HGB 10.6* 11.6*  HCT 33.1* 35.5*  MCV 94.0 94.4  PLT 321 342   Cardiac Enzymes:  Recent Labs  08/23/15 1810 08/23/15 2335 08/24/15 0512  TROPONINI <0.03 <0.03 <0.03   BNP: Invalid input(s): POCBNP D-Dimer: No results for input(s): DDIMER in the last 72 hours. Hemoglobin A1C: No results for input(s): HGBA1C in the last  72 hours. Fasting Lipid Panel: No results for input(s): CHOL, HDL, LDLCALC, TRIG, CHOLHDL, LDLDIRECT in the last 72 hours. Thyroid Function Tests: No results for input(s): TSH, T4TOTAL, T3FREE, THYROIDAB in the last 72 hours.  Invalid input(s): FREET3 Anemia Panel: No results for input(s): VITAMINB12, FOLATE, FERRITIN, TIBC, IRON, RETICCTPCT in the last 72 hours. Coag Panel:   Lab Results  Component Value Date   INR 1.34 08/23/2015    RADIOLOGY: Dg Chest Port 1 View  08/24/2015  CLINICAL DATA:  Acute and chronic CHF, mitral regurgitation, former smoker. EXAM: PORTABLE CHEST 1 VIEW COMPARISON:  Chest x-ray of February 5th 2017 FINDINGS: The lungs are well-expanded. There is new patchy alveolar opacity in the retrocardiac region on the right. There are coarse lung markings just lateral to the left heart border which are not greatly changed. The cardiac silhouette is enlarged. The pulmonary vascularity is not engorged. The mediastinum is normal in width. There is calcification within the wall of the descending thoracic aorta. The bony thorax is unremarkable. IMPRESSION: Cardiomegaly with mild central  pulmonary vascular congestion but no definite pulmonary edema. New right basilar atelectasis or pneumonia. Underlying COPD. Electronically Signed   By: David  Martinique M.D.   On: 08/24/2015 07:28    ASSESSMENT AND PLAN  1. Acute on chronic diastolic CHF. He has progressive volume overload. Weight up 14 lbs despite increased lasix dose. I suspect that his MR is probably the central problem. Held Pradaxa with plans for Right and left heart cath with coronary angiography on Friday 08/26/15. BNP of 590 - Diuresed negative 5 L yesterda with 15lb weight loss since admit. Renal function stable.  Continue IV lasix 40mg  BID.  - Continue BB and ARB. Scr back normal.   2.Mitra Regurgitation - Moderate to severe by recent Echo with prolapse of the anterior leaflet. - TEE revealed moderate MR due to anterior  leaflet prolapse of the A3-P3 interface and posteriorly directed eccentric jet, moderate to severe TR and severe biatrial enlargement.  There was normal LVF and moderate to severe atherosclerotic plaque in the ascending aorta.   - Plan for Rt and Lt heart cath on Friday  3. Chronic AFib  - HR controlled. Continue metoprolol. Pradaxa on hold for invasive procedures.  Currently on IV Heparin gtt  4. HTN - controlled  5. Pulmonary HTN- secondary to #1 and #2.   6. Bilateral leg cramp - Mg normal.  K of 3.4. . Replete potassium.  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  Unlikely to be DVT in setting of Pradaxa.    7. Anemia - Hgb of 11.6 today was 12.6 07/26/15. No blood in stool or melena. Daily CBC. Follow closely.     Jacob Margarita, MD  08/25/2015  1:44 PM

## 2015-08-25 NOTE — Progress Notes (Signed)
ANTICOAGULATION CONSULT NOTE  Pharmacy Consult for heparin Indication: atrial fibrillation  No Known Allergies  Patient Measurements: Height: 5\' 8"  (172.7 cm) Weight: 179 lb 3.2 oz (81.285 kg) (scale a) IBW/kg (Calculated) : 68.4 Heparin Dosing Weight: 85 kg  Vital Signs: Temp: 97.4 F (36.3 C) (03/09 1158) Temp Source: Oral (03/09 1158) BP: 106/70 mmHg (03/09 1158) Pulse Rate: 87 (03/09 1158)  Labs:  Recent Labs  08/23/15 1810 08/23/15 2335 08/24/15 0512 08/24/15 1946 08/25/15 0400 08/25/15 1513  HGB 10.6*  --   --   --  11.6*  --   HCT 33.1*  --   --   --  35.5*  --   PLT 321  --   --   --  342  --   APTT 41*  --   --   --   --   --   LABPROT 16.7*  --   --   --   --   --   INR 1.34  --   --   --   --   --   HEPARINUNFRC  --   --   --  <0.10* 0.28* 0.58  CREATININE 1.25*  --  0.85  --  0.92  --   TROPONINI <0.03 <0.03 <0.03  --   --   --     Estimated Creatinine Clearance: 66.1 mL/min (by C-G formula based on Cr of 0.92).  Assessment: 56 YOM on Pradaxa PTA for Afib - on hold for TEE on Thurs and R/L cath on Fri. Last pradaxa dose was 3/7 am. Heparin IV started for bridging on 3/8.  Heparin level this afternoon is therapeutic (HL 0.58 << 0.28, goal of 0.3-0.7). CBC stable from this AM - no overt s/sx of bleeding noted.   Goal of Therapy:  Heparin level 0.3-0.7 units/ml Monitor platelets by anticoagulation protocol: Yes   Plan:  1. Continue heparin at 1650 units/hr (16.5 ml/hr) 2. Will continue to monitor for any signs/symptoms of bleeding and will follow up with heparin level in 8 hours to confirm therapeutic  Alycia Rossetti, PharmD, BCPS Clinical Pharmacist Pager: [phone] 08/25/2015 4:05 PM

## 2015-08-25 NOTE — Consult Note (Signed)
   [facility] CM Inpatient Consult   08/25/2015  Jacob Shannon 03/01/1941 [id number] Screening was done to assess for care management services.    Met with the patient, and wife regarding the benefits of [facility] Care Management services.  Explained that [facility] is a covered benefit of Medicare insurance. Review information for [facility] Care Management and a folder was provided with contact information.  Explained that [facility] does not interfere with or replace any services arranged by the inpatient care management staff.  Patient declined services with [facility] for home visits. Patient has signed up for EMMI HF calls with inpatient RNCM.  A brochure with contact information was provided and encouraged them to call is he gets home and needs assistance or post hospital monitoring.  For questions please contact: Natividad Brood, RN BSN [facility] Liaison  [phone] business mobile phone Toll free office [phone]

## 2015-08-25 NOTE — Op Note (Signed)
INDICATIONS: mitral insufficiency  PROCEDURE:   Informed consent was obtained prior to the procedure. The risks, benefits and alternatives for the procedure were discussed and the patient comprehended these risks.  Risks include, but are not limited to, cough, sore throat, vomiting, nausea, somnolence, esophageal and stomach trauma or perforation, bleeding, low blood pressure, aspiration, pneumonia, infection, trauma to the teeth and death.    After a procedural time-out, the oropharynx was anesthetized with 20% benzocaine spray. The patient was given 3 mg versed and 25 mcg fentanyl for moderate sedation.   The transesophageal probe was inserted in the esophagus and stomach without difficulty and multiple views were obtained.  The patient was kept under observation until the patient left the procedure room.  The patient left the procedure room in stable condition.   Agitated microbubble saline contrast was not administered.  COMPLICATIONS:    There were no immediate complications.  FINDINGS:  Moderate MR due to anterior leaflet prolapse, primarily at A3-P3 interface, posteriorly directed eccentric jet. Moderate to severe TR. Severe biatrial enlargement. Normal LV size and function, EF>60%. Moderate to severe atherosclerotic plaque in the ascending aorta.  RECOMMENDATIONS:   Proceed with plans for coronary angio tomorrow, care to avoid aortic plaque dislodgement.  Time Spent Directly with the Patient:  30 minutes   Fitzgerald Dunne 08/25/2015, 8:15 AM

## 2015-08-25 NOTE — Progress Notes (Signed)
ANTICOAGULATION CONSULT NOTE  Pharmacy Consult for heparin Indication: atrial fibrillation  No Known Allergies  Patient Measurements: Height: 5\' 8"  (172.7 cm) Weight: 179 lb 3.2 oz (81.285 kg) (scale a) IBW/kg (Calculated) : 68.4 Heparin Dosing Weight: 85 kg  Vital Signs: Temp: 98.3 F (36.8 C) (03/09 0424) Temp Source: Oral (03/09 0424) BP: 148/77 mmHg (03/09 0424) Pulse Rate: 94 (03/09 0424)  Labs:  Recent Labs  08/23/15 1810 08/23/15 2335 08/24/15 0512 08/24/15 1946 08/25/15 0400  HGB 10.6*  --   --   --  11.6*  HCT 33.1*  --   --   --  35.5*  PLT 321  --   --   --  342  APTT 41*  --   --   --   --   LABPROT 16.7*  --   --   --   --   INR 1.34  --   --   --   --   HEPARINUNFRC  --   --   --  <0.10* 0.28*  CREATININE 1.25*  --  0.85  --   --   TROPONINI <0.03 <0.03 <0.03  --   --     Estimated Creatinine Clearance: 71.5 mL/min (by C-G formula based on Cr of 0.85).  Assessment: 81 YOM on Pradaxa PTA for Afib - on hold for TEE on Thurs and R/L cath on Fri. Last pradaxa dose was 3/7 am. On IV heparin for bridging. Heparin level slightly subtherapeutic on 1500 units/hr. No issues with line or bleeding reported per RN.  Goal of Therapy:  Heparin level 0.3-0.7 units/ml Monitor platelets by anticoagulation protocol: Yes   Plan:  Increase heparin infusion to 1650 units/hr F/u 8 hour heparin level F/u restarting pradaxa after procedures   Sherlon Handing, PharmD, BCPS Clinical pharmacist, pager [phone]  08/25/2015,5:50 AM

## 2015-08-25 NOTE — Care Management Note (Signed)
Case Management Note  Patient Details  Name: Jacob Shannon MRN: [id number] Date of Birth: Jan 03, 1941        Action/Plan: Patient lives at home with his spouse, remains active, continues to drive and walks for exercise. Patient states that he monitors his sodium and his wife cooks a healthy diet. No DME at this time. He has scales and weighs himself everyday. Patient is agreeable to the [facility] program for CHF, referral made.  Expected Discharge Date:    possibly 08/27/2015              Expected Discharge Plan:  Home/Self Care  Discharge planning Services  CM Consult  PChoice offered to:  NA    Status of Service:  In process, will continue to follow  [place] Bake RN.MHA,BSN [id number] 08/25/2015, 10:44 AM

## 2015-08-25 NOTE — Progress Notes (Signed)
  Echocardiogram 2D Echocardiogram has been performed.  Jacob Shannon M 08/25/2015, 9:02 AM

## 2015-08-25 NOTE — Progress Notes (Signed)
Pt potassium 3.4 this AM, received IV lasix this AM already.  Jacob Shannon with cardiology paged and time re-scheduled to give potassium now. Pt states he cannot take without full glass of water and states he prefers to take pills after TEE this AM. MD aware

## 2015-08-26 ENCOUNTER — Encounter (HOSPITAL_COMMUNITY)
Admission: AD | Disposition: A | Discharge status: Home / Self Care | Payer: Self-pay | Source: Ambulatory Visit | Attending: Cardiology

## 2015-08-26 ENCOUNTER — Encounter (HOSPITAL_COMMUNITY): Payer: Self-pay | Admitting: Cardiology

## 2015-08-26 DIAGNOSIS — I481 Persistent atrial fibrillation: Secondary | ICD-10-CM

## 2015-08-26 HISTORY — PX: CARDIAC CATHETERIZATION: SHX172

## 2015-08-26 LAB — CBC
HCT: 37.8 % — ABNORMAL LOW (ref 39.0–52.0)
Hemoglobin: 11.8 g/dL — ABNORMAL LOW (ref 13.0–17.0)
MCH: 29.5 pg (ref 26.0–34.0)
MCHC: 31.2 g/dL (ref 30.0–36.0)
MCV: 94.5 fL (ref 78.0–100.0)
PLATELETS: 333 10*3/uL (ref 150–400)
RBC: 4 MIL/uL — ABNORMAL LOW (ref 4.22–5.81)
RDW: 15.8 % — AB (ref 11.5–15.5)
WBC: 5.7 10*3/uL (ref 4.0–10.5)

## 2015-08-26 LAB — POCT I-STAT 3, ART BLOOD GAS (G3+)
Acid-Base Excess: 7 mmol/L — ABNORMAL HIGH (ref 0.0–2.0)
Bicarbonate: 31.1 mEq/L — ABNORMAL HIGH (ref 20.0–24.0)
O2 Saturation: 94 %
PCO2 ART: 41.5 mmHg (ref 35.0–45.0)
PO2 ART: 66 mmHg — AB (ref 80.0–100.0)
TCO2: 32 mmol/L (ref 0–100)
pH, Arterial: 7.482 — ABNORMAL HIGH (ref 7.350–7.450)

## 2015-08-26 LAB — BASIC METABOLIC PANEL
ANION GAP: 9 (ref 5–15)
BUN: 17 mg/dL (ref 6–20)
CALCIUM: 8.6 mg/dL — AB (ref 8.9–10.3)
CO2: 28 mmol/L (ref 22–32)
CREATININE: 0.95 mg/dL (ref 0.61–1.24)
Chloride: 101 mmol/L (ref 101–111)
GFR calc Af Amer: >60 mL/min (ref >60)
GLUCOSE: 97 mg/dL (ref 65–99)
Potassium: 3.5 mmol/L (ref 3.5–5.1)
Sodium: 138 mmol/L (ref 135–145)

## 2015-08-26 LAB — POCT I-STAT 3, VENOUS BLOOD GAS (G3P V)
Acid-Base Excess: 6 mmol/L — ABNORMAL HIGH (ref 0.0–2.0)
BICARBONATE: 31.3 meq/L — AB (ref 20.0–24.0)
O2 Saturation: 55 %
PH VEN: 7.442 — AB (ref 7.250–7.300)
PO2 VEN: 28 mmHg — AB (ref 31.0–45.0)
TCO2: 33 mmol/L (ref 0–100)
pCO2, Ven: 45.9 mmHg (ref 45.0–50.0)

## 2015-08-26 LAB — HEPARIN LEVEL (UNFRACTIONATED)
HEPARIN UNFRACTIONATED: 0.47 [IU]/mL (ref 0.30–0.70)
HEPARIN UNFRACTIONATED: 0.54 [IU]/mL (ref 0.30–0.70)

## 2015-08-26 LAB — POCT ACTIVATED CLOTTING TIME
Activated Clotting Time: 193 seconds
Activated Clotting Time: 178 s

## 2015-08-26 LAB — MAGNESIUM: MAGNESIUM: 2.3 mg/dL (ref 1.7–2.4)

## 2015-08-26 SURGERY — RIGHT/LEFT HEART CATH AND CORONARY ANGIOGRAPHY
Anesthesia: LOCAL

## 2015-08-26 MED ORDER — LIDOCAINE HCL (PF) 1 % IJ SOLN
INTRAMUSCULAR | Status: DC | PRN
  Administered 2015-08-26: 10 mL
  Administered 2015-08-26: 18 mL

## 2015-08-26 MED ORDER — FENTANYL CITRATE (PF) 100 MCG/2ML IJ SOLN
INTRAMUSCULAR | Status: DC | PRN
  Administered 2015-08-26 (×2): 25 ug via INTRAVENOUS

## 2015-08-26 MED ORDER — SODIUM CHLORIDE 0.9 % WEIGHT BASED INFUSION: 1.0000 mL/kg/h | INTRAVENOUS | Status: AC

## 2015-08-26 MED ORDER — SODIUM CHLORIDE 0.9 % IV SOLN: 250.0000 mL | INTRAVENOUS | Status: DC | PRN

## 2015-08-26 MED ORDER — MIDAZOLAM HCL 2 MG/2ML IJ SOLN
INTRAMUSCULAR | Status: AC
  Filled 2015-08-26: qty 2

## 2015-08-26 MED ORDER — SODIUM CHLORIDE 0.9% FLUSH
3.0000 mL | Freq: Two times a day (BID) | INTRAVENOUS | Status: DC
  Administered 2015-08-27: 3 mL via INTRAVENOUS

## 2015-08-26 MED ORDER — FUROSEMIDE 40 MG PO TABS
40.0000 mg | ORAL_TABLET | Freq: Two times a day (BID) | ORAL | Status: DC
  Administered 2015-08-26 – 2015-08-27 (×2): 40 mg via ORAL
  Filled 2015-08-26 (×2): qty 1

## 2015-08-26 MED ORDER — HEPARIN (PORCINE) IN NACL 2-0.9 UNIT/ML-% IJ SOLN
INTRAMUSCULAR | Status: AC
  Filled 2015-08-26: qty 1500

## 2015-08-26 MED ORDER — LIDOCAINE HCL (PF) 1 % IJ SOLN
INTRAMUSCULAR | Status: AC
  Filled 2015-08-26: qty 30

## 2015-08-26 MED ORDER — VERAPAMIL HCL 2.5 MG/ML IV SOLN
INTRAVENOUS | Status: AC
  Filled 2015-08-26: qty 2

## 2015-08-26 MED ORDER — GUAIFENESIN-CODEINE 100-10 MG/5ML PO SOLN
5.0000 mL | Freq: Once | ORAL | Status: AC
  Administered 2015-08-26: 5 mL via ORAL

## 2015-08-26 MED ORDER — HEPARIN SODIUM (PORCINE) 1000 UNIT/ML IJ SOLN
INTRAMUSCULAR | Status: AC
  Filled 2015-08-26: qty 1

## 2015-08-26 MED ORDER — SODIUM CHLORIDE 0.9% FLUSH: 3.0000 mL | INTRAVENOUS | Status: DC | PRN

## 2015-08-26 MED ORDER — IOHEXOL 350 MG/ML SOLN
INTRAVENOUS | Status: DC | PRN
  Administered 2015-08-26: 70 mL via INTRA_ARTERIAL

## 2015-08-26 MED ORDER — HEPARIN (PORCINE) IN NACL 2-0.9 UNIT/ML-% IJ SOLN
INTRAMUSCULAR | Status: DC | PRN
  Administered 2015-08-26: 1000 mL

## 2015-08-26 MED ORDER — METOPROLOL SUCCINATE ER 50 MG PO TB24
75.0000 mg | ORAL_TABLET | Freq: Every day | ORAL | Status: DC
  Administered 2015-08-27: 75 mg via ORAL
  Filled 2015-08-26: qty 1

## 2015-08-26 MED ORDER — HEPARIN SODIUM (PORCINE) 1000 UNIT/ML IJ SOLN
INTRAMUSCULAR | Status: DC | PRN
  Administered 2015-08-26: 4000 [IU] via INTRAVENOUS

## 2015-08-26 MED ORDER — FENTANYL CITRATE (PF) 100 MCG/2ML IJ SOLN
INTRAMUSCULAR | Status: AC
  Filled 2015-08-26: qty 2

## 2015-08-26 MED ORDER — VERAPAMIL HCL 2.5 MG/ML IV SOLN
INTRAVENOUS | Status: DC | PRN
  Administered 2015-08-26: 10 mL via INTRA_ARTERIAL

## 2015-08-26 MED ORDER — MIDAZOLAM HCL 2 MG/2ML IJ SOLN
INTRAMUSCULAR | Status: DC | PRN
  Administered 2015-08-26 (×2): 1 mg via INTRAVENOUS

## 2015-08-26 MED ORDER — HEPARIN (PORCINE) IN NACL 2-0.9 UNIT/ML-% IJ SOLN
INTRAMUSCULAR | Status: AC
  Filled 2015-08-26: qty 1000

## 2015-08-26 MED ORDER — GUAIFENESIN-CODEINE 100-10 MG/5ML PO SOLN
ORAL | Status: AC
  Filled 2015-08-26: qty 5

## 2015-08-26 SURGICAL SUPPLY — 19 items
CATH INFINITI 4FR JL3.5 (CATHETERS) ×2 IMPLANT
CATH INFINITI 5FR ANG PIGTAIL (CATHETERS) ×2 IMPLANT
CATH INFINITI 5FR JL4 (CATHETERS) ×2 IMPLANT
CATH INFINITI JR4 5F (CATHETERS) ×2 IMPLANT
CATH LAUNCHER 5F RADL (CATHETERS) ×1 IMPLANT
CATH SWAN GANZ 7F STRAIGHT (CATHETERS) ×2 IMPLANT
CATHETER LAUNCHER 5F RADL (CATHETERS) ×2
DEVICE RAD COMP TR BAND LRG (VASCULAR PRODUCTS) ×2 IMPLANT
GLIDESHEATH SLEND SS 6F .021 (SHEATH) ×2 IMPLANT
KIT HEART LEFT (KITS) ×2 IMPLANT
PACK CARDIAC CATHETERIZATION (CUSTOM PROCEDURE TRAY) ×2 IMPLANT
SHEATH PINNACLE 5F 10CM (SHEATH) ×2 IMPLANT
SHEATH PINNACLE 7F 10CM (SHEATH) ×2 IMPLANT
SYR MEDRAD MARK V 150ML (SYRINGE) ×2 IMPLANT
TRANSDUCER W/STOPCOCK (MISCELLANEOUS) ×2 IMPLANT
TUBING CIL FLEX 10 FLL-RA (TUBING) ×2 IMPLANT
WIRE EMERALD 3MM-J .025X260CM (WIRE) ×2 IMPLANT
WIRE HI TORQ VERSACORE-J 145CM (WIRE) ×2 IMPLANT
WIRE SAFE-T 1.5MM-J .035X260CM (WIRE) ×2 IMPLANT

## 2015-08-26 NOTE — Progress Notes (Signed)
Patient Name: Jacob Shannon Date of Encounter: 08/26/2015   SUBJECTIVE  No chest pain or sob. Complains for chest congestion and dry cough.   CURRENT MEDS . amLODipine  5 mg Oral Daily  . furosemide  40 mg Intravenous Q12H  . losartan  100 mg Oral Daily  . metoprolol succinate  50 mg Oral Daily  . sodium chloride flush  3 mL Intravenous Q12H    OBJECTIVE  Filed Vitals:   08/25/15 1158 08/25/15 1657 08/25/15 2000 08/26/15 0511  BP: 106/70 139/85 129/73 152/84  Pulse: 87 86  91  Temp: 97.4 F (36.3 C)  97.6 F (36.4 C) 97.9 F (36.6 C)  TempSrc: Oral  Oral Oral  Resp: 20  16 16   Height:      Weight:    175 lb 14.4 oz (79.788 kg)  SpO2: 96%  96% 98%    Intake/Output Summary (Last 24 hours) at 08/26/15 0950 Last data filed at 08/26/15 0806  Gross per 24 hour  Intake    966 ml  Output   3050 ml  Net  -2084 ml   Filed Weights   08/24/15 0533 08/25/15 0424 08/26/15 0511  Weight: 188 lb 3.2 oz (85.367 kg) 179 lb 3.2 oz (81.285 kg) 175 lb 14.4 oz (79.788 kg)    PHYSICAL EXAM  General: Pleasant, NAD. Neuro: Alert and oriented X 3. Moves all extremities spontaneously. Psych: Normal affect. HEENT:  Normal  Neck: Supple without bruits or JVD. Lungs:  Resp regular and unlabored, CTA. Heart: IR Ir no s3, s4. 2/6 late systolic mumur at LLSB to apex Abdomen: Soft, non-tender, non-distended, BS + x 4.  Extremities: No clubbing, cyanosis. 1+ pedal edema of RLE. DP 1+ and equal bilaterally.  Accessory Clinical Findings  CBC  Recent Labs  08/23/15 1810 08/25/15 0400 08/26/15 0430  WBC 6.2 7.2 5.7  NEUTROABS 4.5  --   --   HGB 10.6* 11.6* 11.8*  HCT 33.1* 35.5* 37.8*  MCV 94.0 94.4 94.5  PLT 321 342 0000000   Basic Metabolic Panel  Recent Labs  08/24/15 1010 08/25/15 0400 08/26/15 0430  NA  --  139 138  K  --  3.4* 3.5  CL  --  99* 101  CO2  --  28 28  GLUCOSE  --  92 97  BUN  --  16 17  CREATININE  --  0.92 0.95  CALCIUM  --  8.6* 8.6*  MG 2.2 2.2   --    Liver Function Tests  Recent Labs  08/23/15 1810  AST 26  ALT 25  ALKPHOS 61  BILITOT 0.9  PROT 6.8  ALBUMIN 2.9*   No results for input(s): LIPASE, AMYLASE in the last 72 hours. Cardiac Enzymes  Recent Labs  08/23/15 1810 08/23/15 2335 08/24/15 0512  TROPONINI <0.03 <0.03 <0.03    TELE  Afib at rate mostly of 80-90s. Went to 120s this morning. 12 beats of NSVT x 1.   Radiology/Studies  Dg Chest Port 1 View  08/24/2015  CLINICAL DATA:  Acute and chronic CHF, mitral regurgitation, former smoker. EXAM: PORTABLE CHEST 1 VIEW COMPARISON:  Chest x-ray of February 5th 2017 FINDINGS: The lungs are well-expanded. There is new patchy alveolar opacity in the retrocardiac region on the right. There are coarse lung markings just lateral to the left heart border which are not greatly changed. The cardiac silhouette is enlarged. The pulmonary vascularity is not engorged. The mediastinum is normal in width. There is calcification  within the wall of the descending thoracic aorta. The bony thorax is unremarkable. IMPRESSION: Cardiomegaly with mild central pulmonary vascular congestion but no definite pulmonary edema. New right basilar atelectasis or pneumonia. Underlying COPD. Electronically Signed   By: David  Martinique M.D.   On: 08/24/2015 07:28    ASSESSMENT AND PLAN  1. Acute on chronic diastolic CHF. -  He has progressive volume overload. Weight up 14 lbs despite increased lasix dose. Suspects that his MR is probably the central problem. Held Pradaxa with plans for Right and left heart cath with coronary angiography on Friday 08/26/15. BNP of 590 - Diuresed negative 1.1L/6.9L with 18lb weight loss since admit. Renal function stable. Continue IV lasix 40mg  BID today. Likely change to po lasix tomorrow. Gentle hydration of cath.  - Continue BB and ARB.    2.Mitra Regurgitation. -  Moderate to severe by recent Echo with prolapse of the anterior leaflet. - TEE revealed moderate MR  due to anterior leaflet prolapse of the A3-P3 interface and posteriorly directed eccentric jet, moderate to severe TR and severe biatrial enlargement. There was normal LVF and moderate to severe atherosclerotic plaque in the ascending aorta.  - Plan for Rt and Lt heart cath today  3. Chronic AFib  - HR controlled. However elevated to 120s this morning.  Continue metoprolol. Pradaxa on hold for invasive procedures. Currently on IV Heparin gtt. Resume pradaxa post cath.   4. HTN - controlled  5. Pulmonary HTN- secondary to #1 and #2.   6. Bilateral leg cramp - Mg normal. K of 3.4. . Repleted potassium. 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 Unlikely to be DVT in setting of Pradaxa.   7. Anemia - stable Daily CBC. Follow closely.   8. NSVT - 12 beats this morning with elevated rate up to 120s. K normal. Will get Mg today.  Follow closely on monitor. Continue BB, consider increasing dose.   9. Chest congestion - The patient complains of upper chest congestion with dry cough. Consider given Mucinex. No fever. Lung clear with scattered course breath sound.    Jarrett Soho PA-C Pager [phone]

## 2015-08-26 NOTE — H&P (View-Only) (Signed)
SUBJECTIVE:  No complaints  OBJECTIVE:   Vitals:   Filed Vitals:   08/25/15 0910 08/25/15 0920 08/25/15 1114 08/25/15 1158  BP: 127/66 117/71 116/59 106/70  Pulse: 93 98 86 87  Temp:    97.4 F (36.3 C)  TempSrc:    Oral  Resp: 17 22  20   Height:      Weight:      SpO2: 97% 96%  96%   I&O's:   Intake/Output Summary (Last 24 hours) at 08/25/15 1344 Last data filed at 08/25/15 1100  Gross per 24 hour  Intake    240 ml  Output   4050 ml  Net  -3810 ml   TELEMETRY: Reviewed telemetry pt in NSR:     PHYSICAL EXAM General: Well developed, well nourished, in no acute distress Head: Eyes PERRLA, No xanthomas.   Normal cephalic and atramatic  Lungs:   Clear bilaterally to auscultation and percussion. Heart:   HRRR S1 S2 Pulses are 2+ & equal. 2/6 late systolic mumur at LLSB to apex Abdomen: Bowel sounds are positive, abdomen soft and non-tender without masses Extremities:   No clubbing, cyanosis. DP +1. 1+ pedal edema of RLE Neuro: Alert and oriented X 3. Psych:  Good affect, responds appropriately   LABS: Basic Metabolic Panel:  Recent Labs  08/24/15 0512 08/24/15 1010 08/25/15 0400  NA 140  --  139  K 3.6  --  3.4*  CL 105  --  99*  CO2 28  --  28  GLUCOSE 98  --  92  BUN 20  --  16  CREATININE 0.85  --  0.92  CALCIUM 8.6*  --  8.6*  MG  --  2.2 2.2   Liver Function Tests:  Recent Labs  08/23/15 1810  AST 26  ALT 25  ALKPHOS 61  BILITOT 0.9  PROT 6.8  ALBUMIN 2.9*   No results for input(s): LIPASE, AMYLASE in the last 72 hours. CBC:  Recent Labs  08/23/15 1810 08/25/15 0400  WBC 6.2 7.2  NEUTROABS 4.5  --   HGB 10.6* 11.6*  HCT 33.1* 35.5*  MCV 94.0 94.4  PLT 321 342   Cardiac Enzymes:  Recent Labs  08/23/15 1810 08/23/15 2335 08/24/15 0512  TROPONINI <0.03 <0.03 <0.03   BNP: Invalid input(s): POCBNP D-Dimer: No results for input(s): DDIMER in the last 72 hours. Hemoglobin A1C: No results for input(s): HGBA1C in the last  72 hours. Fasting Lipid Panel: No results for input(s): CHOL, HDL, LDLCALC, TRIG, CHOLHDL, LDLDIRECT in the last 72 hours. Thyroid Function Tests: No results for input(s): TSH, T4TOTAL, T3FREE, THYROIDAB in the last 72 hours.  Invalid input(s): FREET3 Anemia Panel: No results for input(s): VITAMINB12, FOLATE, FERRITIN, TIBC, IRON, RETICCTPCT in the last 72 hours. Coag Panel:   Lab Results  Component Value Date   INR 1.34 08/23/2015    RADIOLOGY: Dg Chest Port 1 View  08/24/2015  CLINICAL DATA:  Acute and chronic CHF, mitral regurgitation, former smoker. EXAM: PORTABLE CHEST 1 VIEW COMPARISON:  Chest x-ray of February 5th 2017 FINDINGS: The lungs are well-expanded. There is new patchy alveolar opacity in the retrocardiac region on the right. There are coarse lung markings just lateral to the left heart border which are not greatly changed. The cardiac silhouette is enlarged. The pulmonary vascularity is not engorged. The mediastinum is normal in width. There is calcification within the wall of the descending thoracic aorta. The bony thorax is unremarkable. IMPRESSION: Cardiomegaly with mild central  pulmonary vascular congestion but no definite pulmonary edema. New right basilar atelectasis or pneumonia. Underlying COPD. Electronically Signed   By: David  Martinique M.D.   On: 08/24/2015 07:28    ASSESSMENT AND PLAN  1. Acute on chronic diastolic CHF. He has progressive volume overload. Weight up 14 lbs despite increased lasix dose. I suspect that his MR is probably the central problem. Held Pradaxa with plans for Right and left heart cath with coronary angiography on Friday 08/26/15. BNP of 590 - Diuresed negative 5 L yesterda with 15lb weight loss since admit. Renal function stable.  Continue IV lasix 40mg  BID.  - Continue BB and ARB. Scr back normal.   2.Mitra Regurgitation - Moderate to severe by recent Echo with prolapse of the anterior leaflet. - TEE revealed moderate MR due to anterior  leaflet prolapse of the A3-P3 interface and posteriorly directed eccentric jet, moderate to severe TR and severe biatrial enlargement.  There was normal LVF and moderate to severe atherosclerotic plaque in the ascending aorta.   - Plan for Rt and Lt heart cath on Friday  3. Chronic AFib  - HR controlled. Continue metoprolol. Pradaxa on hold for invasive procedures.  Currently on IV Heparin gtt  4. HTN - controlled  5. Pulmonary HTN- secondary to #1 and #2.   6. Bilateral leg cramp - Mg normal.  K of 3.4. . Replete potassium.  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  Unlikely to be DVT in setting of Pradaxa.    7. Anemia - Hgb of 11.6 today was 12.6 07/26/15. No blood in stool or melena. Daily CBC. Follow closely.     Sueanne Margarita, MD  08/25/2015  1:44 PM

## 2015-08-26 NOTE — Progress Notes (Signed)
ANTICOAGULATION CONSULT NOTE  Pharmacy Consult for heparin Indication: atrial fibrillation  No Known Allergies  Patient Measurements: Height: 5\' 8"  (172.7 cm) Weight: 179 lb 3.2 oz (81.285 kg) (scale a) IBW/kg (Calculated) : 68.4 Heparin Dosing Weight: 85 kg  Vital Signs: Temp: 97.6 F (36.4 C) (03/09 2000) Temp Source: Oral (03/09 2000) BP: 129/73 mmHg (03/09 2000) Pulse Rate: 86 (03/09 1657)  Labs:  Recent Labs  08/23/15 1810 08/23/15 2335 08/24/15 0512  08/25/15 0400 08/25/15 1513 08/26/15 0010  HGB 10.6*  --   --   --  11.6*  --   --   HCT 33.1*  --   --   --  35.5*  --   --   PLT 321  --   --   --  342  --   --   APTT 41*  --   --   --   --   --   --   LABPROT 16.7*  --   --   --   --   --   --   INR 1.34  --   --   --   --   --   --   HEPARINUNFRC  --   --   --   < > 0.28* 0.58 0.47  CREATININE 1.25*  --  0.85  --  0.92  --   --   TROPONINI <0.03 <0.03 <0.03  --   --   --   --   < > = values in this interval not displayed.  Estimated Creatinine Clearance: 66.1 mL/min (by C-G formula based on Cr of 0.92).  Assessment: 28 YOM on Pradaxa PTA for Afib - on hold for TEE on Thurs and R/L cath on Fri. Last pradaxa dose was 3/7 am. Heparin IV started for bridging on 3/8. Heparin level remains therapeutic. No bleeding noted.  Goal of Therapy:  Heparin level 0.3-0.7 units/ml Monitor platelets by anticoagulation protocol: Yes   Plan:  1. Continue heparin at 1650 units/hr (16.5 ml/hr) 2. F/u daily heparin level and CBC  Sherlon Handing, PharmD, BCPS Clinical pharmacist, pager [phone] 08/26/2015 1:10 AM

## 2015-08-26 NOTE — Progress Notes (Signed)
Site area: Right groin a 5 french and a 7 french venous sheath ws removed  Site Prior to Removal:  Level 0  Pressure Applied For 20 MINUTES    Minutes Beginning at 1335p  Manual:   Yes.    Patient Status During Pull:  stable  Post Pull Groin Site:  Level 0  Post Pull Instructions Given:  Yes.    Post Pull Pulses Present:  Yes.    Dressing Applied:  Yes.    Comments:  VS remain stable .  Pt able to verbalize understanding of post instructions

## 2015-08-26 NOTE — Progress Notes (Signed)
Pt scheduled for Cath on 08/26/15 at 10:30. No orders available at this time. Pt NPO past midnight, labs/EKG within date, and 2 IV's had been inserted per usual pre-cath protocol. Pt to have both groins clipped in AM. Consent to be signed once order written. Will continue to monitor.

## 2015-08-26 NOTE — Care Management Important Message (Signed)
Important Message  Patient Details  Name: Jacob Shannon MRN: [id number] Date of Birth: [birth year]   Medicare Important Message Given:  Yes    Barb Merino Fallis 08/26/2015, 12:54 PM

## 2015-08-26 NOTE — Interval H&P Note (Signed)
History and Physical Interval Note:  08/26/2015 11:14 AM  Jacob Shannon  has presented today for surgery, with the diagnosis of heart failure    mr  The various methods of treatment have been discussed with the patient and family. After consideration of risks, benefits and other options for treatment, the patient has consented to  Procedure(s): Right/Left Heart Cath and Coronary Angiography (N/A) as a surgical intervention .  The patient's history has been reviewed, patient examined, no change in status, stable for surgery.  I have reviewed the patient's chart and labs.  Questions were answered to the patient's satisfaction.   Cath Lab Visit (complete for each Cath Lab visit)  Clinical Evaluation Leading to the Procedure:   ACS: No.  Non-ACS:    Anginal Classification: CCS III  Anti-ischemic medical therapy: Maximal Therapy (2 or more classes of medications)  Non-Invasive Test Results: No non-invasive testing performed  Prior CABG: No previous CABG       [name] [facility] 08/26/2015 11:14 AM

## 2015-08-26 NOTE — Progress Notes (Signed)
Utilization review completed. Kaushik Maul, RN, BSN. 

## 2015-08-27 LAB — CBC
HCT: 43.1 % (ref 39.0–52.0)
HEMOGLOBIN: 13.4 g/dL (ref 13.0–17.0)
MCH: 29.6 pg (ref 26.0–34.0)
MCHC: 31.1 g/dL (ref 30.0–36.0)
MCV: 95.4 fL (ref 78.0–100.0)
PLATELETS: 316 10*3/uL (ref 150–400)
RBC: 4.52 MIL/uL (ref 4.22–5.81)
RDW: 15.8 % — ABNORMAL HIGH (ref 11.5–15.5)
WBC: 6.1 10*3/uL (ref 4.0–10.5)

## 2015-08-27 LAB — BASIC METABOLIC PANEL
Anion gap: 13 (ref 5–15)
BUN: 20 mg/dL (ref 6–20)
CO2: 26 mmol/L (ref 22–32)
CREATININE: 0.91 mg/dL (ref 0.61–1.24)
Calcium: 8.8 mg/dL — ABNORMAL LOW (ref 8.9–10.3)
Chloride: 101 mmol/L (ref 101–111)
Glucose, Bld: 79 mg/dL (ref 65–99)
POTASSIUM: 4.5 mmol/L (ref 3.5–5.1)
SODIUM: 140 mmol/L (ref 135–145)

## 2015-08-27 MED ORDER — TORSEMIDE 20 MG PO TABS: 20.0000 mg | ORAL_TABLET | Freq: Two times a day (BID) | ORAL | Status: DC

## 2015-08-27 MED ORDER — POTASSIUM CHLORIDE CRYS ER 20 MEQ PO TBCR
20.0000 meq | EXTENDED_RELEASE_TABLET | Freq: Two times a day (BID) | ORAL | Status: DC
  Administered 2015-08-27: 20 meq via ORAL
  Filled 2015-08-27: qty 1

## 2015-08-27 MED ORDER — METOPROLOL SUCCINATE ER 25 MG PO TB24: 75.0000 mg | ORAL_TABLET | Freq: Every day | ORAL | Status: DC

## 2015-08-27 MED ORDER — GUAIFENESIN 100 MG/5ML PO SYRP
200.0000 mg | ORAL_SOLUTION | Freq: Four times a day (QID) | ORAL | Status: DC | PRN
  Filled 2015-08-27: qty 10

## 2015-08-27 MED ORDER — DABIGATRAN ETEXILATE MESYLATE 150 MG PO CAPS
150.0000 mg | ORAL_CAPSULE | Freq: Two times a day (BID) | ORAL | Status: DC
  Administered 2015-08-27: 150 mg via ORAL
  Filled 2015-08-27: qty 1

## 2015-08-27 MED ORDER — GUAIFENESIN 100 MG/5ML PO SYRP
200.0000 mg | ORAL_SOLUTION | Freq: Four times a day (QID) | ORAL | Status: DC | PRN
  Administered 2015-08-27: 200 mg via ORAL
  Filled 2015-08-27 (×3): qty 10

## 2015-08-27 MED ORDER — POTASSIUM CHLORIDE CRYS ER 20 MEQ PO TBCR: 20.0000 meq | EXTENDED_RELEASE_TABLET | Freq: Two times a day (BID) | ORAL | Status: DC

## 2015-08-27 NOTE — Discharge Summary (Signed)
Discharge Summary    Patient ID: Jacob Shannon,  MRN: [id number], DOB/AGE: 1940-10-29 75 y.o.  Admit date: 08/23/2015 Discharge date: 08/27/2015  Primary Care Provider: Cyndi Bender Primary Cardiologist: Dr Martinique  Discharge Diagnoses    Principal Problem:   Acute on chronic combined systolic and diastolic CHF, NYHA class 4 ([place]) Active Problems:   Pulmonary hypertension ([place])   Essential hypertension   Mitral regurgitation   Atrial fibrillation ([facility])   Mitral valve prolapse   Acute on chronic combined systolic (congestive) and diastolic (congestive) heart failure ([facility])   Allergies No Known Allergies  Diagnostic Studies/Procedures    TEE, 08/25/2015 - Left ventricle: Systolic function was normal. The estimated  ejection fraction was in the range of 55% to 60%. The study is  not technically sufficient to allow evaluation of LV diastolic function. - Aortic valve: No evidence of vegetation. There was mild regurgitation. - Mitral valve: There was moderate (at most moderate-to-severe)  regurgitation directed eccentrically and posteriorly. - Left atrium: The atrium was severely dilated. No evidence of  thrombus in the atrial cavity or appendage. No spontaneous echo  contrast was observed. - Right atrium: The atrium was severely dilated. No evidence of  thrombus in the atrial cavity or appendage. - Atrial septum: No defect or patent foramen ovale was identified.  Echo contrast study showed no right-to-left atrial level shunt,  following an increase in RA pressure induced by provocative maneuvers. - Tricuspid valve: No evidence of vegetation. No evidence of  vegetation. There was moderate regurgitation directed centrally. - Pulmonary arteries: PA peak pressure: 50 mm Hg (S). Impressions: - The severity of mitral insuficiency appeared dependent on the  severity of the elevation in systolic BP (very high at study  onset, normal by the time of study completion).  The eccentric jet  and the presence of severe left atrial dilation may lead to  underestimation of MR severity. In aggregate, however, the mitral  regurgitation does not appear to be severe.  Cardiac Cath: 08/26/2015  Prox LAD lesion, 25% stenosed. 1. Minor nonobstructive CAD 2. Normal LV function. 3. Moderate mitral insufficiency. 4. Normal right heart pressures. 5. Normal LV filling pressures.  Plan: medical management. MR does not appear severe enough to warrant repair. Will switch lasix to po. May resume Pradaxa in am. Anticipate DC tomorrow. _____________   History of Present Illness     75 y.o. male with a hx of chronic atrial fibrillation, HTN, HL. He was found to have moderate-severe MR with prolapse of anterior mitral valve leaflet by echocardiogram 07/2015. Medical therapy was recommended. He developed increasing dyspnea on exertion, and his weight was about 14 pounds. He came to the ER and was admitted for further evaluation and treatment.  Hospital Course     Consultants: [facility]   His cardiac enzymes were negative for MI. He was diuresed with IV Lasix and his respiratory status improved. Once he improved, he was scheduled for a TEE to assess his mitral valve for the degree of MR. Results are above, MR was not severe enough to warrant repair. After that, a right and left heart catheterization was performed to assess him for CAD and right heart pressures. Results are above. He had minor CAD and normal right heart pressures after diuresis. His Lasix was changed from IV to oral and that was changed to [facility] for better diuresis. Potassium was supplemented as well.  His atrial fibrillation was controlled with a beta blocker. However, in order to allow for  diuresis and medications were start rate, his metoprolol dose was decreased. He was a bit anemic on admission, but this improved with diuresis. It was possibly dilutional.  Internal medicine follow-up was arranged by [facility].  On  08/27/2015, he was seen by Dr. Acie Fredrickson and all data were reviewed. He had a dry cough but was otherwise much improved. Has blood pressure was on the low side, but now that his diuretic is changed to by mouth, he should stabilize. No further inpatient workup is indicated and he is considered stable for discharge, to follow up as an outpatient with a TCM appointment.  _____________  Discharge Vitals Blood pressure 94/45, pulse 89, temperature 97.9 F (36.6 C), temperature source Oral, resp. rate 20, height 5\' 8"  (1.727 m), weight 173 lb 12.8 oz (78.835 kg), SpO2 99 %.  Filed Weights   08/25/15 0424 08/26/15 0511 08/27/15 0539  Weight: 179 lb 3.2 oz (81.285 kg) 175 lb 14.4 oz (79.788 kg) 173 lb 12.8 oz (78.835 kg)    Labs & Radiologic Studies     CBC  Recent Labs  08/26/15 0430 08/27/15 0406  WBC 5.7 6.1  HGB 11.8* 13.4  HCT 37.8* 43.1  MCV 94.5 95.4  PLT 333 123XX123   Basic Metabolic Panel  Recent Labs  08/25/15 0400 08/26/15 0430 08/26/15 1030 08/27/15 0406  NA 139 138  --  140  K 3.4* 3.5  --  4.5  CL 99* 101  --  101  CO2 28 28  --  26  GLUCOSE 92 97  --  79  BUN 16 17  --  20  CREATININE 0.92 0.95  --  0.91  CALCIUM 8.6* 8.6*  --  8.8*  MG 2.2  --  2.3  --    Lab Results  Component Value Date   TROPONINI <0.03 08/24/2015     Dg Chest Port 1 View  08/24/2015  CLINICAL DATA:  Acute and chronic CHF, mitral regurgitation, former smoker. EXAM: PORTABLE CHEST 1 VIEW COMPARISON:  Chest x-ray of February 5th 2017 FINDINGS: The lungs are well-expanded. There is new patchy alveolar opacity in the retrocardiac region on the right. There are coarse lung markings just lateral to the left heart border which are not greatly changed. The cardiac silhouette is enlarged. The pulmonary vascularity is not engorged. The mediastinum is normal in width. There is calcification within the wall of the descending thoracic aorta. The bony thorax is unremarkable. IMPRESSION: Cardiomegaly with mild  central pulmonary vascular congestion but no definite pulmonary edema. New right basilar atelectasis or pneumonia. Underlying COPD. Electronically Signed   By: David  Martinique M.D.   On: 08/24/2015 07:28    Disposition   Pt is being discharged home today in good condition.  Follow-up Plans & Appointments    Follow-up Information    Follow up with Peter Martinique, MD.   Specialty:  Cardiology   Why:  The office will call.   Contact information:   [place] [address] [phone]      Discharge Instructions    (HEART FAILURE PATIENTS) Call MD:  Anytime you have any of the following symptoms: 1) 3 pound weight gain in 24 hours or 5 pounds in 1 week 2) shortness of breath, with or without a dry hacking cough 3) swelling in the hands, feet or stomach 4) if you have to sleep on extra pillows at night in order to breathe.    Complete by:  As directed  Diet - low sodium heart healthy    Complete by:  As directed      Increase activity slowly    Complete by:  As directed            Discharge Medications   Current Discharge Medication List    START taking these medications   Details  torsemide (DEMADEX) 20 MG tablet Take 1 tablet (20 mg total) by mouth 2 (two) times daily. Qty: 60 tablet, Refills: 11      CONTINUE these medications which have CHANGED   Details  metoprolol succinate (TOPROL-XL) 25 MG 24 hr tablet Take 3 tablets (75 mg total) by mouth daily. Qty: 90 tablet, Refills: 11    potassium chloride SA (K-DUR,KLOR-CON) 20 MEQ tablet Take 1 tablet (20 mEq total) by mouth 2 (two) times daily. Qty: 60 tablet, Refills: 11      CONTINUE these medications which have NOT CHANGED   Details  amLODipine (NORVASC) 5 MG tablet Take 5 mg by mouth daily.     colchicine 0.6 MG tablet Take 0.6 mg by mouth daily as needed (gout attack).     dabigatran (PRADAXA) 150 MG CAPS capsule Take 150 mg by mouth 2 (two) times daily.    fluticasone (FLONASE) 50  MCG/ACT nasal spray Place 1 spray into both nostrils daily as needed (nasal drip).     losartan (COZAAR) 100 MG tablet Take 100 mg by mouth daily.     naproxen sodium (ALEVE) 220 MG tablet Take 440 mg by mouth daily.      STOP taking these medications     furosemide (LASIX) 40 MG tablet          Outstanding Labs/Studies   None  Duration of Discharge Encounter   Greater than 30 minutes including physician time.  Jonetta Speak NP 08/27/2015, 4:29 PM  Attending Note:   The patient was seen and examined.  Agree with assessment and plan as noted above.  Changes made to the above note as needed.  See progress note from day of discharge. Pt is stable for DC   Thayer Headings, Brooke Bonito., MD, [facility] 08/29/2015, 6:13 AM [address],  [place] Pager [phone]

## 2015-08-27 NOTE — Progress Notes (Signed)
Patient discharge to home at 4:40 pm accompanied by spouse and NT via wheelchair. Discharge instructions given. Patient verbalizes understanding. All personal belongings given. Telemetry box and IV removed prior to discharge and site in good condition.

## 2015-08-27 NOTE — Progress Notes (Signed)
Patient Name: Jacob Shannon Date of Encounter: 08/27/2015   SUBJECTIVE  No chest pain or sob. Complains for chest congestion and dry cough.  Cough yesterday showed minimal CAD , normal Right sided filling pressures.  To go home today    CURRENT MEDS . amLODipine  5 mg Oral Daily  . furosemide  40 mg Oral BID  . losartan  100 mg Oral Daily  . metoprolol succinate  75 mg Oral Daily  . sodium chloride flush  3 mL Intravenous Q12H  . sodium chloride flush  3 mL Intravenous Q12H    OBJECTIVE  Filed Vitals:   08/26/15 2029 08/27/15 0539 08/27/15 0923 08/27/15 1016  BP: 122/53 143/78 113/57 113/57  Pulse: 70 88 80 80  Temp: 98.1 F (36.7 C) 98 F (36.7 C) 97.9 F (36.6 C) 97.9 F (36.6 C)  TempSrc: Oral Oral Oral Oral  Resp: 18 18 18 21   Height:      Weight:  173 lb 12.8 oz (78.835 kg)    SpO2: 94% 99% 96% 96%    Intake/Output Summary (Last 24 hours) at 08/27/15 1216 Last data filed at 08/27/15 1107  Gross per 24 hour  Intake   1391 ml  Output    900 ml  Net    491 ml   Filed Weights   08/25/15 0424 08/26/15 0511 08/27/15 0539  Weight: 179 lb 3.2 oz (81.285 kg) 175 lb 14.4 oz (79.788 kg) 173 lb 12.8 oz (78.835 kg)    PHYSICAL EXAM  General: Pleasant, NAD. Neuro: Alert and oriented X 3. Moves all extremities spontaneously. Psych: Normal affect. HEENT:  Normal  Neck: Supple without bruits or JVD. Lungs:  Resp regular and unlabored, CTA. Heart: IR Ir no s3, s4. 2/6 late systolic mumur at LLSB to apex Abdomen: Soft, non-tender, non-distended, BS + x 4.  Extremities: No clubbing, cyanosis. 1+ pedal edema of RLE. DP 1+ and equal bilaterally.  Accessory Clinical Findings  CBC  Recent Labs  08/26/15 0430 08/27/15 0406  WBC 5.7 6.1  HGB 11.8* 13.4  HCT 37.8* 43.1  MCV 94.5 95.4  PLT 333 [id number]   Basic Metabolic Panel  Recent Labs  08/25/15 0400 08/26/15 0430 08/26/15 1030 08/27/15 0406  NA 139 138  --  140  K 3.4* 3.5  --  4.5  CL 99* 101  --   101  CO2 28 28  --  26  GLUCOSE 92 97  --  79  BUN 16 17  --  20  CREATININE 0.92 0.95  --  0.91  CALCIUM 8.6* 8.6*  --  8.8*  MG 2.2  --  2.3  --    Liver Function Tests No results for input(s): AST, ALT, ALKPHOS, BILITOT, PROT, ALBUMIN in the last 72 hours. No results for input(s): LIPASE, AMYLASE in the last 72 hours. Cardiac Enzymes No results for input(s): CKTOTAL, CKMB, CKMBINDEX, TROPONINI in the last 72 hours.  TELE  Afib at rate mostly of 80-90s. Went to 120s this morning. 12 beats of NSVT x 1.   Radiology/Studies  Dg Chest Port 1 View  08/24/2015  CLINICAL DATA:  Acute and chronic CHF, mitral regurgitation, former smoker. EXAM: PORTABLE CHEST 1 VIEW COMPARISON:  Chest x-ray of February 5th 2017 FINDINGS: The lungs are well-expanded. There is new patchy alveolar opacity in the retrocardiac region on the right. There are coarse lung markings just lateral to the left heart border which are not greatly changed. The cardiac silhouette is enlarged.  The pulmonary vascularity is not engorged. The mediastinum is normal in width. There is calcification within the wall of the descending thoracic aorta. The bony thorax is unremarkable. IMPRESSION: Cardiomegaly with mild central pulmonary vascular congestion but no definite pulmonary edema. New right basilar atelectasis or pneumonia. Underlying COPD. Electronically Signed   By: David  Martinique M.D.   On: 08/24/2015 07:28    ASSESSMENT AND PLAN  1. Acute on chronic diastolic CHF. -  He has progressive volume overload.   Cath showed normal R sided filling pressures. Minimal CAD  DC today     Will DC Lasix and start on torsemide 20 mg BID . Kdur 20 BID  Will need a BMP and office visit in the next week or so    2.Mitral Regurgitation. -  Moderate to severe by recent Echo with prolapse of the anterior leaflet. - TEE revealed moderate MR due to anterior leaflet prolapse of the A3-P3 interface and posteriorly directed eccentric jet,  moderate to severe TR and severe biatrial enlargement. There was normal LVF and moderate to severe atherosclerotic plaque in the ascending aorta.     3. Chronic AFib  - HR controlled. However elevated to 120s this morning.   Restarting pradaxa  4. HTN - controlled  5. Pulmonary HTN- secondary to #1 and #2.      Nahser, Wonda Cheng, MD  08/27/2015 12:26 PM    [address],  [place], [place]  [address] Pager [phone] Phone: [phone]; Fax: [phone]

## 2015-08-27 NOTE — Progress Notes (Signed)
Patient refused to take prescriptions for Potassium. Patient stated " I don't need prescriptions I have a lot of Potassium at home". Charge nurse notified.

## 2015-08-27 NOTE — Discharge Instructions (Signed)

## 2015-08-29 MED FILL — Heparin Sodium (Porcine) 2 Unit/ML in Sodium Chloride 0.9%: INTRAMUSCULAR | Qty: 1500 | Status: AC

## 2015-08-29 MED FILL — Verapamil HCl IV Soln 2.5 MG/ML: INTRAVENOUS | Qty: 2 | Status: AC

## 2015-08-31 DIAGNOSIS — I10 Essential (primary) hypertension: Secondary | ICD-10-CM | POA: Diagnosis not present

## 2015-08-31 DIAGNOSIS — R42 Dizziness and giddiness: Secondary | ICD-10-CM | POA: Diagnosis not present

## 2015-08-31 DIAGNOSIS — R29898 Other symptoms and signs involving the musculoskeletal system: Secondary | ICD-10-CM | POA: Diagnosis not present

## 2015-08-31 DIAGNOSIS — Z6828 Body mass index (BMI) 28.0-28.9, adult: Secondary | ICD-10-CM | POA: Diagnosis not present

## 2015-08-31 DIAGNOSIS — I509 Heart failure, unspecified: Secondary | ICD-10-CM | POA: Diagnosis not present

## 2015-08-31 DIAGNOSIS — E663 Overweight: Secondary | ICD-10-CM | POA: Diagnosis not present

## 2015-08-31 DIAGNOSIS — I4891 Unspecified atrial fibrillation: Secondary | ICD-10-CM | POA: Diagnosis not present

## 2015-09-09 ENCOUNTER — Other Ambulatory Visit: Payer: Self-pay

## 2015-09-09 ENCOUNTER — Ambulatory Visit (INDEPENDENT_AMBULATORY_CARE_PROVIDER_SITE_OTHER): Payer: Medicare Other | Admitting: Physician Assistant

## 2015-09-09 ENCOUNTER — Encounter: Payer: Self-pay | Admitting: Physician Assistant

## 2015-09-09 VITALS — BP 124/70 | HR 94 | Ht 68.0 in | Wt 181.7 lb

## 2015-09-09 DIAGNOSIS — I5032 Chronic diastolic (congestive) heart failure: Secondary | ICD-10-CM | POA: Diagnosis not present

## 2015-09-09 DIAGNOSIS — Z79899 Other long term (current) drug therapy: Secondary | ICD-10-CM

## 2015-09-09 DIAGNOSIS — Z7901 Long term (current) use of anticoagulants: Secondary | ICD-10-CM

## 2015-09-09 DIAGNOSIS — I341 Nonrheumatic mitral (valve) prolapse: Secondary | ICD-10-CM | POA: Diagnosis not present

## 2015-09-09 DIAGNOSIS — I4821 Permanent atrial fibrillation: Secondary | ICD-10-CM

## 2015-09-09 DIAGNOSIS — I482 Chronic atrial fibrillation: Secondary | ICD-10-CM

## 2015-09-09 LAB — BASIC METABOLIC PANEL
BUN: 30 mg/dL — ABNORMAL HIGH (ref 7–25)
CALCIUM: 8.9 mg/dL (ref 8.6–10.3)
CHLORIDE: 97 mmol/L — AB (ref 98–110)
CO2: 25 mmol/L (ref 20–31)
CREATININE: 0.99 mg/dL (ref 0.70–1.18)
Glucose, Bld: 76 mg/dL (ref 65–99)
Potassium: 5.2 mmol/L (ref 3.5–5.3)
Sodium: 136 mmol/L (ref 135–146)

## 2015-09-09 NOTE — Progress Notes (Signed)
Cardiology Office Note   Date:  09/09/2015   ID:  KOH AMOR, DOB [birth year], MRN [id number]  PCP:  Fae Pippin  Cardiologist:  Dr Martinique  Kerrie Timm, PA-C   Chief Complaint  Patient presents with  . Follow-up    post CATH & coronary angio w/out Intervention  Chronic AFIB      History of Present Illness: Jacob Shannon is a 75 y.o. male with a history of chronic atrial fibrillation, HTN, HL. He was found to have moderate-severe MR with prolapse of anterior mitral valve leaflet by echocardiogram 07/2015. Medical therapy was recommended.  D/c 03/11 after admit for CHF, had TEE but MR not severe enough to have surgery. Cath w/ minimal dz, nl R pressures. Weight 173 lbs.  Jacob Shannon presents for post-hospital followup.  Since d/c from the hospital, he has not had presyncope, chest pain or new DOE. He is compliant with his medications, but wants to change the timing. He was taking the Toprol XL 25 mg TID and wants to know if he can group the doses. He was also taking the Kdur 20 meq tid instead of bid. He has not had to take extra Demadex but wants to know if he can take 2 pills at once if he needs another one.   He denies orthopnea, PND or LE edema. He has not needed extra Demadex since dc. He does not have palpitations very often but can occasionally feel his heart beating. His weight has not changed much on his scales at home.    Past Medical History  Diagnosis Date  . Chronic atrial fibrillation ([place])   . Gout   . Essential hypertension   . Spinal stenosis   . 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 Hypercholesterolemia   . Leukocytoclastic vasculitis ([place])   . Diastolic heart failure ([place])   . 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 Mitral regurgitation     Moderate August 2016  . Complication of anesthesia     pt states that  "i'm hard to wake up"    Past Surgical History  Procedure  Laterality Date  . Leg skin lesion  biopsy / excision Right 2010  . Colonoscopy    . Cataract extraction Bilateral   . Tee without cardioversion N/A 08/25/2015    Procedure: TRANSESOPHAGEAL ECHOCARDIOGRAM (TEE);  Surgeon: Sanda Klein, MD;  Location: [facility] ENDOSCOPY;  Service: Cardiovascular;  Laterality: N/A;  . Cardiac catheterization N/A 08/26/2015    Procedure: Right/Left Heart Cath and Coronary Angiography;  Surgeon: Peter M Martinique, MD;  Location: [place] CV LAB;  Service: Cardiovascular;  Laterality: N/A;    Current Outpatient Prescriptions  Medication Sig Dispense Refill  . amLODipine (NORVASC) 5 MG tablet Take 5 mg by mouth daily.     . colchicine 0.6 MG tablet Take 0.6 mg by mouth daily as needed (gout attack).     . dabigatran (PRADAXA) 150 MG CAPS capsule Take 150 mg by mouth 2 (two) times daily.    . fluticasone (FLONASE) 50 MCG/ACT nasal spray Place 1 spray into both nostrils daily as needed (nasal drip).     Marland Kitchen losartan (COZAAR) 100 MG tablet Take 100 mg by mouth daily.     . metoprolol succinate (TOPROL-XL) 25 MG 24 hr tablet Take 3 tablets (75 mg total) by mouth daily. 90 tablet 11  . naproxen sodium (  ALEVE) 220 MG tablet Take 440 mg by mouth daily.    . potassium chloride SA (K-DUR,KLOR-CON) 20 MEQ tablet Take 1 tablet (20 mEq total) by mouth 2 (two) times daily. 60 tablet 11  . torsemide (DEMADEX) 20 MG tablet Take 1 tablet (20 mg total) by mouth 2 (two) times daily. 60 tablet 11   No current facility-administered medications for this visit.    Allergies:   Review of patient's allergies indicates no known allergies.    Social History:  The patient  reports that he quit smoking about 21 years ago. His smoking use included Cigarettes. He started smoking about 59 years ago. He has a 57 pack-year smoking history. He has never used smokeless tobacco. He reports that he drinks alcohol. He reports that he does not use illicit drugs.   Family History:  The patient's family  history includes Colon cancer in his brother; Coronary artery disease in his brother and father; Heart attack in his father; Stomach cancer in his brother. There is no history of Lung disease or Rheumatologic disease.    ROS:  Please see the history of present illness. All other systems are reviewed and negative.    PHYSICAL EXAM: VS:  BP 124/70 mmHg  Pulse 94  Ht 5\' 8"  (1.727 m)  Wt 181 lb 11.2 oz (82.419 kg)  BMI 27.63 kg/m2 , BMI Body mass index is 27.63 kg/(m^2). GEN: Well nourished, well developed, male in no acute distress HEENT: normal for age  Neck: no JVD, no carotid bruit, no masses Cardiac: Irreg irreg; soft murmur, no rubs, or gallops Respiratory:  Rales and few rhonchi R base, improves w/ cough, normal work of breathing GI: soft, nontender, nondistended, + BS MS: no deformity or atrophy; no edema; distal pulses are 1+ in both lower extremities, 2+ in upper extremities Skin: warm and dry, no rash Neuro:  Strength and sensation are intact Psych: euthymic mood, full affect   EKG:  EKG is not ordered today.  Recent Labs: 07/24/2015: TSH 1.870 08/23/2015: ALT 25; B Natriuretic Peptide 590.4* 08/26/2015: Magnesium 2.3 08/27/2015: BUN 20; Creatinine, Ser 0.91; Hemoglobin 13.4; Platelets 316; Potassium 4.5; Sodium 140    Lipid Panel    Component Value Date/Time   CHOL 130 07/26/2015 0423   TRIG 87 07/26/2015 0423   HDL 34* 07/26/2015 0423   CHOLHDL 3.8 07/26/2015 0423   VLDL 17 07/26/2015 0423   LDLCALC 79 07/26/2015 0423     Wt Readings from Last 3 Encounters:  09/09/15 181 lb 11.2 oz (82.419 kg)  08/27/15 173 lb 12.8 oz (78.835 kg)  08/23/15 194 lb (87.998 kg)     Other studies Reviewed: Additional studies/ records that were reviewed today include: Hospital records, procedure notes.  ASSESSMENT AND PLAN:  1.  Afib: rate is controlled, OK to take Toprol XL as 25 mg x 2 am, 1 pm  2. Chronic anticoagulation: CHADS2VASC=4, continue Xarelto, encouraged  compliance.  3. Chronic diastolic CHF: weight is up on scales, but resp status good by exam, continue Demadex. If he needs extra to manage fluid, increase am dose to 40 mg. Take extra K+ with extra Demadex. Ck BMET today to see how he is tolerating meds. Cut Kdur back to 20 meq bid if K+ trending up.   Current medicines are reviewed at length with the patient today.  The patient has concerns regarding medicines. All concerns were addressed  The following changes have been made:  Timing of doses, no daily amount changes, rx sent  in for prn Demadex  Labs/ tests ordered today include:   Orders Placed This Encounter  Procedures  . Basic metabolic panel     Disposition:   FU with Dr Martinique  Signed, Rosaria Ferries, PA-C  09/09/2015 12:50 PM    [place] Phone: [phone]; Fax: [phone]  This note was written with the assistance of speech recognition software. Please excuse any transcriptional errors.

## 2015-09-09 NOTE — Patient Instructions (Addendum)
Weigh daily Call [phone] if weight climbs more than 3 pounds in a day or 5 pounds in a week. No salt to very little salt in your diet.  No more than 2000 mg in a day. Call if increased shortness of breath or increased swelling.   Your physician has recommended you make the following change in your medication: the  Potasium has been continued as before, however you will need to take a extra dose if you take extra torsemide.  The Toprol has been changed to 2 tablets in the morning and 1 tablet in the evening.  Your physician recommends that you return for lab work TODAY.  Your physician recommends that you schedule a follow-up appointment in: 3 MONTHS  With Jacob Shannon.

## 2015-09-09 NOTE — Patient Outreach (Addendum)
[facility]) Care Management  09/09/2015  Jacob Shannon 1941/03/24 [id number]   Referral Date:  09/09/2015 Source:  [facility] Issue:  Red alert:   Missed follow-up appt?  Yes  New/worsening problems?  Yes Patient is eligible for [facility] services.  Patient declined services with [place] [facility] for home visits. Patient has signed up for [facility] HF calls with inpatient RN CM and agrees to continue with [facility] calls.   Outreach call to patient for Screening and Red alert follow-up.  Patient not reached. [phone] no answer.   Patient reached at cell # [phone] but working and had limited time to talk.   Patient confirms he completed follow-up with his MD this morning 09/09/2015.  States he had gained about 7-8 lbs and he has been eating a lot.  Confirms scales and BP cuff in the home.  States he is weighing daily but not logging.  Confirms he has a calendar to log on.   Providers: Primary MD: Dr. Cyndi Bender Cardiologist: Dr. Peter Martinique, [facility]: Lonn Georgia, PA-C - last appt 09/09/2015 HH:  None  Insurance:  Medicare and [place]: Patient lives in his home with his wife and is still working.  Mobility: Ambulates without any devices but states he recently had issues with one of his legs and had to use a cane.  Patient does not know what the issue is with his leg and has never reported or discussed with his MD.  Falls: none  Pain: none Transportation: yes Caregiver: wife Consent:  Yes [facility] Calls.   DME: cane, BP cuff, scales  THN conditions:  Combined Systolic and Diastolic CHF, Essential and Pulmonary HTN Admissions: 2 ER visits: 0 Last admission date:  08/23/2015 - 08/27/2015 CHF  Medications:  Patient taking less than 15 medications  Co-pay cost issues: none  Flu Vaccine: No - states he has been sick about all winter and was unable to get.  (05/25/2015 Per [facility] MR ) Pneumonia Vaccine:  11/24/2004 and 05/04/2014 (Per [facility]  MR)  Assessment and Plan:  Referral: 09/09/2015 [facility] Consult date:  09/09/2015 RN CM identifies that patient would benefit from [facility] services but patient will only agree to [facility] HF calls at this time.   CHF  RN CM advised to please notify MD of any changes in condition prior to scheduled appt's. -Anytime you have any of the following symptoms: 3 lb weight gain in 24 hours or 5 lbs in 1 week Shortness of breath, with or without a dry hacking cough Swelling in the hands, feet or stomach If you have to sleep on extra pillows at night in order to breathe.  -Continue with low sodium diet. -Increase activity slowly (patient is already working).  RN CM advised to log daily weights and take to MD appt.  RN CM encouraged to schedule follow-up with Primary MD and evaluate issue with leg.   RN CM advised in [facility] and alerts will be managed by [facility]. RN CM provided contact name and # [phone] or main office # [phone] and 24-hour nurse line # [phone].  RN CM confirmed patient is aware of 911 services for urgent emergency needs.    Mariann Laster, RN, BSN, [facility], CCM  [employer] Management Coordinator [phone] Direct [phone] Cell [phone] Office [phone] Fax

## 2015-09-15 ENCOUNTER — Other Ambulatory Visit: Payer: Self-pay

## 2015-10-10 DIAGNOSIS — Z09 Encounter for follow-up examination after completed treatment for conditions other than malignant neoplasm: Secondary | ICD-10-CM | POA: Diagnosis not present

## 2015-11-07 DIAGNOSIS — I10 Essential (primary) hypertension: Secondary | ICD-10-CM | POA: Diagnosis not present

## 2015-11-07 DIAGNOSIS — M4806 Spinal stenosis, lumbar region: Secondary | ICD-10-CM | POA: Diagnosis not present

## 2015-11-07 DIAGNOSIS — I4891 Unspecified atrial fibrillation: Secondary | ICD-10-CM | POA: Diagnosis not present

## 2015-11-07 DIAGNOSIS — E7800 Pure hypercholesterolemia, unspecified: Secondary | ICD-10-CM | POA: Diagnosis not present

## 2015-11-07 DIAGNOSIS — R609 Edema, unspecified: Secondary | ICD-10-CM | POA: Diagnosis not present

## 2015-11-07 DIAGNOSIS — I5032 Chronic diastolic (congestive) heart failure: Secondary | ICD-10-CM | POA: Diagnosis not present

## 2015-11-07 DIAGNOSIS — Z6830 Body mass index (BMI) 30.0-30.9, adult: Secondary | ICD-10-CM | POA: Diagnosis not present

## 2015-11-07 DIAGNOSIS — Z79899 Other long term (current) drug therapy: Secondary | ICD-10-CM | POA: Diagnosis not present

## 2015-11-16 DIAGNOSIS — J189 Pneumonia, unspecified organism: Secondary | ICD-10-CM | POA: Diagnosis not present

## 2015-11-16 DIAGNOSIS — I509 Heart failure, unspecified: Secondary | ICD-10-CM | POA: Diagnosis not present

## 2015-11-23 DIAGNOSIS — J690 Pneumonitis due to inhalation of food and vomit: Secondary | ICD-10-CM | POA: Diagnosis not present

## 2015-11-23 DIAGNOSIS — J189 Pneumonia, unspecified organism: Secondary | ICD-10-CM | POA: Diagnosis not present

## 2015-11-23 DIAGNOSIS — I509 Heart failure, unspecified: Secondary | ICD-10-CM | POA: Diagnosis not present

## 2015-11-23 DIAGNOSIS — Z6830 Body mass index (BMI) 30.0-30.9, adult: Secondary | ICD-10-CM | POA: Diagnosis not present

## 2015-11-23 DIAGNOSIS — R0981 Nasal congestion: Secondary | ICD-10-CM | POA: Diagnosis not present

## 2015-11-23 DIAGNOSIS — R05 Cough: Secondary | ICD-10-CM | POA: Diagnosis not present

## 2015-11-23 DIAGNOSIS — I517 Cardiomegaly: Secondary | ICD-10-CM | POA: Diagnosis not present

## 2015-12-09 ENCOUNTER — Encounter: Payer: Self-pay | Admitting: Cardiology

## 2015-12-12 ENCOUNTER — Ambulatory Visit (INDEPENDENT_AMBULATORY_CARE_PROVIDER_SITE_OTHER): Payer: Medicare Other | Admitting: Cardiology

## 2015-12-12 ENCOUNTER — Encounter: Payer: Self-pay | Admitting: Cardiology

## 2015-12-12 VITALS — BP 140/81 | HR 89 | Ht 68.0 in | Wt 190.0 lb

## 2015-12-12 DIAGNOSIS — I482 Chronic atrial fibrillation, unspecified: Secondary | ICD-10-CM

## 2015-12-12 DIAGNOSIS — I341 Nonrheumatic mitral (valve) prolapse: Secondary | ICD-10-CM | POA: Diagnosis not present

## 2015-12-12 DIAGNOSIS — I10 Essential (primary) hypertension: Secondary | ICD-10-CM

## 2015-12-12 DIAGNOSIS — I340 Nonrheumatic mitral (valve) insufficiency: Secondary | ICD-10-CM

## 2015-12-12 DIAGNOSIS — I5032 Chronic diastolic (congestive) heart failure: Secondary | ICD-10-CM

## 2015-12-12 NOTE — Progress Notes (Signed)
Cardiology Office Note:    Date:  12/12/2015   ID:  Jacob Shannon, DOB [birth year], MRN [id number]  PCP:  Jacob Shannon  Cardiologist:  Dr. Amanada Philbrick Shannon     Chief Complaint  Patient presents with  . Follow-up    dizziness; in the afternoons. edema; right leg    History of Present Illness:     Jacob Shannon is a 75 y.o. male with a hx of chronic atrial fibrillation, HTN, HL and MVP with MR.  Seen for follow up MR. Echocardiogram done in 8/16 at [facility] with normal EF, severe left atrial enlargement, moderate MR and mild to moderate TR with RVSP 49 mmHg. He has been evaluated by pulmonology in the recent past.   Records indicated that pulmonary hypertension is felt to be secondary to diastolic dysfunction and possibly mitral regurgitation. He has been noted to have bilateral pleural effusions in the past as well as some degree of mediastinal adenopathy. 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 Systemic or pulmonary process was not felt to be active.  Admitted 2/5-2/7 with lightheadedness associated with dyspnea, abdominal discomfort and nausea without emesis.  He was evaluated by cardiology. Cardiac enzymes were minimally elevated without clear trend.  Rate controlling medications were adjusted secondary to slow ventricular response. Digoxin was discontinued and metoprolol dose was decreased. Myoview was obtained as an inpatient. This demonstrated no ischemia and normal EF. Echocardiogram did demonstrate normal LV function with prolapse of anterior mitral valve leaflet and moderate to severe mitral regurgitation as well as massive biatrial enlargement and moderate pulmonary hypertension. At the time he was well compensated so medical therapy was continued.   In March he developed progressive CHF symptoms. He underwent TEE which showed moderate MR with eccentric jet. Right heart cath showed normal pulmonary and LV filling pressures. LV function was normal. No  significant CAD. He was managed medically.  On follow up today he reports that 2-3 weeks ago he had increased SOB and cough. Seen by Jacob Bender PA-C. CXR was clear. demadex dose increased and he was given medrol dose pack. States his back and legs felt the best in a long time. Breathing is fine now. Still has some chronic swelling in right leg for which he wears a compression stocking. Otherwise no PND, orthopnea, or dyspnea.   Past Medical History  Diagnosis Date  . Chronic atrial fibrillation ([place])   . Gout   . Essential hypertension   . Spinal stenosis   . 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 Hypercholesterolemia   . Leukocytoclastic vasculitis ([place])   . Diastolic heart failure ([place])   . 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 Mitral regurgitation     Moderate August 2016  . Complication of anesthesia     pt states that  "i'm hard to wake up"    Past Surgical History  Procedure Laterality Date  . Leg skin lesion  biopsy / excision Right 2010  . Colonoscopy    . Cataract extraction Bilateral   . Tee without cardioversion N/A 08/25/2015    Procedure: TRANSESOPHAGEAL ECHOCARDIOGRAM (TEE);  Surgeon: Jacob Klein, MD;  Location: [facility] ENDOSCOPY;  Service: Cardiovascular;  Laterality: N/A;  . Cardiac catheterization N/A 08/26/2015    Procedure: Right/Left Heart Cath and Coronary Angiography;  Surgeon: Jacob Heide M Martinique, MD;  Location: [facility] CV LAB;  Service: Cardiovascular;  Laterality: N/A;  Current Medications: Outpatient Prescriptions Prior to Visit  Medication Sig Dispense Refill  . amLODipine (NORVASC) 5 MG tablet Take 5 mg by mouth daily.     . colchicine 0.6 MG tablet Take 0.6 mg by mouth daily as needed (gout attack).     . dabigatran (PRADAXA) 150 MG CAPS capsule Take 150 mg by mouth 2 (two) times daily.    . fluticasone (FLONASE) 50 MCG/ACT nasal spray Place 1 spray into both nostrils daily as needed  (nasal drip).     Marland Kitchen losartan (COZAAR) 100 MG tablet Take 100 mg by mouth daily.     . metoprolol succinate (TOPROL-XL) 25 MG 24 hr tablet Take 3 tablets (75 mg total) by mouth daily. 90 tablet 11  . naproxen sodium (ALEVE) 220 MG tablet Take 440 mg by mouth daily.    . potassium chloride SA (K-DUR,KLOR-CON) 20 MEQ tablet Take 1 tablet (20 mEq total) by mouth daily. 90 tablet 3  . torsemide (DEMADEX) 20 MG tablet Take 1 tablet (20 mg total) by mouth 2 (two) times daily. 60 tablet 11   No facility-administered medications prior to visit.     Allergies:   Review of patient's allergies indicates no known allergies.   Social History   Social History  . Marital Status: Married    Spouse Name: N/A  . Number of Children: 2  . Years of Education: N/A   Occupational History  . self employed      [employer]   Social History Main Topics  . Smoking status: Former Smoker -- 1.50 packs/day for 38 years    Types: Cigarettes    Start date: 06/18/1956    Quit date: 06/18/1994  . Smokeless tobacco: Never Used  . Alcohol Use: 0.0 oz/week    0 Standard drinks or equivalent per week     Comment: Beer on a weekly basis.  . Drug Use: No  . Sexual Activity: Not Asked   Other Topics Concern  . None   Social History Narrative   Originally from [place]. Always lived in [place]. Prior travel to [facility]. 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 No pets currently. No bird, asbestos, mold, or hot tub exposure. He enjoys fishing.      Family History:  The patient's family history includes Colon cancer in his brother; Coronary artery disease in his brother and father; Heart attack in his father; Stomach cancer in his brother. There is no history of Lung disease or Rheumatologic disease.   ROS:   Please see the history of present illness.    Review of Systems  Constitution: Positive for weight gain.    Cardiovascular: Positive for leg swelling.  Respiratory: Positive for snoring.   All other systems reviewed and are negative.   Physical Exam:    VS:  BP 140/81 mmHg  Pulse 89  Ht 5\' 8"  (1.727 m)  Wt 190 lb (86.183 kg)  BMI 28.90 kg/m2   GEN: Well nourished, well developed, in no acute distress HEENT: normal Neck: no JVD, no masses Cardiac: Normal S1/S2, irreg irreg rhythm, 2/6 holosystolic murmur LLSB/apex, 1+ RLE  Edema- compression stocking in place.  no LLE edema.  Respiratory:  Clear; no wheezing, rhonchi or rales GI: soft, nontender  MS: no deformity or atrophy Skin: warm and dry  Neuro:  no focal deficits  Psych: Alert and oriented x 3, normal  affect  Wt Readings from Last 3 Encounters:  12/12/15 190 lb (86.183 kg)  09/09/15 181 lb 11.2 oz (82.419 kg)  08/27/15 173 lb 12.8 oz (78.835 kg)      Studies/Labs Reviewed:     EKG:  EKG is not ordered today.    Recent Labs: 07/24/2015: TSH 1.870 08/23/2015: ALT 25; B Natriuretic Peptide 590.4* 08/26/2015: Magnesium 2.3 08/27/2015: Hemoglobin 13.4; Platelets 316 09/09/2015: BUN 30*; Creat 0.99; Potassium 5.2; Sodium 136   Recent Lipid Panel    Component Value Date/Time   CHOL 130 07/26/2015 0423   TRIG 87 07/26/2015 0423   HDL 34* 07/26/2015 0423   CHOLHDL 3.8 07/26/2015 0423   VLDL 17 07/26/2015 0423   LDLCALC 79 07/26/2015 0423    Additional studies/ records that were reviewed today include:   Myoview 2/17 IMPRESSION: 1. Small fixed perfusion defect in the distal lateral wall without wall motion abnormality, likely myocardial thinning. No evidence of pharmacologically induced myocardial ischemia. 2. Mild septal dyskinesis with otherwise normal left ventricular wall motion. 3. Left ventricular ejection fraction 62% 4. Low-risk stress test findings*.  Echo 2/17 Moderate LVH, EF 55-60%, normal wall motion, mild AI, anterior leaflet MVP, moderate to severe MR, masses BAE, mild TR, PASP 56 mmHg  Right/Left Heart  Cath and Coronary Angiography    Conclusion     Prox LAD lesion, 25% stenosed.  1. Minor nonobstructive CAD 2. Normal LV function. 3. Moderate mitral insufficiency. 4. Normal right heart pressures. 5. Normal LV filling pressures.   Plan: medical management. MR does not appear severe enough to warrant repair. Will switch lasix to po. May resume Pradaxa in am. Anticipate DC tomorrow.     Indications    Acute diastolic CHF (congestive heart failure) (HCC) [I50.31 (ICD-10-CM)]    Technique and Indications    Procedural Details: The right wrist was prepped, draped, and anesthetized with 1% lidocaine. Using the modified Seldinger technique a 6 Fr slender sheath was placed in the right radial artery. Due to heavy calcification of the right innominate artery we were able to pass a JR4 catheter but no other catheters would pass including a 4 Fr left catheter. We then completed the study using a femoral approach. The right groin was prepped and draped. The groin was anesthetized with 1% lidocaine. Using a modified Seldinger technique a 5 Fr sheath was placed in the right femoral artery and a 7 French sheath was placed in the right femoral vein. A Swan-Ganz catheter was used for the right heart catheterization. Standard protocol was followed for recording of right heart pressures and sampling of oxygen saturations. Fick cardiac output was calculated. Standard Judkins catheters were used for selective coronary angiography and left ventriculography. There were no immediate procedural complications. The patient was transferred to the post catheterization recovery area for further monitoring. Contrast: 70 cc  During this procedure the patient is administered a total of Versed 2 mg and Fentanyl 50 mg to achieve and maintain moderate conscious sedation. The patient's heart rate, blood pressure, and oxygen saturation are monitored continuously during the procedure. The period of conscious sedation is 45  minutes, of which I was present face-to-face 100% of this time.  Estimated blood loss <50 mL. There were no immediate complications during the procedure.    Coronary Findings    Dominance: Right   Left Main  Vessel was injected. Vessel is normal in caliber. Vessel is angiographically normal.     Left Anterior Descending   . Prox LAD lesion, 25%  stenosed. Moderately Calcified.     Left Circumflex  Vessel was injected. Vessel is normal in caliber. Vessel is angiographically normal.     Right Coronary Artery  Vessel was injected. Vessel is normal in caliber. Vessel is angiographically normal.       Right Heart Pressures Hemodynamic findings consistent with mitral valve regurgitation. LV EDP is normal. Normal right heart pressures.    Wall Motion                 Left Heart    Mitral Valve There is moderate (3+) mitral regurgitation.    Coronary Diagrams    Diagnostic Diagram             TEE 08/25/15:Study Conclusions  - Left ventricle: Systolic function was normal. The estimated  ejection fraction was in the range of 55% to 60%. The study is  not technically sufficient to allow evaluation of LV diastolic  function. - Aortic valve: No evidence of vegetation. There was mild  regurgitation. - Mitral valve: There was moderate (at most moderate-to-severe)  regurgitation directed eccentrically and posteriorly. - Left atrium: The atrium was severely dilated. No evidence of  thrombus in the atrial cavity or appendage. No spontaneous echo  contrast was observed. - Right atrium: The atrium was severely dilated. No evidence of  thrombus in the atrial cavity or appendage. - Atrial septum: No defect or patent foramen ovale was identified.  Echo contrast study showed no right-to-left atrial level shunt,  following an increase in RA pressure induced by provocative  maneuvers. - Tricuspid valve: No evidence of vegetation. No evidence of  vegetation.  There was moderate regurgitation directed centrally. - Pulmonary arteries: PA peak pressure: 50 mm Hg (S).  Impressions:  - The severity of mitral insuficiency appeared dependent on the  severity of the elevation in systolic BP (very high at study  onset, normal by the time of study completion). The eccentric jet  and the presence of severe left atrial dilation may lead to  underestimation of MR severity. In aggregate, however, the mitral  regurgitation does not appear to be severe. ASSESSMENT:     1. Chronic atrial fibrillation ([place])   2. Chronic diastolic CHF (congestive heart failure), NYHA class 2 ([place])   3. Essential hypertension   4. Mitral valve prolapse   5. Mitral regurgitation     PLAN:      1. Chronic diastolic CHF. Good response to diuretic therapy with resolution of CHF symptoms.  Findings on cardiac cath and TEE. Reassuring. At this point will continue medical therapy with diuretics. If CHF symptoms progress may need to consider MV repair.   2.Mitra Regurgitation - Moderate to severe by recent Echo and TEE with prolapse of the anterior leaflet.   3. Chronic AFib - Rate controlled now. Continue metoprolol. On Pradaxa for anticoagulation.   4. HTN - Controlled.   5. Pulmonary HTN- normal pressures by cardiac cath.    Medication Adjustments/Labs and Tests Ordered: Above plan discussed at length with patient and his son. Plan hospital admission today.   Patient Instructions  Continue your current therapy  I will see you in 4-6 months      Signed, Aahana Elza Martinique, MD  12/12/2015 9:15 AM    [facility] [address], [place], [place]  [address] Phone: [phone]; Fax: [phone]

## 2015-12-12 NOTE — Patient Instructions (Signed)
Continue your current therapy  I will see you in 4-6 months  

## 2015-12-13 ENCOUNTER — Encounter: Payer: Self-pay | Admitting: Cardiology

## 2016-01-05 DIAGNOSIS — D0422 Carcinoma in situ of skin of left ear and external auricular canal: Secondary | ICD-10-CM | POA: Diagnosis not present

## 2016-01-05 DIAGNOSIS — L570 Actinic keratosis: Secondary | ICD-10-CM | POA: Diagnosis not present

## 2016-01-26 DIAGNOSIS — Z6831 Body mass index (BMI) 31.0-31.9, adult: Secondary | ICD-10-CM | POA: Diagnosis not present

## 2016-01-26 DIAGNOSIS — I776 Arteritis, unspecified: Secondary | ICD-10-CM | POA: Diagnosis not present

## 2016-01-27 DIAGNOSIS — I776 Arteritis, unspecified: Secondary | ICD-10-CM | POA: Diagnosis not present

## 2016-03-15 DIAGNOSIS — I10 Essential (primary) hypertension: Secondary | ICD-10-CM | POA: Diagnosis not present

## 2016-03-15 DIAGNOSIS — I5032 Chronic diastolic (congestive) heart failure: Secondary | ICD-10-CM | POA: Diagnosis not present

## 2016-03-15 DIAGNOSIS — R609 Edema, unspecified: Secondary | ICD-10-CM | POA: Diagnosis not present

## 2016-03-15 DIAGNOSIS — I4891 Unspecified atrial fibrillation: Secondary | ICD-10-CM | POA: Diagnosis not present

## 2016-03-15 DIAGNOSIS — M4806 Spinal stenosis, lumbar region: Secondary | ICD-10-CM | POA: Diagnosis not present

## 2016-03-15 DIAGNOSIS — Z125 Encounter for screening for malignant neoplasm of prostate: Secondary | ICD-10-CM | POA: Diagnosis not present

## 2016-03-15 DIAGNOSIS — M109 Gout, unspecified: Secondary | ICD-10-CM | POA: Diagnosis not present

## 2016-03-15 DIAGNOSIS — Z0000 Encounter for general adult medical examination without abnormal findings: Secondary | ICD-10-CM | POA: Diagnosis not present

## 2016-03-15 DIAGNOSIS — Z23 Encounter for immunization: Secondary | ICD-10-CM | POA: Diagnosis not present

## 2016-03-15 DIAGNOSIS — E7800 Pure hypercholesterolemia, unspecified: Secondary | ICD-10-CM | POA: Diagnosis not present

## 2016-03-15 DIAGNOSIS — Z6832 Body mass index (BMI) 32.0-32.9, adult: Secondary | ICD-10-CM | POA: Diagnosis not present

## 2016-08-31 ENCOUNTER — Other Ambulatory Visit: Payer: Self-pay | Admitting: Physician Assistant

## 2016-08-31 NOTE — Telephone Encounter (Signed)
Rx(s) sent to pharmacy electronically.  

## 2016-09-06 ENCOUNTER — Other Ambulatory Visit: Payer: Self-pay | Admitting: Cardiology

## 2016-09-06 MED ORDER — POTASSIUM CHLORIDE CRYS ER 20 MEQ PO TBCR: 20.0000 meq | EXTENDED_RELEASE_TABLET | Freq: Every day | ORAL | 0 refills | Status: DC

## 2016-09-11 ENCOUNTER — Telehealth: Payer: Self-pay | Admitting: Cardiology

## 2016-09-11 MED ORDER — METOPROLOL SUCCINATE ER 25 MG PO TB24: 25.0000 mg | ORAL_TABLET | Freq: Three times a day (TID) | ORAL | 0 refills | Status: AC

## 2016-09-11 NOTE — Telephone Encounter (Signed)
Returned call to [place] at [facility]. Explained that it looks like Rx was incorrectly dosed when refilled by our staff recently. Prior OV note states pt takes 25mg  TID (this corroborates with dose patient reported to caller Jeanette Caprice) but it was refilled at 25mg  daily. Informed caller I sent dose correction to [facility]. Advised that patient will need appt -- overdue 6 month f/u. She voiced she will make patient aware. No further questions or needs identified at this time.

## 2016-09-11 NOTE — Telephone Encounter (Signed)
Please call,concerning his Metoprolol,pt thinks Dr Martinique have changed it.Jacob Shannon

## 2016-09-14 ENCOUNTER — Telehealth: Payer: Self-pay | Admitting: Cardiology

## 2016-09-14 NOTE — Telephone Encounter (Signed)
Returned call to [place] at [facility]-wanted to verify patient is to take 25mg  toprol xl 3x daily instead of metoprolol tartrate TID.  Advised per pervious OV note from 3/24 Suanne Marker Barrett PA, also no changes noted on most recent OV with Dr. Martinique 6/26):  1.  Afib: rate is controlled, OK to take Toprol XL as 25 mg x 2 am, 1 pm   Aware and verbalized understanding.

## 2016-09-14 NOTE — Telephone Encounter (Signed)
New message    Juliann Pulse with [facility] calling regarding patient's prescription for metoprolol succinate (TOPROL-XL) 25 MG 24 hr tablet. She says this is long acting & won't be needed 3 times daily, wants to know if Dr. Martinique wants to switch the medication so he can take 3 times a day.

## 2016-09-17 DIAGNOSIS — I4891 Unspecified atrial fibrillation: Secondary | ICD-10-CM | POA: Diagnosis not present

## 2016-09-17 DIAGNOSIS — I5032 Chronic diastolic (congestive) heart failure: Secondary | ICD-10-CM | POA: Diagnosis not present

## 2016-09-17 DIAGNOSIS — M109 Gout, unspecified: Secondary | ICD-10-CM | POA: Diagnosis not present

## 2016-09-17 DIAGNOSIS — Z6832 Body mass index (BMI) 32.0-32.9, adult: Secondary | ICD-10-CM | POA: Diagnosis not present

## 2016-09-17 DIAGNOSIS — I10 Essential (primary) hypertension: Secondary | ICD-10-CM | POA: Diagnosis not present

## 2016-09-17 DIAGNOSIS — E7800 Pure hypercholesterolemia, unspecified: Secondary | ICD-10-CM | POA: Diagnosis not present

## 2016-09-17 DIAGNOSIS — E669 Obesity, unspecified: Secondary | ICD-10-CM | POA: Diagnosis not present

## 2016-09-20 ENCOUNTER — Telehealth: Payer: Self-pay

## 2016-09-20 NOTE — Telephone Encounter (Signed)
Spoke to patient I received refill request for metoprolol after reviewing your chart you need follow up appointment with Jacob Shannon.Stated Jacob Shannon picked up metoprolol refill yesterday, but Jacob Shannon received short acting.Stated Jacob Shannon has always took long acting.Advised ok to take metoprolol tartrate 25 mg three times a day instead of succinate.Appointment scheduled with Jacob Shannon 11/19/16 at 9:00 am. Spoke to Jacob Shannon pharmacist at Jacob Shannon.She stated patient's PCP Jacob Shannon wrote new prescription for metoprolol tartrate 25 mg three times a day.

## 2016-10-25 ENCOUNTER — Encounter: Payer: Self-pay | Admitting: *Deleted

## 2016-11-16 NOTE — Progress Notes (Signed)
Cardiology Office Note:    Date:  11/19/2016   ID:  Jacob Shannon, DOB [birth year], MRN [id number]  PCP:  Cyndi Bender, PA-C  Cardiologist:  Dr. Lyda Colcord [place]     Chief Complaint  Patient presents with  . Follow-up  . Dizziness    when getting up too fast; when walking randomly,  . Atrial Fibrillation  . Congestive Heart Failure    History of Present Illness:     Jacob Shannon is a 76 y.o. male with a hx of chronic atrial fibrillation, HTN, HL and MVP with MR.  Seen for follow up MR. Echocardiogram done in 8/16 at [facility] with normal EF, severe left atrial enlargement, moderate MR and mild to moderate TR with RVSP 49 mmHg. He has been evaluated by pulmonology.   Records indicated that pulmonary hypertension is felt to be secondary to diastolic dysfunction and possibly mitral regurgitation. He has been noted to have bilateral pleural effusions in the past as well as some degree of mediastinal adenopathy. 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 Systemic or pulmonary process was not felt to be active.  Admitted 2/5-07/25/16 with lightheadedness associated with dyspnea, abdominal discomfort and nausea without emesis.  He was evaluated by cardiology. Cardiac enzymes were minimally elevated without clear trend.  Rate controlling medications were adjusted secondary to slow ventricular response. Digoxin was discontinued and metoprolol dose was decreased. Myoview was obtained as an inpatient. This demonstrated no ischemia and normal EF. Echocardiogram did demonstrate normal LV function with prolapse of anterior mitral valve leaflet and moderate to severe mitral regurgitation as well as massive biatrial enlargement and moderate pulmonary hypertension. At the time he was well compensated so medical therapy was continued.   In March he developed progressive CHF symptoms. He underwent TEE which showed moderate MR with eccentric jet. Right heart cath showed normal pulmonary and  LV filling pressures. LV function was normal. No significant CAD. He was managed medically.  On follow up today he complains of lightheadedness when standing in the later part of the day. Resolves with rest. Still has chronic swelling in right leg and wear a compression sleeve.  Breathing is fine now.  No PND, orthopnea, or dyspnea. Complains of gout. Currently taking demadex 40 mg bid. Reports BP at home in 765Y systolic. Weight is up 9 lbs.  Past Medical History:  Diagnosis Date  . Chronic atrial fibrillation ([place])   . Complication of anesthesia    pt states that  "i'm hard to wake up"  . Diastolic heart failure ([place])   . Essential hypertension   . Gout   . Hypercholesterolemia   . Leukocytoclastic vasculitis ([place])   . Mitral regurgitation    Moderate August 2016  . Secondary pulmonary hypertension    RVSP 49 mmHg August 2016  . Spinal stenosis   . 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 CARDIAC CATHETERIZATION N/A 08/26/2015   Procedure: Right/Left Heart Cath and Coronary Angiography;  Surgeon: Deasiah Hagberg M [place], MD;  Location: [facility];  Service: Cardiovascular;  Laterality: N/A;  . CATARACT EXTRACTION Bilateral   . COLONOSCOPY    . LEG SKIN LESION  BIOPSY / EXCISION Right 2010  . TEE WITHOUT CARDIOVERSION N/A 08/25/2015   Procedure: TRANSESOPHAGEAL ECHOCARDIOGRAM (TEE);  Surgeon: Sanda Klein, MD;  Location: [facility] ENDOSCOPY;  Service: Cardiovascular;  Laterality: N/A;    Current Medications:  Outpatient Medications Prior to Visit  Medication Sig Dispense Refill  . amLODipine (NORVASC) 5 MG tablet Take 5 mg by mouth daily.     . colchicine 0.6 MG tablet Take 0.6 mg by mouth daily as needed (gout attack).     . dabigatran (PRADAXA) 150 MG CAPS capsule Take 150 mg by mouth 2 (two) times daily.    . fluticasone (FLONASE) 50 MCG/ACT nasal spray Place 1 spray into both nostrils daily as needed  (nasal drip).     Marland Kitchen losartan (COZAAR) 100 MG tablet Take 100 mg by mouth daily.     . metoprolol succinate (TOPROL-XL) 25 MG 24 hr tablet Take 1 tablet (25 mg total) by mouth 3 (three) times daily. PLEASE CONTACT OFFICE FOR ADDITIONAL REFILLS 90 tablet 0  . naproxen sodium (ALEVE) 220 MG tablet Take 440 mg by mouth daily.    . potassium chloride SA (K-DUR,KLOR-CON) 20 MEQ tablet Take 1 tablet (20 mEq total) by mouth daily. 90 tablet 0  . torsemide (DEMADEX) 20 MG tablet Take 1 tablet (20 mg total) by mouth 2 (two) times daily. 60 tablet 11   No facility-administered medications prior to visit.      Allergies:   Patient has no known allergies.   Social History   Social History  . Marital status: Married    Spouse name: N/A  . Number of children: 2  . Years of education: N/A   Occupational History  . self employed      [employer]   Social History Main Topics  . Smoking status: Former Smoker    Packs/day: 1.50    Years: 38.00    Types: Cigarettes    Start date: 06/18/1956    Quit date: 06/18/1994  . Smokeless tobacco: Never Used  . Alcohol use 0.0 oz/week     Comment: Beer on a weekly basis.  . Drug use: No  . Sexual activity: Not Asked   Other Topics Concern  . None   Social History Narrative   Originally from [place]. Always lived in [place]. Prior travel to [facility]. 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 No pets currently. No bird, asbestos, mold, or hot tub exposure. He enjoys fishing.      Family History:  The patient's family history includes Colon cancer in his brother; Coronary artery disease in his brother and father; Heart attack in his father; Stomach cancer in his brother.   ROS:   Please see the history of present illness.    Review of Systems  Constitution: Positive for weight gain.  Cardiovascular: Positive for leg swelling.  Musculoskeletal: Positive for arthritis.   All other systems reviewed and are negative.   Physical Exam:    VS:  BP (!) 168/89   Pulse 86   Ht 5' 8.5" (1.74 m)   Wt 199 lb 3.2 oz (90.4 kg)   BMI 29.85 kg/m    GEN: Well nourished, well developed, in no acute distress  HEENT: normal  Neck: no JVD, no masses Cardiac: Normal S1/S2, irreg irreg rhythm, 3/6 holosystolic harsh murmur LLSB/apex, 1+ RLE  Edema- compression stocking in place.  no LLE edema.  Respiratory:  Clear; no wheezing, rhonchi or rales GI: soft, nontender  MS: no deformity or atrophy  Skin: warm and dry  Neuro:  no focal deficits  Psych: Alert and oriented x 3, normal affect  Wt Readings from Last 3 Encounters:  11/19/16 199 lb 3.2 oz (90.4 kg)  12/12/15 190 lb (86.2 kg)  09/09/15 181 lb 11.2 oz (82.4 kg)      Studies/Labs Reviewed:     EKG:  EKG is  ordered today.  Afib with rate 86. LAD. I have personally reviewed and interpreted this study.   Recent Labs: No results found for requested labs within last 8760 hours.   Recent Lipid Panel    Component Value Date/Time   CHOL 130 07/26/2015 0423   TRIG 87 07/26/2015 0423   HDL 34 (L) 07/26/2015 0423   CHOLHDL 3.8 07/26/2015 0423   VLDL 17 07/26/2015 0423   LDLCALC 79 07/26/2015 0423   Labs dated 09/17/16: cholesterol 118, triglycerides 94. HDL 35, LDL 64. CMET normal.  Additional studies/ records that were reviewed today include:   Myoview 2/17 IMPRESSION: 1. Small fixed perfusion defect in the distal lateral wall without wall motion abnormality, likely myocardial thinning. No evidence of pharmacologically induced myocardial ischemia. 2. Mild septal dyskinesis with otherwise normal left ventricular wall motion. 3. Left ventricular ejection fraction 62% 4. Low-risk stress test findings*.  Echo 2/17 Moderate LVH, EF 55-60%, normal wall motion, mild AI, anterior leaflet MVP, moderate to severe MR, masses BAE, mild TR, PASP 56 mmHg  Right/Left Heart Cath and Coronary Angiography      Conclusion     Prox LAD lesion, 25% stenosed.  1. Minor nonobstructive CAD 2. Normal LV function. 3. Moderate mitral insufficiency. 4. Normal right heart pressures. 5. Normal LV filling pressures.   Plan: medical management. MR does not appear severe enough to warrant repair. Will switch lasix to po. May resume Pradaxa in am. Anticipate DC tomorrow.     Indications    Acute diastolic CHF (congestive heart failure) (HCC) [I50.31 (ICD-10-CM)]    Technique and Indications    Procedural Details: The right wrist was prepped, draped, and anesthetized with 1% lidocaine. Using the modified Seldinger technique a 6 Fr slender sheath was placed in the right radial artery. Due to heavy calcification of the right innominate artery we were able to pass a JR4 catheter but no other catheters would pass including a 4 Fr left catheter. We then completed the study using a femoral approach. The right groin was prepped and draped. The groin was anesthetized with 1% lidocaine. Using a modified Seldinger technique a 5 Fr sheath was placed in the right femoral artery and a 7 French sheath was placed in the right femoral vein. A Swan-Ganz catheter was used for the right heart catheterization. Standard protocol was followed for recording of right heart pressures and sampling of oxygen saturations. Fick cardiac output was calculated. Standard Judkins catheters were used for selective coronary angiography and left ventriculography. There were no immediate procedural complications. The patient was transferred to the post catheterization recovery area for further monitoring. Contrast: 70 cc  During this procedure the patient is administered a total of Versed 2 mg and Fentanyl 50 mg to achieve and maintain moderate conscious sedation. The patient's heart rate, blood pressure, and oxygen saturation are monitored continuously during the procedure. The period of conscious sedation is 45 minutes, of which I was present  face-to-face 100% of this time.  Estimated blood loss <50 mL. There were no immediate complications during the procedure.    Coronary Findings    Dominance: Right   Left Main  Vessel was injected. Vessel is normal in caliber. Vessel is angiographically normal.     Left  Anterior Descending   . Prox LAD lesion, 25% stenosed. Moderately Calcified.     Left Circumflex  Vessel was injected. Vessel is normal in caliber. Vessel is angiographically normal.     Right Coronary Artery  Vessel was injected. Vessel is normal in caliber. Vessel is angiographically normal.       Right Heart Pressures Hemodynamic findings consistent with mitral valve regurgitation. LV EDP is normal. Normal right heart pressures.    Wall Motion                 Left Heart    Mitral Valve There is moderate (3+) mitral regurgitation.    Coronary Diagrams    Diagnostic Diagram             TEE 08/25/15:Study Conclusions  - Left ventricle: Systolic function was normal. The estimated  ejection fraction was in the range of 55% to 60%. The study is  not technically sufficient to allow evaluation of LV diastolic  function. - Aortic valve: No evidence of vegetation. There was mild  regurgitation. - Mitral valve: There was moderate (at most moderate-to-severe)  regurgitation directed eccentrically and posteriorly. - Left atrium: The atrium was severely dilated. No evidence of  thrombus in the atrial cavity or appendage. No spontaneous echo  contrast was observed. - Right atrium: The atrium was severely dilated. No evidence of  thrombus in the atrial cavity or appendage. - Atrial septum: No defect or patent foramen ovale was identified.  Echo contrast study showed no right-to-left atrial level shunt,  following an increase in RA pressure induced by provocative  maneuvers. - Tricuspid valve: No evidence of vegetation. No evidence of  vegetation. There was moderate  regurgitation directed centrally. - Pulmonary arteries: PA peak pressure: 50 mm Hg (S).  Impressions:  - The severity of mitral insuficiency appeared dependent on the  severity of the elevation in systolic BP (very high at study  onset, normal by the time of study completion). The eccentric jet  and the presence of severe left atrial dilation may lead to  underestimation of MR severity. In aggregate, however, the mitral  regurgitation does not appear to be severe. ASSESSMENT:     1. Chronic diastolic CHF (congestive heart failure), NYHA class 2 ([place])   2. Chronic atrial fibrillation (HCC)   3. Mitral valve prolapse   4. Non-rheumatic mitral regurgitation   5. Essential hypertension     PLAN:      1. Chronic diastolic CHF. Although weight is increased there is no evidence of increased volume overload on exam.  Findings on cardiac cath and TEE from last year reviewed. At this point will continue medical therapy. Will reduce demadex to 40 mg in am and 20 mg in pm to minimize orthostatic symptoms.  If CHF symptoms progress we will  need to consider MV repair.   2.Mitra Regurgitation - Moderate to severe by last Echo and TEE with prolapse of the anterior leaflet. Normal right heart pressures at the time of cath. Will update Echo now. I would have a low threshold for recommending MV repair if symptoms worsen.   3. Chronic AFib - Rate controlled now. Continue metoprolol. On Pradaxa for anticoagulation.   4. HTN - elevated today but has been controlled at home.  5. Pulmonary HTN- normal pressures by cardiac cath.   6. Gout.    Medication Adjustments/Labs and Tests Ordered: Above plan discussed at length with patient and his son. Plan hospital admission today.   Patient Instructions  Reduce Demadex to 40 mg in the morning and 20 mg in the evening  We will schedule you for an Echocardiogram  I will see you in 6 months.        Signed, Khayman Kirsch [place], MD  11/19/2016 9:22  AM    [facility] [address], [place], [place]  [address] Phone: [phone]; Fax: [phone]

## 2016-11-19 ENCOUNTER — Other Ambulatory Visit: Payer: Self-pay

## 2016-11-19 ENCOUNTER — Ambulatory Visit (INDEPENDENT_AMBULATORY_CARE_PROVIDER_SITE_OTHER): Payer: Medicare Other | Admitting: Cardiology

## 2016-11-19 ENCOUNTER — Encounter: Payer: Self-pay | Admitting: Cardiology

## 2016-11-19 VITALS — BP 168/89 | HR 86 | Ht 68.5 in | Wt 199.2 lb

## 2016-11-19 DIAGNOSIS — I341 Nonrheumatic mitral (valve) prolapse: Secondary | ICD-10-CM | POA: Diagnosis not present

## 2016-11-19 DIAGNOSIS — I5032 Chronic diastolic (congestive) heart failure: Secondary | ICD-10-CM

## 2016-11-19 DIAGNOSIS — I482 Chronic atrial fibrillation, unspecified: Secondary | ICD-10-CM

## 2016-11-19 DIAGNOSIS — I10 Essential (primary) hypertension: Secondary | ICD-10-CM | POA: Diagnosis not present

## 2016-11-19 DIAGNOSIS — I340 Nonrheumatic mitral (valve) insufficiency: Secondary | ICD-10-CM | POA: Diagnosis not present

## 2016-11-19 NOTE — Patient Instructions (Addendum)
Reduce Demadex to 40 mg in the morning and 20 mg in the evening  We will schedule you for an Echocardiogram  I will see you in 6 months.

## 2016-11-22 DIAGNOSIS — M9904 Segmental and somatic dysfunction of sacral region: Secondary | ICD-10-CM | POA: Diagnosis not present

## 2016-11-22 DIAGNOSIS — M9905 Segmental and somatic dysfunction of pelvic region: Secondary | ICD-10-CM | POA: Diagnosis not present

## 2016-11-22 DIAGNOSIS — M5431 Sciatica, right side: Secondary | ICD-10-CM | POA: Diagnosis not present

## 2016-11-22 DIAGNOSIS — M9903 Segmental and somatic dysfunction of lumbar region: Secondary | ICD-10-CM | POA: Diagnosis not present

## 2016-11-22 DIAGNOSIS — M5432 Sciatica, left side: Secondary | ICD-10-CM | POA: Diagnosis not present

## 2016-11-22 DIAGNOSIS — M5137 Other intervertebral disc degeneration, lumbosacral region: Secondary | ICD-10-CM | POA: Diagnosis not present

## 2016-11-22 DIAGNOSIS — Q72891 Other reduction defects of right lower limb: Secondary | ICD-10-CM | POA: Diagnosis not present

## 2016-11-26 DIAGNOSIS — M9903 Segmental and somatic dysfunction of lumbar region: Secondary | ICD-10-CM | POA: Diagnosis not present

## 2016-11-26 DIAGNOSIS — M5432 Sciatica, left side: Secondary | ICD-10-CM | POA: Diagnosis not present

## 2016-11-26 DIAGNOSIS — M9905 Segmental and somatic dysfunction of pelvic region: Secondary | ICD-10-CM | POA: Diagnosis not present

## 2016-11-26 DIAGNOSIS — M5431 Sciatica, right side: Secondary | ICD-10-CM | POA: Diagnosis not present

## 2016-11-26 DIAGNOSIS — M5137 Other intervertebral disc degeneration, lumbosacral region: Secondary | ICD-10-CM | POA: Diagnosis not present

## 2016-11-26 DIAGNOSIS — M9904 Segmental and somatic dysfunction of sacral region: Secondary | ICD-10-CM | POA: Diagnosis not present

## 2016-11-26 DIAGNOSIS — Q72891 Other reduction defects of right lower limb: Secondary | ICD-10-CM | POA: Diagnosis not present

## 2016-11-27 DIAGNOSIS — M9905 Segmental and somatic dysfunction of pelvic region: Secondary | ICD-10-CM | POA: Diagnosis not present

## 2016-11-27 DIAGNOSIS — M5432 Sciatica, left side: Secondary | ICD-10-CM | POA: Diagnosis not present

## 2016-11-27 DIAGNOSIS — M5137 Other intervertebral disc degeneration, lumbosacral region: Secondary | ICD-10-CM | POA: Diagnosis not present

## 2016-11-27 DIAGNOSIS — Q72891 Other reduction defects of right lower limb: Secondary | ICD-10-CM | POA: Diagnosis not present

## 2016-11-27 DIAGNOSIS — M9904 Segmental and somatic dysfunction of sacral region: Secondary | ICD-10-CM | POA: Diagnosis not present

## 2016-11-27 DIAGNOSIS — M5431 Sciatica, right side: Secondary | ICD-10-CM | POA: Diagnosis not present

## 2016-11-27 DIAGNOSIS — M9903 Segmental and somatic dysfunction of lumbar region: Secondary | ICD-10-CM | POA: Diagnosis not present

## 2016-11-29 DIAGNOSIS — M5137 Other intervertebral disc degeneration, lumbosacral region: Secondary | ICD-10-CM | POA: Diagnosis not present

## 2016-11-29 DIAGNOSIS — M9903 Segmental and somatic dysfunction of lumbar region: Secondary | ICD-10-CM | POA: Diagnosis not present

## 2016-11-29 DIAGNOSIS — M5432 Sciatica, left side: Secondary | ICD-10-CM | POA: Diagnosis not present

## 2016-11-29 DIAGNOSIS — M9905 Segmental and somatic dysfunction of pelvic region: Secondary | ICD-10-CM | POA: Diagnosis not present

## 2016-11-29 DIAGNOSIS — Q72891 Other reduction defects of right lower limb: Secondary | ICD-10-CM | POA: Diagnosis not present

## 2016-11-29 DIAGNOSIS — M5431 Sciatica, right side: Secondary | ICD-10-CM | POA: Diagnosis not present

## 2016-11-29 DIAGNOSIS — M9904 Segmental and somatic dysfunction of sacral region: Secondary | ICD-10-CM | POA: Diagnosis not present

## 2016-11-30 ENCOUNTER — Ambulatory Visit (HOSPITAL_COMMUNITY): Payer: Medicare Other | Attending: Internal Medicine

## 2016-11-30 ENCOUNTER — Other Ambulatory Visit: Payer: Self-pay

## 2016-11-30 DIAGNOSIS — I10 Essential (primary) hypertension: Secondary | ICD-10-CM | POA: Diagnosis not present

## 2016-11-30 DIAGNOSIS — I482 Chronic atrial fibrillation, unspecified: Secondary | ICD-10-CM

## 2016-11-30 DIAGNOSIS — E785 Hyperlipidemia, unspecified: Secondary | ICD-10-CM | POA: Diagnosis not present

## 2016-11-30 DIAGNOSIS — I340 Nonrheumatic mitral (valve) insufficiency: Secondary | ICD-10-CM

## 2016-11-30 DIAGNOSIS — I341 Nonrheumatic mitral (valve) prolapse: Secondary | ICD-10-CM | POA: Diagnosis not present

## 2016-11-30 DIAGNOSIS — I270 Primary pulmonary hypertension: Secondary | ICD-10-CM | POA: Diagnosis not present

## 2016-11-30 DIAGNOSIS — I5032 Chronic diastolic (congestive) heart failure: Secondary | ICD-10-CM | POA: Insufficient documentation

## 2016-11-30 DIAGNOSIS — I071 Rheumatic tricuspid insufficiency: Secondary | ICD-10-CM | POA: Insufficient documentation

## 2016-11-30 DIAGNOSIS — I110 Hypertensive heart disease with heart failure: Secondary | ICD-10-CM | POA: Diagnosis not present

## 2016-11-30 LAB — ECHOCARDIOGRAM COMPLETE
AOASC: 36 cm
AVLVOTPG: 1 mmHg
CHL CUP DOP CALC LVOT VTI: 10.5 cm
CHL CUP MV DEC (S): 165
EWDT: 165 ms
FS: 33 % (ref 28–44)
IV/PV OW: 0.8
LA ID, A-P, ES: 48 mm
LA diam index: 2.34 cm/m2
LAVOLA4C: 138 mL
LDCA: 2.84 cm2
LEFT ATRIUM END SYS DIAM: 48 mm
LV PW d: 13.1 mm — AB (ref 0.6–1.1)
LVOT SV: 30 mL
LVOT diameter: 19 mm
LVOTPV: 59.2 cm/s
MV Peak grad: 7 mmHg
MV pk E vel: 129 m/s
RV sys press: 38 mmHg
Reg peak vel: 276 cm/s
TR max vel: 276 cm/s

## 2016-12-03 DIAGNOSIS — M5431 Sciatica, right side: Secondary | ICD-10-CM | POA: Diagnosis not present

## 2016-12-03 DIAGNOSIS — M9904 Segmental and somatic dysfunction of sacral region: Secondary | ICD-10-CM | POA: Diagnosis not present

## 2016-12-03 DIAGNOSIS — M5137 Other intervertebral disc degeneration, lumbosacral region: Secondary | ICD-10-CM | POA: Diagnosis not present

## 2016-12-03 DIAGNOSIS — M9905 Segmental and somatic dysfunction of pelvic region: Secondary | ICD-10-CM | POA: Diagnosis not present

## 2016-12-03 DIAGNOSIS — Q72891 Other reduction defects of right lower limb: Secondary | ICD-10-CM | POA: Diagnosis not present

## 2016-12-03 DIAGNOSIS — M5432 Sciatica, left side: Secondary | ICD-10-CM | POA: Diagnosis not present

## 2016-12-03 DIAGNOSIS — M9903 Segmental and somatic dysfunction of lumbar region: Secondary | ICD-10-CM | POA: Diagnosis not present

## 2016-12-04 DIAGNOSIS — M9903 Segmental and somatic dysfunction of lumbar region: Secondary | ICD-10-CM | POA: Diagnosis not present

## 2016-12-04 DIAGNOSIS — M9905 Segmental and somatic dysfunction of pelvic region: Secondary | ICD-10-CM | POA: Diagnosis not present

## 2016-12-04 DIAGNOSIS — M5432 Sciatica, left side: Secondary | ICD-10-CM | POA: Diagnosis not present

## 2016-12-04 DIAGNOSIS — M9904 Segmental and somatic dysfunction of sacral region: Secondary | ICD-10-CM | POA: Diagnosis not present

## 2016-12-04 DIAGNOSIS — Q72891 Other reduction defects of right lower limb: Secondary | ICD-10-CM | POA: Diagnosis not present

## 2016-12-04 DIAGNOSIS — M5431 Sciatica, right side: Secondary | ICD-10-CM | POA: Diagnosis not present

## 2016-12-04 DIAGNOSIS — M5137 Other intervertebral disc degeneration, lumbosacral region: Secondary | ICD-10-CM | POA: Diagnosis not present

## 2016-12-06 DIAGNOSIS — M5431 Sciatica, right side: Secondary | ICD-10-CM | POA: Diagnosis not present

## 2016-12-06 DIAGNOSIS — H26492 Other secondary cataract, left eye: Secondary | ICD-10-CM | POA: Diagnosis not present

## 2016-12-06 DIAGNOSIS — M5137 Other intervertebral disc degeneration, lumbosacral region: Secondary | ICD-10-CM | POA: Diagnosis not present

## 2016-12-06 DIAGNOSIS — M5432 Sciatica, left side: Secondary | ICD-10-CM | POA: Diagnosis not present

## 2016-12-06 DIAGNOSIS — Q72891 Other reduction defects of right lower limb: Secondary | ICD-10-CM | POA: Diagnosis not present

## 2016-12-06 DIAGNOSIS — M9903 Segmental and somatic dysfunction of lumbar region: Secondary | ICD-10-CM | POA: Diagnosis not present

## 2016-12-06 DIAGNOSIS — M9904 Segmental and somatic dysfunction of sacral region: Secondary | ICD-10-CM | POA: Diagnosis not present

## 2016-12-06 DIAGNOSIS — M9905 Segmental and somatic dysfunction of pelvic region: Secondary | ICD-10-CM | POA: Diagnosis not present

## 2016-12-10 DIAGNOSIS — M5431 Sciatica, right side: Secondary | ICD-10-CM | POA: Diagnosis not present

## 2016-12-10 DIAGNOSIS — M9904 Segmental and somatic dysfunction of sacral region: Secondary | ICD-10-CM | POA: Diagnosis not present

## 2016-12-10 DIAGNOSIS — M5432 Sciatica, left side: Secondary | ICD-10-CM | POA: Diagnosis not present

## 2016-12-10 DIAGNOSIS — M9903 Segmental and somatic dysfunction of lumbar region: Secondary | ICD-10-CM | POA: Diagnosis not present

## 2016-12-10 DIAGNOSIS — M9905 Segmental and somatic dysfunction of pelvic region: Secondary | ICD-10-CM | POA: Diagnosis not present

## 2016-12-10 DIAGNOSIS — M5137 Other intervertebral disc degeneration, lumbosacral region: Secondary | ICD-10-CM | POA: Diagnosis not present

## 2016-12-10 DIAGNOSIS — Q72891 Other reduction defects of right lower limb: Secondary | ICD-10-CM | POA: Diagnosis not present

## 2016-12-11 DIAGNOSIS — M5137 Other intervertebral disc degeneration, lumbosacral region: Secondary | ICD-10-CM | POA: Diagnosis not present

## 2016-12-11 DIAGNOSIS — M5432 Sciatica, left side: Secondary | ICD-10-CM | POA: Diagnosis not present

## 2016-12-11 DIAGNOSIS — M9903 Segmental and somatic dysfunction of lumbar region: Secondary | ICD-10-CM | POA: Diagnosis not present

## 2016-12-11 DIAGNOSIS — M5431 Sciatica, right side: Secondary | ICD-10-CM | POA: Diagnosis not present

## 2016-12-11 DIAGNOSIS — M9904 Segmental and somatic dysfunction of sacral region: Secondary | ICD-10-CM | POA: Diagnosis not present

## 2016-12-11 DIAGNOSIS — M9905 Segmental and somatic dysfunction of pelvic region: Secondary | ICD-10-CM | POA: Diagnosis not present

## 2016-12-11 DIAGNOSIS — Q72891 Other reduction defects of right lower limb: Secondary | ICD-10-CM | POA: Diagnosis not present

## 2016-12-13 DIAGNOSIS — M9905 Segmental and somatic dysfunction of pelvic region: Secondary | ICD-10-CM | POA: Diagnosis not present

## 2016-12-13 DIAGNOSIS — M9904 Segmental and somatic dysfunction of sacral region: Secondary | ICD-10-CM | POA: Diagnosis not present

## 2016-12-13 DIAGNOSIS — M9903 Segmental and somatic dysfunction of lumbar region: Secondary | ICD-10-CM | POA: Diagnosis not present

## 2016-12-13 DIAGNOSIS — M5431 Sciatica, right side: Secondary | ICD-10-CM | POA: Diagnosis not present

## 2016-12-13 DIAGNOSIS — Q72891 Other reduction defects of right lower limb: Secondary | ICD-10-CM | POA: Diagnosis not present

## 2016-12-13 DIAGNOSIS — M5137 Other intervertebral disc degeneration, lumbosacral region: Secondary | ICD-10-CM | POA: Diagnosis not present

## 2016-12-13 DIAGNOSIS — M5432 Sciatica, left side: Secondary | ICD-10-CM | POA: Diagnosis not present

## 2016-12-17 DIAGNOSIS — M9903 Segmental and somatic dysfunction of lumbar region: Secondary | ICD-10-CM | POA: Diagnosis not present

## 2016-12-17 DIAGNOSIS — M5431 Sciatica, right side: Secondary | ICD-10-CM | POA: Diagnosis not present

## 2016-12-17 DIAGNOSIS — Q72891 Other reduction defects of right lower limb: Secondary | ICD-10-CM | POA: Diagnosis not present

## 2016-12-17 DIAGNOSIS — M9905 Segmental and somatic dysfunction of pelvic region: Secondary | ICD-10-CM | POA: Diagnosis not present

## 2016-12-17 DIAGNOSIS — M5137 Other intervertebral disc degeneration, lumbosacral region: Secondary | ICD-10-CM | POA: Diagnosis not present

## 2016-12-17 DIAGNOSIS — M9904 Segmental and somatic dysfunction of sacral region: Secondary | ICD-10-CM | POA: Diagnosis not present

## 2016-12-17 DIAGNOSIS — M5432 Sciatica, left side: Secondary | ICD-10-CM | POA: Diagnosis not present

## 2016-12-18 DIAGNOSIS — Q72891 Other reduction defects of right lower limb: Secondary | ICD-10-CM | POA: Diagnosis not present

## 2016-12-18 DIAGNOSIS — M5432 Sciatica, left side: Secondary | ICD-10-CM | POA: Diagnosis not present

## 2016-12-18 DIAGNOSIS — M9903 Segmental and somatic dysfunction of lumbar region: Secondary | ICD-10-CM | POA: Diagnosis not present

## 2016-12-18 DIAGNOSIS — M9905 Segmental and somatic dysfunction of pelvic region: Secondary | ICD-10-CM | POA: Diagnosis not present

## 2016-12-18 DIAGNOSIS — M9904 Segmental and somatic dysfunction of sacral region: Secondary | ICD-10-CM | POA: Diagnosis not present

## 2016-12-18 DIAGNOSIS — M5137 Other intervertebral disc degeneration, lumbosacral region: Secondary | ICD-10-CM | POA: Diagnosis not present

## 2016-12-18 DIAGNOSIS — M5431 Sciatica, right side: Secondary | ICD-10-CM | POA: Diagnosis not present

## 2016-12-25 DIAGNOSIS — M9903 Segmental and somatic dysfunction of lumbar region: Secondary | ICD-10-CM | POA: Diagnosis not present

## 2016-12-25 DIAGNOSIS — M9905 Segmental and somatic dysfunction of pelvic region: Secondary | ICD-10-CM | POA: Diagnosis not present

## 2016-12-25 DIAGNOSIS — M9904 Segmental and somatic dysfunction of sacral region: Secondary | ICD-10-CM | POA: Diagnosis not present

## 2016-12-25 DIAGNOSIS — Q72891 Other reduction defects of right lower limb: Secondary | ICD-10-CM | POA: Diagnosis not present

## 2016-12-25 DIAGNOSIS — M5137 Other intervertebral disc degeneration, lumbosacral region: Secondary | ICD-10-CM | POA: Diagnosis not present

## 2016-12-25 DIAGNOSIS — M5432 Sciatica, left side: Secondary | ICD-10-CM | POA: Diagnosis not present

## 2016-12-25 DIAGNOSIS — M5431 Sciatica, right side: Secondary | ICD-10-CM | POA: Diagnosis not present

## 2016-12-27 DIAGNOSIS — M9905 Segmental and somatic dysfunction of pelvic region: Secondary | ICD-10-CM | POA: Diagnosis not present

## 2016-12-27 DIAGNOSIS — Q72891 Other reduction defects of right lower limb: Secondary | ICD-10-CM | POA: Diagnosis not present

## 2016-12-27 DIAGNOSIS — M5432 Sciatica, left side: Secondary | ICD-10-CM | POA: Diagnosis not present

## 2016-12-27 DIAGNOSIS — M5431 Sciatica, right side: Secondary | ICD-10-CM | POA: Diagnosis not present

## 2016-12-27 DIAGNOSIS — M5137 Other intervertebral disc degeneration, lumbosacral region: Secondary | ICD-10-CM | POA: Diagnosis not present

## 2016-12-27 DIAGNOSIS — M9904 Segmental and somatic dysfunction of sacral region: Secondary | ICD-10-CM | POA: Diagnosis not present

## 2016-12-27 DIAGNOSIS — M9903 Segmental and somatic dysfunction of lumbar region: Secondary | ICD-10-CM | POA: Diagnosis not present

## 2017-01-10 DIAGNOSIS — H26492 Other secondary cataract, left eye: Secondary | ICD-10-CM | POA: Diagnosis not present

## 2017-02-28 DIAGNOSIS — M48062 Spinal stenosis, lumbar region with neurogenic claudication: Secondary | ICD-10-CM | POA: Diagnosis not present

## 2017-03-05 DIAGNOSIS — Z1389 Encounter for screening for other disorder: Secondary | ICD-10-CM | POA: Diagnosis not present

## 2017-03-05 DIAGNOSIS — Z9181 History of falling: Secondary | ICD-10-CM | POA: Diagnosis not present

## 2017-03-05 DIAGNOSIS — Z125 Encounter for screening for malignant neoplasm of prostate: Secondary | ICD-10-CM | POA: Diagnosis not present

## 2017-03-05 DIAGNOSIS — Z136 Encounter for screening for cardiovascular disorders: Secondary | ICD-10-CM | POA: Diagnosis not present

## 2017-03-05 DIAGNOSIS — Z6832 Body mass index (BMI) 32.0-32.9, adult: Secondary | ICD-10-CM | POA: Diagnosis not present

## 2017-03-05 DIAGNOSIS — E669 Obesity, unspecified: Secondary | ICD-10-CM | POA: Diagnosis not present

## 2017-03-05 DIAGNOSIS — E785 Hyperlipidemia, unspecified: Secondary | ICD-10-CM | POA: Diagnosis not present

## 2017-03-05 DIAGNOSIS — Z0000 Encounter for general adult medical examination without abnormal findings: Secondary | ICD-10-CM | POA: Diagnosis not present

## 2017-03-19 DIAGNOSIS — Z6833 Body mass index (BMI) 33.0-33.9, adult: Secondary | ICD-10-CM | POA: Diagnosis not present

## 2017-03-19 DIAGNOSIS — Z23 Encounter for immunization: Secondary | ICD-10-CM | POA: Diagnosis not present

## 2017-03-19 DIAGNOSIS — M109 Gout, unspecified: Secondary | ICD-10-CM | POA: Diagnosis not present

## 2017-03-19 DIAGNOSIS — Z125 Encounter for screening for malignant neoplasm of prostate: Secondary | ICD-10-CM | POA: Diagnosis not present

## 2017-03-19 DIAGNOSIS — I10 Essential (primary) hypertension: Secondary | ICD-10-CM | POA: Diagnosis not present

## 2017-03-19 DIAGNOSIS — I4891 Unspecified atrial fibrillation: Secondary | ICD-10-CM | POA: Diagnosis not present

## 2017-03-19 DIAGNOSIS — Z79899 Other long term (current) drug therapy: Secondary | ICD-10-CM | POA: Diagnosis not present

## 2017-03-19 DIAGNOSIS — E7800 Pure hypercholesterolemia, unspecified: Secondary | ICD-10-CM | POA: Diagnosis not present

## 2017-03-19 DIAGNOSIS — I776 Arteritis, unspecified: Secondary | ICD-10-CM | POA: Diagnosis not present

## 2017-03-19 DIAGNOSIS — I5032 Chronic diastolic (congestive) heart failure: Secondary | ICD-10-CM | POA: Diagnosis not present

## 2017-03-25 DIAGNOSIS — Z86010 Personal history of colonic polyps: Secondary | ICD-10-CM | POA: Diagnosis not present

## 2017-03-25 DIAGNOSIS — D126 Benign neoplasm of colon, unspecified: Secondary | ICD-10-CM | POA: Diagnosis not present

## 2017-03-25 DIAGNOSIS — Z980 Intestinal bypass and anastomosis status: Secondary | ICD-10-CM | POA: Diagnosis not present

## 2017-03-25 DIAGNOSIS — K648 Other hemorrhoids: Secondary | ICD-10-CM | POA: Diagnosis not present

## 2017-03-28 DIAGNOSIS — D126 Benign neoplasm of colon, unspecified: Secondary | ICD-10-CM | POA: Diagnosis not present

## 2017-05-02 DIAGNOSIS — M48062 Spinal stenosis, lumbar region with neurogenic claudication: Secondary | ICD-10-CM | POA: Diagnosis not present

## 2017-05-06 DIAGNOSIS — M310 Hypersensitivity angiitis: Secondary | ICD-10-CM | POA: Insufficient documentation

## 2017-05-06 DIAGNOSIS — D0460 Carcinoma in situ of skin of unspecified upper limb, including shoulder: Secondary | ICD-10-CM | POA: Insufficient documentation

## 2017-05-06 DIAGNOSIS — T8859XA Other complications of anesthesia, initial encounter: Secondary | ICD-10-CM | POA: Insufficient documentation

## 2017-05-06 DIAGNOSIS — M4800 Spinal stenosis, site unspecified: Secondary | ICD-10-CM | POA: Insufficient documentation

## 2017-05-06 DIAGNOSIS — I482 Chronic atrial fibrillation, unspecified: Secondary | ICD-10-CM | POA: Insufficient documentation

## 2017-05-06 DIAGNOSIS — T4145XA Adverse effect of unspecified anesthetic, initial encounter: Secondary | ICD-10-CM | POA: Insufficient documentation

## 2017-05-06 DIAGNOSIS — E7800 Pure hypercholesterolemia, unspecified: Secondary | ICD-10-CM | POA: Insufficient documentation

## 2017-05-14 NOTE — Progress Notes (Signed)
Cardiology Office Note:    Date:  05/17/2017   ID:  Jacob Shannon, DOB 1940-11-01, MRN [id number]  PCP:  Cyndi Bender, PA-C  Cardiologist:  Dr. Adreanna Fickel [place]     No chief complaint on file.   History of Present Illness:     Jacob Shannon is a 76 y.o. male with a hx of chronic atrial fibrillation, HTN, HL and MVP with MR.  Seen for follow up MR. Echocardiogram done in 8/16 at [facility] with normal EF, severe left atrial enlargement, moderate MR and mild to moderate TR with RVSP 49 mmHg. He has been evaluated by pulmonology.  Pulmonary hypertension is felt to be secondary to diastolic dysfunction and possibly mitral regurgitation. He has been noted to have bilateral pleural effusions in the past as well as some degree of mediastinal adenopathy. 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 Systemic or pulmonary process was not felt to be active.  Admitted 2/5-07/25/16 with lightheadedness associated with dyspnea, abdominal discomfort and nausea without emesis.  He was evaluated by cardiology. Cardiac enzymes were minimally elevated without clear trend.  Rate controlling medications were adjusted secondary to slow ventricular response. Digoxin was discontinued and metoprolol dose was decreased. Myoview was obtained as an inpatient. This demonstrated no ischemia and normal EF. Echocardiogram did demonstrate normal LV function with prolapse of anterior mitral valve leaflet and moderate to severe mitral regurgitation as well as massive biatrial enlargement and moderate pulmonary hypertension. At the time he was well compensated so medical therapy was continued.   In March he developed progressive CHF symptoms. He underwent TEE which showed moderate MR with eccentric jet. Right heart cath showed normal pulmonary and LV filling pressures. LV function was normal. No significant CAD. He was managed medically.  On follow up today he complains of a recent head cold that he just can't  shake. Noted some hemoptysis in the early morning but otherwise not much of a cough. No chest pain.  Still has chronic swelling in right leg and wear a compression sleeve.  He does note some  Dyspnea on exertion and he is hypoxic today.     Past Medical History:  Diagnosis Date  . Chronic atrial fibrillation ([place])   . Complication of anesthesia    pt states that  "i'm hard to wake up"  . Diastolic heart failure ([place])   . Essential hypertension   . Gout   . Hypercholesterolemia   . Leukocytoclastic vasculitis ([place])   . Mitral regurgitation    Moderate August 2016  . Secondary pulmonary hypertension    RVSP 49 mmHg August 2016  . Spinal stenosis   . 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 CARDIAC CATHETERIZATION N/A 08/26/2015   Procedure: Right/Left Heart Cath and Coronary Angiography;  Surgeon: Lynnleigh Soden M [place], MD;  Location: [place] [facility];  Service: Cardiovascular;  Laterality: N/A;  . CATARACT EXTRACTION Bilateral   . COLONOSCOPY    . LEG SKIN LESION  BIOPSY / EXCISION Right 2010  . TEE WITHOUT CARDIOVERSION N/A 08/25/2015   Procedure: TRANSESOPHAGEAL ECHOCARDIOGRAM (TEE);  Surgeon: Sanda Klein, MD;  Location: [facility] ENDOSCOPY;  Service: Cardiovascular;  Laterality: N/A;    Current Medications: Outpatient Medications Prior to Visit  Medication Sig Dispense Refill  . amLODipine (NORVASC) 5 MG tablet Take 2.5 mg by mouth daily.    . colchicine 0.6 MG tablet Take 0.6  mg by mouth daily as needed (gout attack).     . dabigatran (PRADAXA) 150 MG CAPS capsule Take 150 mg by mouth 2 (two) times daily.    Marland Kitchen losartan (COZAAR) 100 MG tablet Take 100 mg by mouth daily.     . metoprolol succinate (TOPROL-XL) 25 MG 24 hr tablet Take 1 tablet (25 mg total) by mouth 3 (three) times daily. PLEASE CONTACT OFFICE FOR ADDITIONAL REFILLS 90 tablet 0  . potassium chloride SA (K-DUR,KLOR-CON) 20 MEQ tablet Take 20 mEq  by mouth 2 (two) times daily.    Marland Kitchen torsemide (DEMADEX) 20 MG tablet Take 40 mg ( 2 tablets ) in am and 20 mg ( 2  tablet ) in pm 60 tablet 3  . fluticasone (FLONASE) 50 MCG/ACT nasal spray Place 1 spray into both nostrils daily as needed (nasal drip).     . naproxen sodium (ALEVE) 220 MG tablet Take 440 mg by mouth daily.    . potassium chloride SA (K-DUR,KLOR-CON) 20 MEQ tablet Take 1 tablet (20 mEq total) by mouth daily. (Patient not taking: Reported on 05/17/2017) 90 tablet 0   No facility-administered medications prior to visit.      Allergies:   Patient has no known allergies.   Social History   Socioeconomic History  . Marital status: Married    Spouse name: None  . Number of children: 2  . Years of education: None  . Highest education level: None  Social Needs  . Financial resource strain: None  . Food insecurity - worry: None  . Food insecurity - inability: None  . Transportation needs - medical: None  . Transportation needs - non-medical: None  Occupational History  . Occupation: self employed     Comment: [employer]  Tobacco Use  . Smoking status: Former Smoker    Packs/day: 1.50    Years: 38.00    Pack years: 57.00    Types: Cigarettes    Start date: 06/18/1956    Last attempt to quit: 06/18/1994    Years since quitting: 22.9  . Smokeless tobacco: Never Used  Substance and Sexual Activity  . Alcohol use: Yes    Alcohol/week: 0.0 oz    Comment: Beer on a weekly basis.  . Drug use: No  . Sexual activity: None  Other Topics Concern  . None  Social History Narrative   Originally from [place]. Always lived in [place]. Prior travel to [place] [facility]. 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 No pets currently. No bird, asbestos, mold, or hot tub exposure. He enjoys fishing.      Family History:  The patient's family history includes Colon cancer in his brother; Coronary artery  disease in his brother and father; Heart attack in his father; Stomach cancer in his brother.   ROS:   Please see the history of present illness.    All other systems reviewed and are negative.   Physical Exam:    VS:  BP (!) 104/54 (BP Location: Right Arm, Cuff Size: Normal)   Pulse 90   Ht 5' 8.5" (1.74 m)   Wt 200 lb 12.8 oz (91.1 kg)   SpO2 (!) 88%   BMI 30.09 kg/m    GENERAL:  Well appearing WM in NAD HEENT:  PERRL, EOMI, sclera are clear. Oropharynx is clear. NECK:  No jugular venous distention, carotid upstroke brisk and symmetric, no  bruits, no thyromegaly or adenopathy LUNGS:  Clear to auscultation bilaterally CHEST:  Unremarkable HEART:  IRRR,  PMI not displaced or sustained,S1 and S2 within normal limits, no S3, no S4: no clicks, no rubs, gr 3/6 holosystolic murmur at the apex. ABD:  Soft, nontender. BS +, no masses or bruits. No hepatomegaly, no splenomegaly EXT:  2 + pulses throughout, no edema, no cyanosis no clubbing SKIN:  Warm and dry.  No rashes NEURO:  Alert and oriented x 3. Cranial nerves II through XII intact. PSYCH:  Cognitively intact    Wt Readings from Last 3 Encounters:  05/17/17 200 lb 12.8 oz (91.1 kg)  11/19/16 199 lb 3.2 oz (90.4 kg)  12/12/15 190 lb (86.2 kg)      Studies/Labs Reviewed:     EKG:  EKG is not ordered today.     Recent Labs: No results found for requested labs within last 8760 hours.   Recent Lipid Panel    Component Value Date/Time   CHOL 130 07/26/2015 0423   TRIG 87 07/26/2015 0423   HDL 34 (L) 07/26/2015 0423   CHOLHDL 3.8 07/26/2015 0423   VLDL 17 07/26/2015 0423   LDLCALC 79 07/26/2015 0423   Labs dated 09/17/16: cholesterol 118, triglycerides 94. HDL 35, LDL 64. CMET normal. Dated 03/19/17: cholesterol 152, triglycerides 113, HDL 46, LDL 83. Creatinine 1.35. Other chemistries normal. CBC normal.  Additional studies/ records that were reviewed today include:   Myoview 2/17 IMPRESSION: 1. Small fixed  perfusion defect in the distal lateral wall without wall motion abnormality, likely myocardial thinning. No evidence of pharmacologically induced myocardial ischemia. 2. Mild septal dyskinesis with otherwise normal left ventricular wall motion. 3. Left ventricular ejection fraction 62% 4. Low-risk stress test findings*.  Echo 2/17 Moderate LVH, EF 55-60%, normal wall motion, mild AI, anterior leaflet MVP, moderate to severe MR, masses BAE, mild TR, PASP 56 mmHg  Echo 11/30/16: Study Conclusions  - Left ventricle: The cavity size was normal. Wall thickness was   normal. Systolic function was vigorous. The estimated ejection   fraction was in the range of 65% to 70%. - Aortic valve: There was trivial regurgitation. - Mitral valve: MR is very eccentric directed posterior into LA   Probably moderate in severity DIfficult to accurately assess. - Left atrium: The atrium was severely dilated. - Right ventricle: The cavity size was mildly dilated. - Right atrium: The atrium was massively dilated. - Tricuspid valve: There was moderate regurgitation. - Pulmonary arteries: PA peak pressure: 45 mm Hg (S). - Pericardium, extracardiac: A trivial pericardial effusion was   identified.  Impressions:  - No significant change from report of 2017.exce[t that MR was   reported to be moderate to severe.  Right/Left Heart Cath and Coronary Angiography    Conclusion     Prox LAD lesion, 25% stenosed.  1. Minor nonobstructive CAD 2. Normal LV function. 3. Moderate mitral insufficiency. 4. Normal right heart pressures. 5. Normal LV filling pressures.   Plan: medical management. MR does not appear severe enough to warrant repair. Will switch lasix to po. May resume Pradaxa in am. Anticipate DC tomorrow.     Indications    Acute diastolic CHF (congestive heart failure) (HCC) [I50.31 (ICD-10-CM)]    Technique and Indications    Procedural Details: The right wrist was prepped, draped,  and anesthetized with 1% lidocaine. Using the modified Seldinger technique a 6 Fr slender sheath was placed in the right radial artery. Due to heavy calcification of the  right innominate artery we were able to pass a JR4 catheter but no other catheters would pass including a 4 Fr left catheter. We then completed the study using a femoral approach. The right groin was prepped and draped. The groin was anesthetized with 1% lidocaine. Using a modified Seldinger technique a 5 Fr sheath was placed in the right femoral artery and a 7 French sheath was placed in the right femoral vein. A Swan-Ganz catheter was used for the right heart catheterization. Standard protocol was followed for recording of right heart pressures and sampling of oxygen saturations. Fick cardiac output was calculated. Standard Judkins catheters were used for selective coronary angiography and left ventriculography. There were no immediate procedural complications. The patient was transferred to the post catheterization recovery area for further monitoring. Contrast: 70 cc  During this procedure the patient is administered a total of Versed 2 mg and Fentanyl 50 mg to achieve and maintain moderate conscious sedation. The patient's heart rate, blood pressure, and oxygen saturation are monitored continuously during the procedure. The period of conscious sedation is 45 minutes, of which I was present face-to-face 100% of this time.  Estimated blood loss <50 mL. There were no immediate complications during the procedure.    Coronary Findings    Dominance: Right   Left Main  Vessel was injected. Vessel is normal in caliber. Vessel is angiographically normal.     Left Anterior Descending   . Prox LAD lesion, 25% stenosed. Moderately Calcified.     Left Circumflex  Vessel was injected. Vessel is normal in caliber. Vessel is angiographically normal.     Right Coronary Artery  Vessel was injected. Vessel is normal in caliber. Vessel is  angiographically normal.       Right Heart Pressures Hemodynamic findings consistent with mitral valve regurgitation. LV EDP is normal. Normal right heart pressures.    Wall Motion                 Left Heart    Mitral Valve There is moderate (3+) mitral regurgitation.    Coronary Diagrams    Diagnostic Diagram             TEE 08/25/15:Study Conclusions  - Left ventricle: Systolic function was normal. The estimated  ejection fraction was in the range of 55% to 60%. The study is  not technically sufficient to allow evaluation of LV diastolic  function. - Aortic valve: No evidence of vegetation. There was mild  regurgitation. - Mitral valve: There was moderate (at most moderate-to-severe)  regurgitation directed eccentrically and posteriorly. - Left atrium: The atrium was severely dilated. No evidence of  thrombus in the atrial cavity or appendage. No spontaneous echo  contrast was observed. - Right atrium: The atrium was severely dilated. No evidence of  thrombus in the atrial cavity or appendage. - Atrial septum: No defect or patent foramen ovale was identified.  Echo contrast study showed no right-to-left atrial level shunt,  following an increase in RA pressure induced by provocative  maneuvers. - Tricuspid valve: No evidence of vegetation. No evidence of  vegetation. There was moderate regurgitation directed centrally. - Pulmonary arteries: PA peak pressure: 50 mm Hg (S).  Impressions:  - The severity of mitral insuficiency appeared dependent on the  severity of the elevation in systolic BP (very high at study  onset, normal by the time of study completion). The eccentric jet  and the presence of severe left atrial dilation may lead to  underestimation of MR severity.  In aggregate, however, the mitral  regurgitation does not appear to be severe. ASSESSMENT:     1. Chronic atrial fibrillation ([place])   2. Chronic diastolic CHF  (congestive heart failure), NYHA class 2 ([place])   3. Mitral valve prolapse   4. Essential hypertension   5. Hemoptysis     PLAN:      1. Chronic diastolic CHF. Weight is stable and edema is unchanged.   Will continue  demadex at current dose.   If CHF symptoms progress we will  need to consider MV repair.   2.Mitra Regurgitation - Moderate to severe by last Echo and TEE with prolapse of the anterior leaflet. Normal right heart pressures at the time of cath. Repeat Echo in June 2018 was unchanged. I would have a low threshold for recommending MV repair if symptoms worsen.   3. Chronic AFib - Rate controlled now. Continue metoprolol. On Pradaxa for anticoagulation.   4. HTN - BP is low today. He also is hypoxic with sat 88%. This is very unusual for him. Will check labs including CBC and BMET. Check CXR today. I have recommended reduction in amlodipine to 2.5 mg daiily.  5. Pulmonary HTN- normal pressures by cardiac cath.   6. Gout.    Medication Adjustments/Labs and Tests Ordered: Above plan discussed at length with patient and his son. Plan hospital admission today.   Patient Instructions  Reduce amlodipine to 2.5 mg daily  We will check a Chest X ray today  Will check blood work       Signed, Desi Carby [place], MD  05/17/2017 10:06 AM    [address], [place], [place]  [address] Phone: [phone]; Fax: [phone]

## 2017-05-17 ENCOUNTER — Ambulatory Visit
Admission: RE | Admit: 2017-05-17 | Discharge: 2017-05-17 | Disposition: A | Discharge status: Home / Self Care | Payer: Medicare Other | Source: Ambulatory Visit | Attending: Cardiology | Admitting: Cardiology

## 2017-05-17 ENCOUNTER — Encounter: Payer: Self-pay | Admitting: Cardiology

## 2017-05-17 ENCOUNTER — Other Ambulatory Visit: Payer: Self-pay

## 2017-05-17 ENCOUNTER — Ambulatory Visit (INDEPENDENT_AMBULATORY_CARE_PROVIDER_SITE_OTHER): Payer: Medicare Other | Admitting: Cardiology

## 2017-05-17 VITALS — BP 104/54 | HR 90 | Ht 68.5 in | Wt 200.8 lb

## 2017-05-17 DIAGNOSIS — I10 Essential (primary) hypertension: Secondary | ICD-10-CM

## 2017-05-17 DIAGNOSIS — R05 Cough: Secondary | ICD-10-CM | POA: Diagnosis not present

## 2017-05-17 DIAGNOSIS — I5032 Chronic diastolic (congestive) heart failure: Secondary | ICD-10-CM

## 2017-05-17 DIAGNOSIS — I341 Nonrheumatic mitral (valve) prolapse: Secondary | ICD-10-CM

## 2017-05-17 DIAGNOSIS — R042 Hemoptysis: Secondary | ICD-10-CM

## 2017-05-17 DIAGNOSIS — I482 Chronic atrial fibrillation, unspecified: Secondary | ICD-10-CM

## 2017-05-17 DIAGNOSIS — J189 Pneumonia, unspecified organism: Secondary | ICD-10-CM

## 2017-05-17 DIAGNOSIS — J181 Lobar pneumonia, unspecified organism: Principal | ICD-10-CM

## 2017-05-17 LAB — BASIC METABOLIC PANEL
BUN/Creatinine Ratio: 20 (ref 10–24)
BUN: 24 mg/dL (ref 8–27)
CO2: 23 mmol/L (ref 20–29)
CREATININE: 1.21 mg/dL (ref 0.76–1.27)
Calcium: 8.7 mg/dL (ref 8.6–10.2)
Chloride: 99 mmol/L (ref 96–106)
GFR calc Af Amer: 66 mL/min/{1.73_m2} (ref >59)
GFR calc non Af Amer: 57 mL/min/{1.73_m2} — ABNORMAL LOW (ref >59)
Glucose: 114 mg/dL — ABNORMAL HIGH (ref 65–99)
POTASSIUM: 3.4 mmol/L — AB (ref 3.5–5.2)
SODIUM: 140 mmol/L (ref 134–144)

## 2017-05-17 LAB — CBC WITH DIFFERENTIAL/PLATELET
Basophils Absolute: 0 10*3/uL (ref 0.0–0.2)
Basos: 0 %
EOS (ABSOLUTE): 0.2 10*3/uL (ref 0.0–0.4)
Eos: 3 %
Hematocrit: 34.5 % — ABNORMAL LOW (ref 37.5–51.0)
Hemoglobin: 11.5 g/dL — ABNORMAL LOW (ref 13.0–17.7)
IMMATURE GRANULOCYTES: 1 %
Immature Grans (Abs): 0 10*3/uL (ref 0.0–0.1)
Lymphocytes Absolute: 1 10*3/uL (ref 0.7–3.1)
Lymphs: 15 %
MCH: 32.6 pg (ref 26.6–33.0)
MCHC: 33.3 g/dL (ref 31.5–35.7)
MCV: 98 fL — ABNORMAL HIGH (ref 79–97)
MONOS ABS: 0.8 10*3/uL (ref 0.1–0.9)
Monocytes: 12 %
NEUTROS PCT: 69 %
Neutrophils Absolute: 5 10*3/uL (ref 1.4–7.0)
PLATELETS: 217 10*3/uL (ref 150–379)
RBC: 3.53 x10E6/uL — AB (ref 4.14–5.80)
RDW: 14.9 % (ref 12.3–15.4)
WBC: 7.1 10*3/uL (ref 3.4–10.8)

## 2017-05-17 MED ORDER — DOXYCYCLINE HYCLATE 100 MG PO CAPS: ORAL_CAPSULE | ORAL | 0 refills | Status: DC

## 2017-05-17 NOTE — Patient Instructions (Signed)
Reduce amlodipine to 2.5 mg daily  We will check a Chest X ray today  Will check blood work

## 2017-05-24 DIAGNOSIS — Z6830 Body mass index (BMI) 30.0-30.9, adult: Secondary | ICD-10-CM | POA: Diagnosis not present

## 2017-05-24 DIAGNOSIS — I9589 Other hypotension: Secondary | ICD-10-CM | POA: Diagnosis not present

## 2017-05-28 DIAGNOSIS — I9589 Other hypotension: Secondary | ICD-10-CM | POA: Diagnosis not present

## 2017-05-28 DIAGNOSIS — Z6831 Body mass index (BMI) 31.0-31.9, adult: Secondary | ICD-10-CM | POA: Diagnosis not present

## 2017-05-31 ENCOUNTER — Ambulatory Visit
Admission: RE | Admit: 2017-05-31 | Discharge: 2017-05-31 | Disposition: A | Discharge status: Home / Self Care | Payer: Medicare Other | Source: Ambulatory Visit | Attending: Cardiology | Admitting: Cardiology

## 2017-05-31 DIAGNOSIS — J189 Pneumonia, unspecified organism: Secondary | ICD-10-CM

## 2017-05-31 DIAGNOSIS — J181 Lobar pneumonia, unspecified organism: Principal | ICD-10-CM

## 2017-06-04 ENCOUNTER — Telehealth: Payer: Self-pay | Admitting: Cardiology

## 2017-06-04 NOTE — Telephone Encounter (Signed)
Notes recorded by [place], Peter M, MD on 05/31/2017 at 12:27 PM EST Pneumonia is improving  Returned call to pt notified of message above. He states not to call home number-do not give any results to his wife as she has alzheimer's. Please call [phone] only for pt.

## 2017-06-04 NOTE — Telephone Encounter (Signed)
New message  Pt verbalized that he is returning call for the rn  Call before 3pm

## 2017-06-28 DIAGNOSIS — I5032 Chronic diastolic (congestive) heart failure: Secondary | ICD-10-CM | POA: Diagnosis not present

## 2017-06-28 DIAGNOSIS — I10 Essential (primary) hypertension: Secondary | ICD-10-CM | POA: Diagnosis not present

## 2017-06-28 DIAGNOSIS — Z6831 Body mass index (BMI) 31.0-31.9, adult: Secondary | ICD-10-CM | POA: Diagnosis not present

## 2017-07-18 DIAGNOSIS — Z6832 Body mass index (BMI) 32.0-32.9, adult: Secondary | ICD-10-CM | POA: Diagnosis not present

## 2017-07-18 DIAGNOSIS — J189 Pneumonia, unspecified organism: Secondary | ICD-10-CM | POA: Diagnosis not present

## 2017-07-18 DIAGNOSIS — I959 Hypotension, unspecified: Secondary | ICD-10-CM | POA: Diagnosis not present

## 2017-07-18 DIAGNOSIS — I5032 Chronic diastolic (congestive) heart failure: Secondary | ICD-10-CM | POA: Diagnosis not present

## 2017-07-18 DIAGNOSIS — I4891 Unspecified atrial fibrillation: Secondary | ICD-10-CM | POA: Diagnosis not present

## 2017-07-26 DIAGNOSIS — R609 Edema, unspecified: Secondary | ICD-10-CM | POA: Diagnosis not present

## 2017-07-26 DIAGNOSIS — Z6832 Body mass index (BMI) 32.0-32.9, adult: Secondary | ICD-10-CM | POA: Diagnosis not present

## 2017-07-26 DIAGNOSIS — I517 Cardiomegaly: Secondary | ICD-10-CM | POA: Diagnosis not present

## 2017-07-26 DIAGNOSIS — I5032 Chronic diastolic (congestive) heart failure: Secondary | ICD-10-CM | POA: Diagnosis not present

## 2017-07-26 DIAGNOSIS — J449 Chronic obstructive pulmonary disease, unspecified: Secondary | ICD-10-CM | POA: Diagnosis not present

## 2017-07-26 DIAGNOSIS — J189 Pneumonia, unspecified organism: Secondary | ICD-10-CM | POA: Diagnosis not present

## 2017-07-26 DIAGNOSIS — J90 Pleural effusion, not elsewhere classified: Secondary | ICD-10-CM | POA: Diagnosis not present

## 2017-07-26 DIAGNOSIS — I10 Essential (primary) hypertension: Secondary | ICD-10-CM | POA: Diagnosis not present

## 2017-08-02 DIAGNOSIS — Z79899 Other long term (current) drug therapy: Secondary | ICD-10-CM | POA: Diagnosis not present

## 2017-08-02 DIAGNOSIS — Z6831 Body mass index (BMI) 31.0-31.9, adult: Secondary | ICD-10-CM | POA: Diagnosis not present

## 2017-08-02 DIAGNOSIS — J189 Pneumonia, unspecified organism: Secondary | ICD-10-CM | POA: Diagnosis not present

## 2017-08-02 DIAGNOSIS — I5032 Chronic diastolic (congestive) heart failure: Secondary | ICD-10-CM | POA: Diagnosis not present

## 2017-08-02 DIAGNOSIS — I10 Essential (primary) hypertension: Secondary | ICD-10-CM | POA: Diagnosis not present

## 2017-08-15 DIAGNOSIS — M48062 Spinal stenosis, lumbar region with neurogenic claudication: Secondary | ICD-10-CM | POA: Diagnosis not present

## 2017-09-18 DIAGNOSIS — M48062 Spinal stenosis, lumbar region with neurogenic claudication: Secondary | ICD-10-CM | POA: Diagnosis not present

## 2017-10-31 NOTE — Progress Notes (Signed)
Cardiology Office Note:    Date:  11/05/2017   ID:  [name], DOB 1941/03/06, MRN [id number]  PCP:  Cyndi Bender, PA-C  Cardiologist:  Dr. Mccauley Diehl [place]     Chief Complaint  Patient presents with  . Follow-up    pt complains of dizziness, denies chest pains, SOB, swelling in hands/feet noted    History of Present Illness:     Jacob Shannon is a 77 y.o. male with a hx of chronic atrial fibrillation, HTN, HL and MVP with MR.  Seen for follow up MR. Echocardiogram done in 8/16 at [facility] with normal EF, severe left atrial enlargement, moderate MR and mild to moderate TR with RVSP 49 mmHg. He has been evaluated by pulmonology.  Pulmonary hypertension is felt to be secondary to diastolic dysfunction and  mitral regurgitation. He has been noted to have bilateral pleural effusions in the past as well as some degree of mediastinal adenopathy. 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 Systemic or pulmonary process was not felt to be active.  Admitted 2/5-07/25/16 with lightheadedness associated with dyspnea, abdominal discomfort and nausea without emesis.  He was evaluated by cardiology. Cardiac enzymes were minimally elevated without clear trend.  Rate controlling medications were adjusted secondary to slow ventricular response. Digoxin was discontinued and metoprolol dose was decreased. Myoview was obtained as an inpatient. This demonstrated no ischemia and normal EF. Echocardiogram did demonstrate normal LV function with prolapse of anterior mitral valve leaflet and moderate to severe mitral regurgitation as well as massive biatrial enlargement and moderate pulmonary hypertension. At the time he was well compensated so medical therapy was continued.   In March 2017 he developed progressive CHF symptoms. He underwent TEE which showed moderate MR with eccentric jet. Right heart cath showed normal pulmonary and LV filling pressures. LV function was normal. No significant  CAD. He was managed medically. Most recent Echo in June 2018 showed no change.  On follow up today he notes that Cyndi Bender PA-C discontinued his losartan and amlodipine. BP has been well controlled. He notes some dizziness when he walks. Has persistent LE edema despite increased lasix dose. Wears support hose. Denies any dyspnea, PND, orthopnea. Weight is down.    Past Medical History:  Diagnosis Date  . Chronic atrial fibrillation ([place])   . Complication of anesthesia    pt states that  "i'm hard to wake up"  . Diastolic heart failure ([place])   . Essential hypertension   . Gout   . Hypercholesterolemia   . Leukocytoclastic vasculitis ([place])   . Mitral regurgitation    Moderate August 2016  . Secondary pulmonary hypertension    RVSP 49 mmHg August 2016  . Spinal stenosis   . 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 CARDIAC CATHETERIZATION N/A 08/26/2015   Procedure: Right/Left Heart Cath and Coronary Angiography;  Surgeon: Irina Okelly M [place], MD;  Location: [facility];  Service: Cardiovascular;  Laterality: N/A;  . CATARACT EXTRACTION Bilateral   . COLONOSCOPY    . LEG SKIN LESION  BIOPSY / EXCISION Right 2010  . TEE WITHOUT CARDIOVERSION N/A 08/25/2015   Procedure: TRANSESOPHAGEAL ECHOCARDIOGRAM (TEE);  Surgeon: Sanda Klein, MD;  Location: [facility] ENDOSCOPY;  Service: Cardiovascular;  Laterality: N/A;    Current Medications: Outpatient Medications Prior to Visit  Medication Sig Dispense Refill  . colchicine 0.6 MG tablet Take 0.6  mg by mouth daily as needed (gout attack).     . dabigatran (PRADAXA) 150 MG CAPS capsule Take 150 mg by mouth 2 (two) times daily.    . metoprolol succinate (TOPROL-XL) 25 MG 24 hr tablet Take 1 tablet (25 mg total) by mouth 3 (three) times daily. PLEASE CONTACT OFFICE FOR ADDITIONAL REFILLS 90 tablet 0  . potassium chloride SA (K-DUR,KLOR-CON) 20 MEQ tablet Take 20 mEq by  mouth 2 (two) times daily.    Marland Kitchen torsemide (DEMADEX) 20 MG tablet Take 20 mg by mouth 2 (two) times daily. Take 60mg  ( 3 tablets ) in am and 40 mg ( 2  tablet ) in pm 60 tablet 3  . doxycycline (VIBRAMYCIN) 100 MG capsule Take 100 mg twice a day for 7 days (Patient not taking: Reported on 11/05/2017) 14 capsule 0  . amLODipine (NORVASC) 5 MG tablet Take 2.5 mg by mouth daily.    Marland Kitchen losartan (COZAAR) 100 MG tablet Take 100 mg by mouth daily.      No facility-administered medications prior to visit.      Allergies:   Patient has no known allergies.   Social History   Socioeconomic History  . Marital status: Married    Spouse name: Not on file  . Number of children: 2  . Years of education: Not on file  . Highest education level: Not on file  Occupational History  . Occupation: self employed     Comment: [employer]  Social Needs  . Financial resource strain: Not on file  . Food insecurity:    Worry: Not on file    Inability: Not on file  . Transportation needs:    Medical: Not on file    Non-medical: Not on file  Tobacco Use  . Smoking status: Former Smoker    Packs/day: 1.50    Years: 38.00    Pack years: 57.00    Types: Cigarettes    Start date: 06/18/1956    Last attempt to quit: 06/18/1994    Years since quitting: 23.4  . Smokeless tobacco: Never Used  Substance and Sexual Activity  . Alcohol use: Yes    Alcohol/week: 0.0 oz    Comment: Beer on a weekly basis.  . Drug use: No  . Sexual activity: Not on file  Lifestyle  . Physical activity:    Days per week: Not on file    Minutes per session: Not on file  . Stress: Not on file  Relationships  . Social connections:    Talks on phone: Not on file    Gets together: Not on file    Attends religious service: Not on file    Active member of club or organization: Not on file    Attends meetings of clubs or organizations: Not on file    Relationship status: Not on file  Other Topics Concern  . Not on file  Social  History Narrative   Originally from [place]. Always lived in [place]. Prior travel to [facility]. 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 No pets currently. No bird, asbestos, mold, or hot tub exposure. He enjoys fishing.      Family History:  The patient's family history includes Colon cancer in his brother; Coronary artery disease in his brother and father; Heart attack in his father; Stomach cancer in his brother.   ROS:   Please  see the history of present illness.    All other systems reviewed and are negative.   Physical Exam:    VS:  BP 124/72 (BP Location: Left Arm)   Pulse 76   Ht 5\' 8"  (1.727 m)   Wt 192 lb 6.4 oz (87.3 kg)   BMI 29.25 kg/m    GENERAL:  Well appearing, elderly WM in NAD HEENT:  PERRL, EOMI, sclera are clear. Oropharynx is clear. NECK:  No jugular venous distention, carotid upstroke brisk and symmetric, no bruits, no thyromegaly or adenopathy LUNGS:  Clear to auscultation bilaterally CHEST:  Unremarkable HEART:  IRRR,  PMI not displaced or sustained,S1 and S2 within normal limits, no S3, no S4: no clicks, no rubs,  loud 3/6 holosystolic murmur at the apex. ABD:  Soft, nontender. BS +, no masses or bruits. No hepatomegaly, no splenomegaly EXT:  2 + pulses throughout, 2-3+ edema below the knees, no cyanosis no clubbing SKIN:  Warm and dry.  No rashes NEURO:  Alert and oriented x 3. Cranial nerves II through XII intact. PSYCH:  Cognitively intact    Wt Readings from Last 3 Encounters:  11/05/17 192 lb 6.4 oz (87.3 kg)  05/17/17 200 lb 12.8 oz (91.1 kg)  11/19/16 199 lb 3.2 oz (90.4 kg)      Studies/Labs Reviewed:     EKG:  EKG is  ordered today.  Afib with rate 76. RAD. Nonspecific ST- T abnormality. I have personally reviewed and interpreted this study.    Recent Labs: 05/17/2017: BUN 24; Creatinine, Ser 1.21; Hemoglobin 11.5; Platelets 217; Potassium 3.4; Sodium  140   Recent Lipid Panel    Component Value Date/Time   CHOL 130 07/26/2015 0423   TRIG 87 07/26/2015 0423   HDL 34 (L) 07/26/2015 0423   CHOLHDL 3.8 07/26/2015 0423   VLDL 17 07/26/2015 0423   LDLCALC 79 07/26/2015 0423   Labs dated 09/17/16: cholesterol 118, triglycerides 94. HDL 35, LDL 64. CMET normal. Dated 03/19/17: cholesterol 152, triglycerides 113, HDL 46, LDL 83. Creatinine 1.35. Other chemistries normal. CBC normal. Dated 08/02/17: creatinine 1.56. BNP 457. Other chemistries normal.  Additional studies/ records that were reviewed today include:   Myoview 2/17 IMPRESSION: 1. Small fixed perfusion defect in the distal lateral wall without wall motion abnormality, likely myocardial thinning. No evidence of pharmacologically induced myocardial ischemia. 2. Mild septal dyskinesis with otherwise normal left ventricular wall motion. 3. Left ventricular ejection fraction 62% 4. Low-risk stress test findings*.  Echo 2/17 Moderate LVH, EF 55-60%, normal wall motion, mild AI, anterior leaflet MVP, moderate to severe MR, masses BAE, mild TR, PASP 56 mmHg  Echo 11/30/16: Study Conclusions  - Left ventricle: The cavity size was normal. Wall thickness was   normal. Systolic function was vigorous. The estimated ejection   fraction was in the range of 65% to 70%. - Aortic valve: There was trivial regurgitation. - Mitral valve: MR is very eccentric directed posterior into LA   Probably moderate in severity DIfficult to accurately assess. - Left atrium: The atrium was severely dilated. - Right ventricle: The cavity size was mildly dilated. - Right atrium: The atrium was massively dilated. - Tricuspid valve: There was moderate regurgitation. - Pulmonary arteries: PA peak pressure: 45 mm Hg (S). - Pericardium, extracardiac: A trivial pericardial effusion was   identified.  Impressions:  - No significant change from report of 2017.exce[t that MR was   reported to be moderate to  severe.  Right/Left Heart Cath and Coronary Angiography  Conclusion     Prox LAD lesion, 25% stenosed.  1. Minor nonobstructive CAD 2. Normal LV function. 3. Moderate mitral insufficiency. 4. Normal right heart pressures. 5. Normal LV filling pressures.   Plan: medical management. MR does not appear severe enough to warrant repair. Will switch lasix to po. May resume Pradaxa in am. Anticipate DC tomorrow.     Indications    Acute diastolic CHF (congestive heart failure) (HCC) [I50.31 (ICD-10-CM)]    Technique and Indications    Procedural Details: The right wrist was prepped, draped, and anesthetized with 1% lidocaine. Using the modified Seldinger technique a 6 Fr slender sheath was placed in the right radial artery. Due to heavy calcification of the right innominate artery we were able to pass a JR4 catheter but no other catheters would pass including a 4 Fr left catheter. We then completed the study using a femoral approach. The right groin was prepped and draped. The groin was anesthetized with 1% lidocaine. Using a modified Seldinger technique a 5 Fr sheath was placed in the right femoral artery and a 7 French sheath was placed in the right femoral vein. A Swan-Ganz catheter was used for the right heart catheterization. Standard protocol was followed for recording of right heart pressures and sampling of oxygen saturations. Fick cardiac output was calculated. Standard Judkins catheters were used for selective coronary angiography and left ventriculography. There were no immediate procedural complications. The patient was transferred to the post catheterization recovery area for further monitoring. Contrast: 70 cc  During this procedure the patient is administered a total of Versed 2 mg and Fentanyl 50 mg to achieve and maintain moderate conscious sedation. The patient's heart rate, blood pressure, and oxygen saturation are monitored continuously during the procedure. The period  of conscious sedation is 45 minutes, of which I was present face-to-face 100% of this time.  Estimated blood loss <50 mL. There were no immediate complications during the procedure.    Coronary Findings    Dominance: Right   Left Main  Vessel was injected. Vessel is normal in caliber. Vessel is angiographically normal.     Left Anterior Descending   . Prox LAD lesion, 25% stenosed. Moderately Calcified.     Left Circumflex  Vessel was injected. Vessel is normal in caliber. Vessel is angiographically normal.     Right Coronary Artery  Vessel was injected. Vessel is normal in caliber. Vessel is angiographically normal.       Right Heart Pressures Hemodynamic findings consistent with mitral valve regurgitation. LV EDP is normal. Normal right heart pressures.    Wall Motion                 Left Heart    Mitral Valve There is moderate (3+) mitral regurgitation.    Coronary Diagrams    Diagnostic Diagram             TEE 08/25/15:Study Conclusions  - Left ventricle: Systolic function was normal. The estimated  ejection fraction was in the range of 55% to 60%. The study is  not technically sufficient to allow evaluation of LV diastolic  function. - Aortic valve: No evidence of vegetation. There was mild  regurgitation. - Mitral valve: There was moderate (at most moderate-to-severe)  regurgitation directed eccentrically and posteriorly. - Left atrium: The atrium was severely dilated. No evidence of  thrombus in the atrial cavity or appendage. No spontaneous echo  contrast was observed. - Right atrium: The atrium was severely dilated. No evidence of  thrombus in the atrial cavity or appendage. - Atrial septum: No defect or patent foramen ovale was identified.  Echo contrast study showed no right-to-left atrial level shunt,  following an increase in RA pressure induced by provocative  maneuvers. - Tricuspid valve: No evidence of vegetation.  No evidence of  vegetation. There was moderate regurgitation directed centrally. - Pulmonary arteries: PA peak pressure: 50 mm Hg (S).  Impressions:  - The severity of mitral insuficiency appeared dependent on the  severity of the elevation in systolic BP (very high at study  onset, normal by the time of study completion). The eccentric jet  and the presence of severe left atrial dilation may lead to  underestimation of MR severity. In aggregate, however, the mitral  regurgitation does not appear to be severe. ASSESSMENT:     1. Chronic atrial fibrillation ([place])   2. Chronic diastolic CHF (congestive heart failure), NYHA class 2 ([place])   3. Mitral valve prolapse   4. Non-rheumatic mitral regurgitation     PLAN:      1. Chronic diastolic CHF. Weight is actually down. He does have persistent edema. No dyspnea or orthopnea.    Will continue  demadex at current dose.   We discussed the possibility of MV repair or Mitraclip but given stability I would not pursue at this time. Continue Toprol for rate control. I will follow up in 6 months.  2.Mitra Regurgitation - Moderate to severe by last Echo and TEE with prolapse of the anterior leaflet. Normal right heart pressures at the time of cath in 2017. Repeat Echo in June 2018 was unchanged. I would have a low threshold for recommending MV repair if symptoms worsen.   3. Chronic AFib - Rate controlled now. Continue metoprolol. On Pradaxa for anticoagulation. He notes Rx is expensive but doesn't want to change to coumadin  4. HTN - BP is well controlled on much less medication.   5. Pulmonary HTN- normal pressures by cardiac cath.   6. Gout.    Medication Adjustments/Labs and Tests Ordered: Above plan discussed at length with patient and his son. Plan hospital admission today.   There are no Patient Instructions on file for this visit.    Signed, Jakiah Goree [place], MD  11/05/2017 9:45 AM    [facility] [address], [place], [place]  [address] Phone: [phone]; Fax: [phone]

## 2017-11-05 ENCOUNTER — Encounter: Payer: Self-pay | Admitting: Cardiology

## 2017-11-05 ENCOUNTER — Ambulatory Visit (INDEPENDENT_AMBULATORY_CARE_PROVIDER_SITE_OTHER): Payer: Medicare Other | Admitting: Cardiology

## 2017-11-05 VITALS — BP 124/72 | HR 76 | Ht 68.0 in | Wt 192.4 lb

## 2017-11-05 DIAGNOSIS — I340 Nonrheumatic mitral (valve) insufficiency: Secondary | ICD-10-CM

## 2017-11-05 DIAGNOSIS — I482 Chronic atrial fibrillation, unspecified: Secondary | ICD-10-CM

## 2017-11-05 DIAGNOSIS — I341 Nonrheumatic mitral (valve) prolapse: Secondary | ICD-10-CM | POA: Diagnosis not present

## 2017-11-05 DIAGNOSIS — I5032 Chronic diastolic (congestive) heart failure: Secondary | ICD-10-CM | POA: Diagnosis not present

## 2017-11-12 DIAGNOSIS — M48062 Spinal stenosis, lumbar region with neurogenic claudication: Secondary | ICD-10-CM | POA: Diagnosis not present

## 2017-11-18 DIAGNOSIS — R42 Dizziness and giddiness: Secondary | ICD-10-CM | POA: Diagnosis not present

## 2017-11-18 DIAGNOSIS — I340 Nonrheumatic mitral (valve) insufficiency: Secondary | ICD-10-CM | POA: Diagnosis not present

## 2017-11-18 DIAGNOSIS — R609 Edema, unspecified: Secondary | ICD-10-CM | POA: Diagnosis not present

## 2017-11-18 DIAGNOSIS — I5032 Chronic diastolic (congestive) heart failure: Secondary | ICD-10-CM | POA: Diagnosis not present

## 2017-11-22 DIAGNOSIS — M48062 Spinal stenosis, lumbar region with neurogenic claudication: Secondary | ICD-10-CM | POA: Diagnosis not present

## 2017-12-06 ENCOUNTER — Emergency Department (HOSPITAL_COMMUNITY): Payer: Medicare Other

## 2017-12-06 ENCOUNTER — Inpatient Hospital Stay (HOSPITAL_COMMUNITY): Payer: Medicare Other

## 2017-12-06 ENCOUNTER — Inpatient Hospital Stay (HOSPITAL_COMMUNITY)
Admission: EM | Admit: 2017-12-06 | Discharge: 2017-12-16 | DRG: 870 | Disposition: E | Payer: Medicare Other | Attending: Pulmonary Disease | Admitting: Pulmonary Disease

## 2017-12-06 ENCOUNTER — Encounter (HOSPITAL_COMMUNITY): Payer: Self-pay

## 2017-12-06 ENCOUNTER — Other Ambulatory Visit: Payer: Self-pay

## 2017-12-06 DIAGNOSIS — I2510 Atherosclerotic heart disease of native coronary artery without angina pectoris: Secondary | ICD-10-CM | POA: Diagnosis present

## 2017-12-06 DIAGNOSIS — J9811 Atelectasis: Secondary | ICD-10-CM | POA: Diagnosis present

## 2017-12-06 DIAGNOSIS — N170 Acute kidney failure with tubular necrosis: Secondary | ICD-10-CM | POA: Diagnosis present

## 2017-12-06 DIAGNOSIS — M48061 Spinal stenosis, lumbar region without neurogenic claudication: Secondary | ICD-10-CM | POA: Diagnosis present

## 2017-12-06 DIAGNOSIS — R531 Weakness: Secondary | ICD-10-CM | POA: Diagnosis not present

## 2017-12-06 DIAGNOSIS — R9401 Abnormal electroencephalogram [EEG]: Secondary | ICD-10-CM | POA: Diagnosis not present

## 2017-12-06 DIAGNOSIS — A419 Sepsis, unspecified organism: Secondary | ICD-10-CM | POA: Diagnosis not present

## 2017-12-06 DIAGNOSIS — D65 Disseminated intravascular coagulation [defibrination syndrome]: Secondary | ICD-10-CM | POA: Diagnosis present

## 2017-12-06 DIAGNOSIS — E872 Acidosis: Secondary | ICD-10-CM | POA: Diagnosis present

## 2017-12-06 DIAGNOSIS — R21 Rash and other nonspecific skin eruption: Secondary | ICD-10-CM | POA: Diagnosis not present

## 2017-12-06 DIAGNOSIS — J9602 Acute respiratory failure with hypercapnia: Secondary | ICD-10-CM | POA: Diagnosis present

## 2017-12-06 DIAGNOSIS — K7460 Unspecified cirrhosis of liver: Secondary | ICD-10-CM | POA: Diagnosis present

## 2017-12-06 DIAGNOSIS — I130 Hypertensive heart and chronic kidney disease with heart failure and stage 1 through stage 4 chronic kidney disease, or unspecified chronic kidney disease: Secondary | ICD-10-CM | POA: Diagnosis present

## 2017-12-06 DIAGNOSIS — J9690 Respiratory failure, unspecified, unspecified whether with hypoxia or hypercapnia: Secondary | ICD-10-CM | POA: Diagnosis not present

## 2017-12-06 DIAGNOSIS — N200 Calculus of kidney: Secondary | ICD-10-CM | POA: Diagnosis not present

## 2017-12-06 DIAGNOSIS — L8990 Pressure ulcer of unspecified site, unspecified stage: Secondary | ICD-10-CM

## 2017-12-06 DIAGNOSIS — R5383 Other fatigue: Secondary | ICD-10-CM | POA: Diagnosis not present

## 2017-12-06 DIAGNOSIS — Z4682 Encounter for fitting and adjustment of non-vascular catheter: Secondary | ICD-10-CM | POA: Diagnosis not present

## 2017-12-06 DIAGNOSIS — Z9289 Personal history of other medical treatment: Secondary | ICD-10-CM

## 2017-12-06 DIAGNOSIS — I10 Essential (primary) hypertension: Secondary | ICD-10-CM | POA: Diagnosis not present

## 2017-12-06 DIAGNOSIS — E870 Hyperosmolality and hypernatremia: Secondary | ICD-10-CM | POA: Diagnosis not present

## 2017-12-06 DIAGNOSIS — Z79899 Other long term (current) drug therapy: Secondary | ICD-10-CM

## 2017-12-06 DIAGNOSIS — Z882 Allergy status to sulfonamides status: Secondary | ICD-10-CM

## 2017-12-06 DIAGNOSIS — D6489 Other specified anemias: Secondary | ICD-10-CM | POA: Diagnosis present

## 2017-12-06 DIAGNOSIS — K7200 Acute and subacute hepatic failure without coma: Secondary | ICD-10-CM | POA: Diagnosis present

## 2017-12-06 DIAGNOSIS — K819 Cholecystitis, unspecified: Secondary | ICD-10-CM | POA: Diagnosis not present

## 2017-12-06 DIAGNOSIS — I081 Rheumatic disorders of both mitral and tricuspid valves: Secondary | ICD-10-CM | POA: Diagnosis present

## 2017-12-06 DIAGNOSIS — R410 Disorientation, unspecified: Secondary | ICD-10-CM | POA: Diagnosis not present

## 2017-12-06 DIAGNOSIS — J9601 Acute respiratory failure with hypoxia: Secondary | ICD-10-CM | POA: Diagnosis not present

## 2017-12-06 DIAGNOSIS — N179 Acute kidney failure, unspecified: Secondary | ICD-10-CM | POA: Diagnosis present

## 2017-12-06 DIAGNOSIS — M109 Gout, unspecified: Secondary | ICD-10-CM | POA: Diagnosis present

## 2017-12-06 DIAGNOSIS — N183 Chronic kidney disease, stage 3 (moderate): Secondary | ICD-10-CM | POA: Diagnosis present

## 2017-12-06 DIAGNOSIS — M549 Dorsalgia, unspecified: Secondary | ICD-10-CM

## 2017-12-06 DIAGNOSIS — K8001 Calculus of gallbladder with acute cholecystitis with obstruction: Secondary | ICD-10-CM | POA: Diagnosis present

## 2017-12-06 DIAGNOSIS — Z7902 Long term (current) use of antithrombotics/antiplatelets: Secondary | ICD-10-CM

## 2017-12-06 DIAGNOSIS — I482 Chronic atrial fibrillation, unspecified: Secondary | ICD-10-CM | POA: Diagnosis present

## 2017-12-06 DIAGNOSIS — J9600 Acute respiratory failure, unspecified whether with hypoxia or hypercapnia: Secondary | ICD-10-CM | POA: Diagnosis not present

## 2017-12-06 DIAGNOSIS — G9341 Metabolic encephalopathy: Secondary | ICD-10-CM | POA: Diagnosis present

## 2017-12-06 DIAGNOSIS — R188 Other ascites: Secondary | ICD-10-CM | POA: Diagnosis present

## 2017-12-06 DIAGNOSIS — E440 Moderate protein-calorie malnutrition: Secondary | ICD-10-CM

## 2017-12-06 DIAGNOSIS — I776 Arteritis, unspecified: Secondary | ICD-10-CM | POA: Diagnosis present

## 2017-12-06 DIAGNOSIS — Z8249 Family history of ischemic heart disease and other diseases of the circulatory system: Secondary | ICD-10-CM

## 2017-12-06 DIAGNOSIS — D696 Thrombocytopenia, unspecified: Secondary | ICD-10-CM | POA: Diagnosis not present

## 2017-12-06 DIAGNOSIS — I5032 Chronic diastolic (congestive) heart failure: Secondary | ICD-10-CM | POA: Diagnosis present

## 2017-12-06 DIAGNOSIS — X58XXXA Exposure to other specified factors, initial encounter: Secondary | ICD-10-CM | POA: Diagnosis not present

## 2017-12-06 DIAGNOSIS — E875 Hyperkalemia: Secondary | ICD-10-CM | POA: Diagnosis present

## 2017-12-06 DIAGNOSIS — K8020 Calculus of gallbladder without cholecystitis without obstruction: Secondary | ICD-10-CM | POA: Diagnosis not present

## 2017-12-06 DIAGNOSIS — J90 Pleural effusion, not elsewhere classified: Secondary | ICD-10-CM | POA: Diagnosis not present

## 2017-12-06 DIAGNOSIS — M545 Low back pain: Secondary | ICD-10-CM | POA: Diagnosis not present

## 2017-12-06 DIAGNOSIS — Z6829 Body mass index (BMI) 29.0-29.9, adult: Secondary | ICD-10-CM

## 2017-12-06 DIAGNOSIS — I4510 Unspecified right bundle-branch block: Secondary | ICD-10-CM | POA: Diagnosis present

## 2017-12-06 DIAGNOSIS — R0902 Hypoxemia: Secondary | ICD-10-CM

## 2017-12-06 DIAGNOSIS — G8929 Other chronic pain: Secondary | ICD-10-CM | POA: Diagnosis present

## 2017-12-06 DIAGNOSIS — Z452 Encounter for adjustment and management of vascular access device: Secondary | ICD-10-CM | POA: Diagnosis not present

## 2017-12-06 DIAGNOSIS — Z9911 Dependence on respirator [ventilator] status: Secondary | ICD-10-CM

## 2017-12-06 DIAGNOSIS — K7589 Other specified inflammatory liver diseases: Secondary | ICD-10-CM | POA: Diagnosis present

## 2017-12-06 DIAGNOSIS — K759 Inflammatory liver disease, unspecified: Secondary | ICD-10-CM | POA: Diagnosis not present

## 2017-12-06 DIAGNOSIS — E860 Dehydration: Secondary | ICD-10-CM | POA: Diagnosis not present

## 2017-12-06 DIAGNOSIS — Z66 Do not resuscitate: Secondary | ICD-10-CM | POA: Diagnosis not present

## 2017-12-06 DIAGNOSIS — R0602 Shortness of breath: Secondary | ICD-10-CM | POA: Diagnosis not present

## 2017-12-06 DIAGNOSIS — I4891 Unspecified atrial fibrillation: Secondary | ICD-10-CM | POA: Diagnosis not present

## 2017-12-06 DIAGNOSIS — E7800 Pure hypercholesterolemia, unspecified: Secondary | ICD-10-CM | POA: Diagnosis present

## 2017-12-06 DIAGNOSIS — I361 Nonrheumatic tricuspid (valve) insufficiency: Secondary | ICD-10-CM | POA: Diagnosis not present

## 2017-12-06 DIAGNOSIS — R4182 Altered mental status, unspecified: Secondary | ICD-10-CM | POA: Diagnosis not present

## 2017-12-06 DIAGNOSIS — E876 Hypokalemia: Secondary | ICD-10-CM | POA: Diagnosis present

## 2017-12-06 DIAGNOSIS — I2729 Other secondary pulmonary hypertension: Secondary | ICD-10-CM | POA: Diagnosis present

## 2017-12-06 DIAGNOSIS — B179 Acute viral hepatitis, unspecified: Secondary | ICD-10-CM | POA: Diagnosis present

## 2017-12-06 DIAGNOSIS — R945 Abnormal results of liver function studies: Secondary | ICD-10-CM | POA: Diagnosis not present

## 2017-12-06 DIAGNOSIS — R6521 Severe sepsis with septic shock: Secondary | ICD-10-CM | POA: Diagnosis present

## 2017-12-06 DIAGNOSIS — Z515 Encounter for palliative care: Secondary | ICD-10-CM | POA: Diagnosis present

## 2017-12-06 DIAGNOSIS — I341 Nonrheumatic mitral (valve) prolapse: Secondary | ICD-10-CM | POA: Diagnosis not present

## 2017-12-06 DIAGNOSIS — K810 Acute cholecystitis: Secondary | ICD-10-CM | POA: Diagnosis not present

## 2017-12-06 DIAGNOSIS — G9340 Encephalopathy, unspecified: Secondary | ICD-10-CM | POA: Diagnosis not present

## 2017-12-06 DIAGNOSIS — Z87891 Personal history of nicotine dependence: Secondary | ICD-10-CM

## 2017-12-06 DIAGNOSIS — R296 Repeated falls: Secondary | ICD-10-CM | POA: Diagnosis not present

## 2017-12-06 DIAGNOSIS — R778 Other specified abnormalities of plasma proteins: Secondary | ICD-10-CM | POA: Diagnosis present

## 2017-12-06 DIAGNOSIS — L270 Generalized skin eruption due to drugs and medicaments taken internally: Secondary | ICD-10-CM | POA: Diagnosis not present

## 2017-12-06 DIAGNOSIS — R748 Abnormal levels of other serum enzymes: Secondary | ICD-10-CM | POA: Diagnosis not present

## 2017-12-06 DIAGNOSIS — T17990A Other foreign object in respiratory tract, part unspecified in causing asphyxiation, initial encounter: Secondary | ICD-10-CM | POA: Diagnosis not present

## 2017-12-06 DIAGNOSIS — R7989 Other specified abnormal findings of blood chemistry: Secondary | ICD-10-CM | POA: Diagnosis present

## 2017-12-06 DIAGNOSIS — E162 Hypoglycemia, unspecified: Secondary | ICD-10-CM | POA: Diagnosis present

## 2017-12-06 LAB — BASIC METABOLIC PANEL
Anion gap: 21 — ABNORMAL HIGH (ref 5–15)
BUN: 37 mg/dL — ABNORMAL HIGH (ref 6–20)
CALCIUM: 9.4 mg/dL (ref 8.9–10.3)
CO2: 16 mmol/L — AB (ref 22–32)
CREATININE: 2.44 mg/dL — AB (ref 0.61–1.24)
Chloride: 97 mmol/L — ABNORMAL LOW (ref 101–111)
GFR calc non Af Amer: 24 mL/min — ABNORMAL LOW (ref >60)
GFR, EST AFRICAN AMERICAN: 28 mL/min — AB (ref >60)
Glucose, Bld: 76 mg/dL (ref 65–99)
Potassium: 4.3 mmol/L (ref 3.5–5.1)
SODIUM: 134 mmol/L — AB (ref 135–145)

## 2017-12-06 LAB — URINALYSIS, ROUTINE W REFLEX MICROSCOPIC
Bilirubin Urine: NEGATIVE
GLUCOSE, UA: NEGATIVE mg/dL
Hgb urine dipstick: NEGATIVE
Ketones, ur: NEGATIVE mg/dL
LEUKOCYTES UA: NEGATIVE
NITRITE: NEGATIVE
Protein, ur: NEGATIVE mg/dL
SPECIFIC GRAVITY, URINE: 1.01 (ref 1.005–1.030)
pH: 5 (ref 5.0–8.0)

## 2017-12-06 LAB — CBC
HCT: 35.7 % — ABNORMAL LOW (ref 39.0–52.0)
Hemoglobin: 10.4 g/dL — ABNORMAL LOW (ref 13.0–17.0)
MCH: 27.5 pg (ref 26.0–34.0)
MCHC: 29.1 g/dL — ABNORMAL LOW (ref 30.0–36.0)
MCV: 94.4 fL (ref 78.0–100.0)
Platelets: 175 10*3/uL (ref 150–400)
RBC: 3.78 MIL/uL — AB (ref 4.22–5.81)
RDW: 18.7 % — ABNORMAL HIGH (ref 11.5–15.5)
WBC: 8.5 10*3/uL (ref 4.0–10.5)

## 2017-12-06 LAB — TROPONIN I
TROPONIN I: 0.05 ng/mL — AB (ref <0.03)
Troponin I: 0.05 ng/mL (ref <0.03)

## 2017-12-06 MED ORDER — ONDANSETRON HCL 4 MG/2ML IJ SOLN: 4.0000 mg | Freq: Four times a day (QID) | INTRAMUSCULAR | Status: DC | PRN

## 2017-12-06 MED ORDER — SODIUM CHLORIDE 0.9 % IV BOLUS
500.0000 mL | Freq: Once | INTRAVENOUS | Status: AC
Start: 2017-12-06 — End: 2017-12-06
  Administered 2017-12-06: 500 mL via INTRAVENOUS

## 2017-12-06 MED ORDER — ACETAMINOPHEN 650 MG RE SUPP: 650.0000 mg | Freq: Four times a day (QID) | RECTAL | Status: DC | PRN

## 2017-12-06 MED ORDER — METOPROLOL TARTRATE 25 MG PO TABS
25.0000 mg | ORAL_TABLET | Freq: Every day | ORAL | Status: DC
  Administered 2017-12-06: 25 mg via ORAL
  Filled 2017-12-06: qty 1

## 2017-12-06 MED ORDER — METOPROLOL TARTRATE 25 MG PO TABS
12.5000 mg | ORAL_TABLET | Freq: Every day | ORAL | Status: DC
  Filled 2017-12-06: qty 1

## 2017-12-06 MED ORDER — SODIUM CHLORIDE 0.9 % IV SOLN
INTRAVENOUS | Status: DC
  Administered 2017-12-07: 01:00:00 via INTRAVENOUS

## 2017-12-06 MED ORDER — LIDOCAINE 5 % EX PTCH
1.0000 | MEDICATED_PATCH | CUTANEOUS | Status: DC
  Administered 2017-12-06 – 2017-12-14 (×9): 1 via TRANSDERMAL
  Filled 2017-12-06 (×11): qty 1

## 2017-12-06 MED ORDER — SODIUM BICARBONATE 650 MG PO TABS
650.0000 mg | ORAL_TABLET | Freq: Three times a day (TID) | ORAL | Status: DC
  Administered 2017-12-06: 650 mg via ORAL
  Filled 2017-12-06 (×3): qty 1

## 2017-12-06 MED ORDER — ACETAMINOPHEN 325 MG PO TABS: 650.0000 mg | ORAL_TABLET | Freq: Four times a day (QID) | ORAL | Status: DC | PRN

## 2017-12-06 MED ORDER — SODIUM CHLORIDE 0.9 % IV SOLN
Freq: Once | INTRAVENOUS | Status: AC
  Administered 2017-12-06: 16:00:00 via INTRAVENOUS

## 2017-12-06 MED ORDER — DOCUSATE SODIUM 100 MG PO CAPS
100.0000 mg | ORAL_CAPSULE | Freq: Every day | ORAL | Status: DC
  Filled 2017-12-06 (×2): qty 1

## 2017-12-06 MED ORDER — DABIGATRAN ETEXILATE MESYLATE 150 MG PO CAPS
150.0000 mg | ORAL_CAPSULE | Freq: Two times a day (BID) | ORAL | Status: DC
  Administered 2017-12-06: 150 mg via ORAL
  Filled 2017-12-06 (×3): qty 1

## 2017-12-06 MED ORDER — ONDANSETRON HCL 4 MG PO TABS: 4.0000 mg | ORAL_TABLET | Freq: Four times a day (QID) | ORAL | Status: DC | PRN

## 2017-12-06 NOTE — H&P (Signed)
History and Physical    Jacob Shannon CBJ:[id number] DOB: 09/06/1940 DOA: 11/29/2017  PCP: Cyndi Bender, PA-C   Patient coming from: Home  Chief Complaint: Lethargy, poor appetite   HPI: Jacob Shannon is a 77 y.o. male with medical history significant for atrial fibrillation on Pradaxa, chronic diastolic CHF, nonobstructive CAD, and lumbar spinal stenosis with chronic low back pain, now presenting to the emergency department for evaluation of lethargy, somnolence, and poor appetite.  Patient follows with neurology and was recently started on Cymbalta and Ultram for his chronic low back pain.  Shortly after starting these medications, the patient developed lethargy, somnolence, and poor appetite.  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  He reports some lightheadedness, but denies change in vision or hearing or focal numbness or weakness.  Denies recent fevers or chills and denies chest pain, palpitations, cough, or shortness of breath.    ED Course: Upon arrival to the ED, patient is found to be afebrile, saturating well on room air, and with vitals otherwise normal.  EKG features atrial fibrillation with incomplete RBBB and nonspecific ST abnormalities that are similar to prior.  Noncontrast head CT is negative for acute intracranial abnormality but notable for old infarcts.  Chest x-ray reveals chronic cardiomegaly and basilar atelectasis.  Chemistry panel is notable for a creatinine of 2.44, up from 1.21 a year ago.  CBC features a chronic normocytic anemia and troponin is slightly elevated to 0.05.  Urinalysis is unremarkable.  Patient was given 500 cc normal saline in the ED, remains hemodynamically stable, and will be admitted for ongoing evaluation and management of lethargy and poor appetite with new renal insufficiency suspected secondary to medication  effect.  Review of Systems:  All other systems reviewed and apart from HPI, are negative.  Past Medical History:  Diagnosis Date  . Chronic atrial fibrillation ([place])   . Complication of anesthesia    pt states that  "i'm hard to wake up"  . Diastolic heart failure ([place])   . Essential hypertension   . Gout   . Hypercholesterolemia   . Leukocytoclastic vasculitis ([place])   . Mitral regurgitation    Moderate August 2016  . Secondary pulmonary hypertension    RVSP 49 mmHg August 2016  . Spinal stenosis   . 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 CARDIAC CATHETERIZATION N/A 08/26/2015   Procedure: Right/Left Heart Cath and Coronary Angiography;  Surgeon: Peter M Martinique, MD;  Location: [facility];  Service: Cardiovascular;  Laterality: N/A;  . CATARACT EXTRACTION Bilateral   . COLONOSCOPY    . LEG SKIN LESION  BIOPSY / EXCISION Right 2010  . TEE WITHOUT CARDIOVERSION N/A 08/25/2015   Procedure: TRANSESOPHAGEAL ECHOCARDIOGRAM (TEE);  Surgeon: Sanda Klein, MD;  Location: [facility];  Service: Cardiovascular;  Laterality: N/A;     reports that he quit smoking about 23 years ago. His smoking use included cigarettes. He started smoking about 61 years ago. He has a 57.00 pack-year smoking history. He has never used smokeless tobacco. He reports that he drinks alcohol. He reports that he does not use drugs.  Allergies  Allergen Reactions  . Tramadol Nausea And Vomiting    Family History  Problem Relation Age of  Onset  . Heart attack Father   . Coronary artery disease Father   . Colon cancer Brother   . Coronary artery disease Brother   . Stomach cancer Brother   . Lung disease Neg Hx   . Rheumatologic disease Neg Hx      Prior to Admission medications   Medication Sig Start Date End Date Taking? Authorizing Provider  colchicine 0.6 MG tablet Take 0.6 mg by mouth 2 (two) times daily.     Yes [provider]  dabigatran (PRADAXA) 150 MG CAPS capsule Take 150 mg by mouth 2 (two) times daily.   Yes [provider]  docusate sodium (COLACE) 100 MG capsule Take 100 mg by mouth daily.   Yes [provider]  DULoxetine (CYMBALTA) 30 MG capsule Take 30 mg by mouth daily.   Yes [provider]  metoprolol tartrate (LOPRESSOR) 25 MG tablet Take 12.5-25 mg by mouth See admin instructions. Take 12.5 mg by mouth in the morning and 25 mg in the evening   Yes [provider]  potassium chloride SA (K-DUR,KLOR-CON) 20 MEQ tablet Take 20 mEq by mouth 2 (two) times daily.   Yes [provider]  torsemide (DEMADEX) 20 MG tablet Take 40-60 mg by mouth See admin instructions. Take 60 mg by mouth in the morning and 40 mg midday for fluid/edema 11/19/16  Yes Martinique, Peter M, MD  metoprolol succinate (TOPROL-XL) 25 MG 24 hr tablet Take 1 tablet (25 mg total) by mouth 3 (three) times daily. PLEASE CONTACT OFFICE FOR ADDITIONAL REFILLS Patient not taking: Reported on 11/29/2017 09/11/16   Martinique, Peter M, MD    Physical Exam: Vitals:   11/19/2017 1630 12/05/2017 1730 12/05/2017 1915 12/01/2017 1945  BP: 127/79 116/75 127/74 126/90  Pulse: 83 87 98 (!) 106  Resp: 17 18 19 19   Temp:      TempSrc:      SpO2: 100% 99% 100% 99%  Weight:      Height:          Constitutional: NAD, somnolent but easily roused   Eyes: PERTLA, lids and conjunctivae normal ENMT: Mucous membranes are moist. Posterior pharynx clear of any exudate or lesions.   Neck: normal, supple, no masses, no thyromegaly Respiratory: clear to auscultation bilaterally, no wheezing, no crackles. Normal respiratory effort.   Cardiovascular: S1 & S2 heard, regular rate and rhythm. Marked bilateral LE edema. No significant JVD. Abdomen: No distension, no tenderness, soft. Bowel sounds normal.  Musculoskeletal: no clubbing / cyanosis. No joint deformity upper and lower extremities.   Skin: no  significant rashes, lesions, ulcers. Warm, dry, well-perfused. Neurologic: No facial asymmetry. Sensation to light touch intact. Strength 5/5 in all 4 limbs.  Psychiatric: Somnolent, easily roused and oriented to person, place, and situation. Pleasant and cooperative.     Labs on Admission: I have personally reviewed following labs and imaging studies  CBC: Recent Labs  Lab 11/19/2017 1131  WBC 8.5  HGB 10.4*  HCT 35.7*  MCV 94.4  PLT 944   Basic Metabolic Panel: Recent Labs  Lab 11/20/2017 1131  NA 134*  K 4.3  CL 97*  CO2 16*  GLUCOSE 76  BUN 37*  CREATININE 2.44*  CALCIUM 9.4   GFR: Estimated Creatinine Clearance: 26.8 mL/min (A) (by C-G formula based on SCr of 2.44 mg/dL (H)). Liver Function Tests: No results for input(s): AST, ALT, ALKPHOS, BILITOT, PROT, ALBUMIN in the last 168 hours. No results for input(s): LIPASE, AMYLASE in the last  168 hours. No results for input(s): AMMONIA in the last 168 hours. Coagulation Profile: No results for input(s): INR, PROTIME in the last 168 hours. Cardiac Enzymes: Recent Labs  Lab 11/19/2017 1609  TROPONINI 0.05*   BNP (last 3 results) No results for input(s): PROBNP in the last 8760 hours. HbA1C: No results for input(s): HGBA1C in the last 72 hours. CBG: No results for input(s): GLUCAP in the last 168 hours. Lipid Profile: No results for input(s): CHOL, HDL, LDLCALC, TRIG, CHOLHDL, LDLDIRECT in the last 72 hours. Thyroid Function Tests: No results for input(s): TSH, T4TOTAL, FREET4, T3FREE, THYROIDAB in the last 72 hours. Anemia Panel: No results for input(s): VITAMINB12, FOLATE, FERRITIN, TIBC, IRON, RETICCTPCT in the last 72 hours. Urine analysis:    Component Value Date/Time   COLORURINE YELLOW 12/07/2017 1813   APPEARANCEUR CLEAR 12/14/2017 1813   LABSPEC 1.010 11/19/2017 1813   PHURINE 5.0 11/28/2017 1813   GLUCOSEU NEGATIVE 12/07/2017 1813   HGBUR NEGATIVE 11/18/2017 1813   BILIRUBINUR NEGATIVE 12/05/2017  1813   KETONESUR NEGATIVE 12/14/2017 1813   PROTEINUR NEGATIVE 11/21/2017 1813   NITRITE NEGATIVE 12/01/2017 1813   LEUKOCYTESUR NEGATIVE 12/03/2017 1813   Sepsis Labs: @LABRCNTIP (procalcitonin:4,lacticidven:4) )No results found for this or any previous visit (from the past 240 hour(s)).   Radiological Exams on Admission: Ct Head Wo Contrast  Result Date: 11/24/2017 CLINICAL DATA:  Worsening confusion, increasing thirst for a few days. Recurrent falls. History of atrial fibrillation, hypertension, hypercholesterolemia. EXAM: CT HEAD WITHOUT CONTRAST TECHNIQUE: Contiguous axial images were obtained from the base of the skull through the vertex without intravenous contrast. COMPARISON:  None. FINDINGS: BRAIN: No intraparenchymal hemorrhage, mass effect nor midline shift. The ventricles and sulci are normal for age. Patchy supratentorial white matter hypodensities less than expected for patient's age, though non-specific are most compatible with chronic small vessel ischemic disease. 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 Old bilateral cerebellar infarcts. Beam hardening artifact through bilateral occipital lobes. No acute large vascular territory infarct. Punctate parenchymal calcifications. No abnormal extra-axial fluid collections. Basal cisterns are patent. VASCULAR: Moderate calcific atherosclerosis of the carotid siphons. SKULL: No skull fracture. No significant scalp soft tissue swelling. No sellar expansion. SINUSES/ORBITS: Mild paranasal sinus mucosal thickening with air-fluid levels. Mastoid air cells are well aerated.The included ocular globes and orbital contents are non-suspicious. Status post bilateral ocular lens implants. OTHER: None. IMPRESSION: 1. No acute intracranial process. 2. 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 Old bilateral cerebellar infarcts. 3. Beam hardening artifact occipital lobes, unlikely to reflect stroke or PRES. Electronically Signed   By:  Elon Alas M.D.   On: 12/05/2017 17:57   Dg Chest Port 1 View  Result Date: 12/13/2017 CLINICAL DATA:  Shortness of breath and weakness. Fusion. Lethargic. EXAM: PORTABLE CHEST 1 VIEW COMPARISON:  07/26/2017 FINDINGS: Chronic cardiomegaly. Pulmonary vascularity is normal in the lungs are clear except for minimal atelectasis at the lung bases laterally. No consolidative infiltrates or effusions. No acute bone abnormality. Aortic atherosclerosis. IMPRESSION: Chronic cardiomegaly. Minimal atelectasis at the lung bases laterally. Aortic Atherosclerosis (ICD10-I70.0). Electronically Signed   By: Lorriane Shire M.D.   On: 12/09/2017 15:59    EKG: Independently reviewed. Atrial fibrillation, incomplete RBBB, ST-T abnormalities are similar to prior.   Assessment/Plan  1. Lethargy  - Presents with several days of lethargy, somnolence, poor appetite, and poor oral intake  - Head CT is negative for acute intracranial abnormalities and no focal deficit identified on exam  - Sxs began shortly after starting Cymbalta and  Ultram for chronic low back pain; suspect this may be secondary to the new medications; cerebrovascular or cardiac etiologies less likely   - Hold Cymbalta and Ultram, continue supportive care with gentle IVF hydration, consult with PT for eval and tx    2. Renal insufficiency  - SCr is 2.44 on admission, up from 1.21 one year ago  - Suspect this is acute prerenal azotemia given hx of recent lethargy with poor appetite and reduced oral intake   - There is a metabolic acidosis associated with this  - Treated in ED with 500 cc NS and continued on gentle IVF hydration  - Check RUS and urine chemistries, renally-dose medications, avoid nephrotoxins, give bicarb, repeat chem panel in am    3. Chronic low back pain  - Follows neurology, attributed to lumbar spinal stenosis  - Recently started on Cymbalta and Ultram, but his current condition with lethargy began shortly after starting  these   - No pain complaints on admission  - Hold Cymbalta and Ultram, and use lidocaine patch and prn APAP for now    4. Atrial fibrillation  - In rate-controlled a fib on admission  - CHADS-VASc is at least 61 (age x2, CHF, CAD)  - Continue Pradaxa and metoprolol    5. Chronic diastolic CHF  - Significant LE edema noted bilaterally but no JVD or SOB  - Diuretic held while hydrating  - Follow daily wt and I/O's, continue beta-blocker    6. Elevated troponin  - Troponin is 0.05 in ED  - There is no chest pain or SOB  - ST abnormalities on EKG are similar to prior  - Minor non-obstructive CAD on cath from 2017  - Continue cardiac monitoring, trend troponin, continue beta-blocker    DVT prophylaxis: Pradaxa  Code Status: Full  Family Communication: Discussed with patient Consults called: None Admission status: Inpatient    Vianne Bulls, MD [facility] Pager [phone]  If 7PM-7AM, please contact night-coverage [email or web address] Password [facility]  11/29/2017, 8:08 PM

## 2017-12-06 NOTE — ED Notes (Signed)
Have tried to get UA. PT not able to urinate. Nurse notified.

## 2017-12-06 NOTE — Progress Notes (Signed)
Pt has arrived to [facility]. Grandson at the bedside. Pt identified appropriately, VS stable, denied chest pain and SOB, no signs of acute distress. Cardiac monitor placed on pt and CCMD notified. Assessment completed.  Pt oriented to room and equipment, instructed to use call bell for assistance. Call bell within reach. Will continue to monitor and treat pt per MD orders.

## 2017-12-06 NOTE — Progress Notes (Addendum)
Received report Angela,RN in ED.

## 2017-12-06 NOTE — ED Triage Notes (Signed)
Pt endorses starting tramadol and duloxetine x 2 weeks ago for chronic back issues by ortho. Over the last few days pt has been lethargic, excessively thirsty and slightly confused and has had 2 falls in the last week. Did not hit head, no loc. No neuro deficits.

## 2017-12-06 NOTE — ED Provider Notes (Signed)
[facility] EMERGENCY DEPARTMENT Provider Note   CSN: [id number] Arrival date & time: 12/12/2017  1108     History   Chief Complaint Chief Complaint  Patient presents with  . Weakness    HPI Jacob Shannon is a 77 y.o. male.  Chief complaint is weakness, thirsty,?  Confused  HPI: 77 year old male.  Has primary care in [facility].  Sees Dr. Martinique for A. Fib.  Saw an orthopedist regarding chronic back and leg pain.  Was prescribed Cymbalta, and tramadol on today, 8 days ago.  Started this on Friday 7 days ago.  Since that time has been weak.  Complaining of being thirsty.  Family states he is eating and drinking very little.  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  They state that he gets around slowly but normally functions well.  He has been at home either in bed or sitting or laying all this week.  He feels he is drinking well.  Family does not.  No areas of pain.  No fall or injury.  No headache.  No shortness of breath or chest pain.  No abdominal pain.  He states he urinates daily.  Past Medical History:  Diagnosis Date  . Chronic atrial fibrillation ([place])   . Complication of anesthesia    pt states that  "i'm hard to wake up"  . Diastolic heart failure ([place])   . Essential hypertension   . Gout   . Hypercholesterolemia   . Leukocytoclastic vasculitis ([place])   . Mitral regurgitation    Moderate August 2016  . Secondary pulmonary hypertension    RVSP 49 mmHg August 2016  . Spinal stenosis   . 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 Spinal stenosis   . Leukocytoclastic vasculitis ([place])   . Hypercholesterolemia   . Complication of anesthesia   . Chronic atrial fibrillation ([place])   . Chronic diastolic CHF (congestive heart failure), NYHA class 2  ([place]) 09/09/2015  . Acute on chronic combined systolic and diastolic CHF, NYHA class 4 ([place]) 08/23/2015  . Acute on chronic combined systolic (congestive) and diastolic (congestive) heart failure ([place]) 08/23/2015  . Mitral valve prolapse 07/26/2015  . Mitral insufficiency 07/26/2015  . Elevated troponin   . Chest pain 07/24/2015  . Hyperglycemia 07/24/2015  . Pain in the chest   . Encounter for follow-up examination after completed treatment for conditions other than malignant neoplasm 07/15/2015  . Mediastinal adenopathy 03/08/2015  . LAD (lymphadenopathy), mediastinal 03/08/2015  . Essential hypertension 02/08/2015  . Diastolic CHF ([place]) 06/20/7251  . Mitral regurgitation 02/08/2015  . Vasculitis ([place]) 02/08/2015  . Atrial fibrillation ([place]) 02/08/2015  . Gout 02/08/2015  . MI (mitral incompetence) 02/08/2015  . Diastolic heart failure ([place]) 02/08/2015  . Pulmonary hypertension ([place]) 10/07/2014    Past Surgical History:  Procedure Laterality Date  . CARDIAC CATHETERIZATION N/A 08/26/2015   Procedure: Right/Left Heart Cath and Coronary Angiography;  Surgeon: Peter M Martinique, MD;  Location: [facility];  Service: Cardiovascular;  Laterality: N/A;  . CATARACT EXTRACTION Bilateral   . COLONOSCOPY    . LEG SKIN LESION  BIOPSY / EXCISION Right 2010  . TEE WITHOUT CARDIOVERSION N/A 08/25/2015   Procedure: TRANSESOPHAGEAL ECHOCARDIOGRAM (TEE);  Surgeon: Sanda Klein, MD;  Location: [facility] ENDOSCOPY;  Service: Cardiovascular;  Laterality: N/A;        Home Medications    Prior to Admission medications   Medication Sig Start Date End Date Taking? Authorizing Provider  colchicine 0.6 MG tablet Take 0.6 mg by mouth 2 (two) times daily.    Yes [provider]  dabigatran (PRADAXA) 150 MG CAPS capsule Take 150 mg by mouth 2 (two) times daily.   Yes [provider]  docusate sodium (COLACE) 100 MG capsule Take 100 mg by mouth daily.   Yes [provider]    DULoxetine (CYMBALTA) 30 MG capsule Take 30 mg by mouth daily.   Yes [provider]  metoprolol tartrate (LOPRESSOR) 25 MG tablet Take 12.5-25 mg by mouth See admin instructions. Take 12.5 mg by mouth in the morning and 25 mg in the evening   Yes [provider]  potassium chloride SA (K-DUR,KLOR-CON) 20 MEQ tablet Take 20 mEq by mouth 2 (two) times daily.   Yes [provider]  torsemide (DEMADEX) 20 MG tablet Take 40-60 mg by mouth See admin instructions. Take 60 mg by mouth in the morning and 40 mg midday for fluid/edema 11/19/16  Yes Martinique, Peter M, MD  doxycycline (VIBRAMYCIN) 100 MG capsule Take 100 mg twice a day for 7 days Patient not taking: Reported on 11/22/2017 05/17/17   Martinique, Peter M, MD  metoprolol succinate (TOPROL-XL) 25 MG 24 hr tablet Take 1 tablet (25 mg total) by mouth 3 (three) times daily. PLEASE CONTACT OFFICE FOR ADDITIONAL REFILLS Patient not taking: Reported on 12/10/2017 09/11/16   Martinique, Peter M, MD    Family History Family History  Problem Relation Age of Onset  . Heart attack Father   . Coronary artery disease Father   . Colon cancer Brother   . Coronary artery disease Brother   . Stomach cancer Brother   . Lung disease Neg Hx   . Rheumatologic disease Neg Hx     Social History Social History   Tobacco Use  . Smoking status: Former Smoker    Packs/day: 1.50    Years: 38.00    Pack years: 57.00    Types: Cigarettes    Start date: 06/18/1956    Last attempt to quit: 06/18/1994    Years since quitting: 23.4  . Smokeless tobacco: Never Used  Substance Use Topics  . Alcohol use: Yes    Alcohol/week: 0.0 oz    Comment: Beer on a weekly basis.  . Drug use: No     Allergies   Tramadol   Review of Systems Review of Systems  Constitutional: Negative for appetite change, chills, diaphoresis, fatigue and fever.       Thirsty  HENT: Negative for mouth sores, sore throat and trouble swallowing.   Eyes: Negative for visual  disturbance.  Respiratory: Negative for cough, chest tightness, shortness of breath and wheezing.   Cardiovascular: Negative for chest pain.  Gastrointestinal: Negative for abdominal distention, abdominal pain, diarrhea, nausea and vomiting.  Endocrine: Negative for polydipsia, polyphagia and polyuria.  Genitourinary: Negative for dysuria, frequency and hematuria.  Musculoskeletal: Negative for gait problem.       Chronic back and leg pain  Skin: Negative for color change, pallor and rash.  Neurological: Positive for weakness. Negative for dizziness, syncope, light-headedness and headaches.  Hematological: Does not bruise/bleed easily.  Psychiatric/Behavioral: Negative for behavioral problems and confusion.     Physical Exam Updated Vital Signs BP 116/75  Pulse 87   Temp 97.7 F (36.5 C) (Oral)   Resp 18   Ht 5\' 8"  (1.727 m)   Wt 87.1 kg (192 lb)   SpO2 99%   BMI 29.19 kg/m   Physical Exam  Constitutional: He is oriented to person, place, and time. He appears well-developed and well-nourished. No distress.  Awake and alert.  Methodical slow speech.  Hard of hearing.  Subtle confusion  HENT:  Head: Normocephalic.  Mucous membranes dry  Eyes: Pupils are equal, round, and reactive to light. Conjunctivae are normal. No scleral icterus.  Neck: Normal range of motion. Neck supple. No thyromegaly present.  Cardiovascular: Normal rate and regular rhythm. Exam reveals no gallop and no friction rub.  No murmur heard. Pulmonary/Chest: Effort normal and breath sounds normal. No respiratory distress. He has no wheezes. He has no rales.  Abdominal: Soft. Bowel sounds are normal. He exhibits no distension. There is no tenderness. There is no rebound.  Musculoskeletal: Normal range of motion.  Neurological: He is alert and oriented to person, place, and time.  No focal motor deficits.  Normal sensation.  Oriented x3  Skin: Skin is warm and dry. No rash noted.  Psychiatric: He has a  normal mood and affect. His behavior is normal.     ED Treatments / Results  Labs (all labs ordered are listed, but only abnormal results are displayed) Labs Reviewed  BASIC METABOLIC PANEL - Abnormal; Notable for the following components:      Result Value   Sodium 134 (*)    Chloride 97 (*)    CO2 16 (*)    BUN 37 (*)    Creatinine, Ser 2.44 (*)    GFR calc non Af Amer 24 (*)    GFR calc Af Amer 28 (*)    Anion gap 21 (*)    All other components within normal limits  CBC - Abnormal; Notable for the following components:   RBC 3.78 (*)    Hemoglobin 10.4 (*)    HCT 35.7 (*)    MCHC 29.1 (*)    RDW 18.7 (*)    All other components within normal limits  TROPONIN I - Abnormal; Notable for the following components:   Troponin I 0.05 (*)    All other components within normal limits  URINALYSIS, ROUTINE W REFLEX MICROSCOPIC    EKG EKG Interpretation  Date/Time:  Friday December 06 2017 11:14:47 EDT Ventricular Rate:  100 PR Interval:    QRS Duration: 96 QT Interval:  378 QTC Calculation: 487 R Axis:   118 Text Interpretation:  Atrial fibrillation Right axis deviation Incomplete right bundle branch block Possible Right ventricular hypertrophy Marked ST abnormality, possible septal subendocardial injury Prolonged QT Abnormal ECG Confirmed by Nat Christen [id number]) on 11/28/2017 2:40:17 PM   Radiology Ct Head Wo Contrast  Result Date: 12/07/2017 CLINICAL DATA:  Worsening confusion, increasing thirst for a few days. Recurrent falls. History of atrial fibrillation, hypertension, hypercholesterolemia. EXAM: CT HEAD WITHOUT CONTRAST TECHNIQUE: Contiguous axial images were obtained from the base of the skull through the vertex without intravenous contrast. COMPARISON:  None. FINDINGS: BRAIN: No intraparenchymal hemorrhage, mass effect nor midline shift. The ventricles and sulci are normal for age. Patchy supratentorial white matter hypodensities less than expected for patient's age, though  non-specific are most compatible with chronic small vessel ischemic disease. 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 Old bilateral cerebellar infarcts. Beam hardening artifact through bilateral occipital lobes. No acute large vascular territory infarct. Punctate parenchymal  calcifications. No abnormal extra-axial fluid collections. Basal cisterns are patent. VASCULAR: Moderate calcific atherosclerosis of the carotid siphons. SKULL: No skull fracture. No significant scalp soft tissue swelling. No sellar expansion. SINUSES/ORBITS: Mild paranasal sinus mucosal thickening with air-fluid levels. Mastoid air cells are well aerated.The included ocular globes and orbital contents are non-suspicious. Status post bilateral ocular lens implants. OTHER: None. IMPRESSION: 1. No acute intracranial process. 2. 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 Old bilateral cerebellar infarcts. 3. Beam hardening artifact occipital lobes, unlikely to reflect stroke or PRES. Electronically Signed   By: Elon Alas M.D.   On: 11/23/2017 17:57   Dg Chest Port 1 View  Result Date: 11/25/2017 CLINICAL DATA:  Shortness of breath and weakness. Fusion. Lethargic. EXAM: PORTABLE CHEST 1 VIEW COMPARISON:  07/26/2017 FINDINGS: Chronic cardiomegaly. Pulmonary vascularity is normal in the lungs are clear except for minimal atelectasis at the lung bases laterally. No consolidative infiltrates or effusions. No acute bone abnormality. Aortic atherosclerosis. IMPRESSION: Chronic cardiomegaly. Minimal atelectasis at the lung bases laterally. Aortic Atherosclerosis (ICD10-I70.0). Electronically Signed   By: Lorriane Shire M.D.   On: 12/13/2017 15:59    Procedures Procedures (including critical care time)  Medications Ordered in ED Medications  sodium chloride 0.9 % bolus 500 mL (0 mLs Intravenous Stopped 12/10/2017 1643)  0.9 %  sodium chloride infusion ( Intravenous New Bag/Given 11/20/2017 1546)     Initial Impression /  Assessment and Plan / ED Course  I have reviewed the triage vital signs and the nursing notes.  Pertinent labs & imaging results that were available during my care of the patient were reviewed by me and considered in my medical decision making (see chart for details).    EKG Interpretation  Date/Time:  Friday December 06 2017 11:14:47 EDT Ventricular Rate:  100 PR Interval:    QRS Duration: 96 QT Interval:  378 QTC Calculation: 487 R Axis:   118 Text Interpretation:  Atrial fibrillation Right axis deviation Incomplete right bundle branch block Possible Right ventricular hypertrophy Marked ST abnormality, possible septal subendocardial injury Prolonged QT Abnormal ECG Confirmed by Nat Christen [phone] on 11/20/2017 2:40:17 PM  A. fib with controlled response.  CT head normal.  Chest x-ray no acute processes.  Acute kidney injury with creatinine of 2.4.  Sodium 134.  BUN 37.  Anion gap 21 CO2 16.  Charted on IV fluid 500 bolus and maintenance at 100.  He is still globally weak.  Sleeping but awakens easily.  No indication for further studies at this time.  Considering his generalized decline, acute kidney injury, and malaise, will discuss with hospitalist regarding admission for hydration.  He is producing urine.  His urine does not appear infected here.  Some of these effects may be related to his medications.  This may be sedation related to them.  This may be prolonged effect of medication secondary to AKI.  I will recommend admission for fluids and possible physical therapy evaluations if not improving with hydration  Final Clinical Impressions(s) / ED Diagnoses   Final diagnoses:  Weakness  Dehydration  AKI (acute kidney injury) [facility])    ED Discharge Orders    None       Tanna Furry, MD 11/16/2017 1912

## 2017-12-06 NOTE — Progress Notes (Signed)
Troponin 0.05, same as a previous value, no change in pt condition. MD notified.

## 2017-12-06 NOTE — ED Notes (Signed)
Assisted pt with bedpan

## 2017-12-06 NOTE — ED Notes (Signed)
Pt rounded on and family informed on wait time.

## 2017-12-06 NOTE — ED Notes (Signed)
Patient transported to Ultrasound 

## 2017-12-07 ENCOUNTER — Inpatient Hospital Stay (HOSPITAL_COMMUNITY): Payer: Medicare Other

## 2017-12-07 DIAGNOSIS — R4182 Altered mental status, unspecified: Secondary | ICD-10-CM

## 2017-12-07 LAB — CBC WITH DIFFERENTIAL/PLATELET
Abs Immature Granulocytes: 0.1 10*3/uL (ref 0.0–0.1)
BASOS PCT: 0 %
Basophils Absolute: 0 10*3/uL (ref 0.0–0.1)
Basophils Absolute: 0 10*3/uL (ref 0.0–0.1)
Basophils Relative: 0 %
EOS ABS: 0 10*3/uL (ref 0.0–0.7)
EOS ABS: 0 10*3/uL (ref 0.0–0.7)
EOS PCT: 0 %
EOS PCT: 0 %
HCT: 35 % — ABNORMAL LOW (ref 39.0–52.0)
HEMATOCRIT: 40 % (ref 39.0–52.0)
HEMOGLOBIN: 11.2 g/dL — AB (ref 13.0–17.0)
Hemoglobin: 10 g/dL — ABNORMAL LOW (ref 13.0–17.0)
Immature Granulocytes: 1 %
LYMPHS ABS: 0.5 10*3/uL — AB (ref 0.7–4.0)
LYMPHS PCT: 5 %
Lymphocytes Relative: 4 %
Lymphs Abs: 0.6 10*3/uL — ABNORMAL LOW (ref 0.7–4.0)
MCH: 27.5 pg (ref 26.0–34.0)
MCH: 27.5 pg (ref 26.0–34.0)
MCHC: 28 g/dL — ABNORMAL LOW (ref 30.0–36.0)
MCHC: 28.6 g/dL — AB (ref 30.0–36.0)
MCV: 98.3 fL (ref 78.0–100.0)
MCV: 96.4 fL (ref 78.0–100.0)
MONO ABS: 1.4 10*3/uL — AB (ref 0.1–1.0)
Monocytes Absolute: 1.7 10*3/uL — ABNORMAL HIGH (ref 0.1–1.0)
Monocytes Relative: 13 %
Monocytes Relative: 11 %
NEUTROS ABS: 10.3 10*3/uL — AB (ref 1.7–7.7)
Neutro Abs: 10.7 10*3/uL — ABNORMAL HIGH (ref 1.7–7.7)
Neutrophils Relative %: 82 %
Neutrophils Relative %: 84 %
Platelets: 150 10*3/uL (ref 150–400)
Platelets: 166 10*3/uL (ref 150–400)
RBC: 4.07 MIL/uL — AB (ref 4.22–5.81)
RBC: 3.63 MIL/uL — ABNORMAL LOW (ref 4.22–5.81)
RDW: 19.2 % — AB (ref 11.5–15.5)
RDW: 18.9 % — ABNORMAL HIGH (ref 11.5–15.5)
WBC: 13.1 10*3/uL — AB (ref 4.0–10.5)
WBC: 12.3 10*3/uL — ABNORMAL HIGH (ref 4.0–10.5)

## 2017-12-07 LAB — BASIC METABOLIC PANEL
Anion gap: 23 — ABNORMAL HIGH (ref 5–15)
BUN: 53 mg/dL — ABNORMAL HIGH (ref 6–20)
CALCIUM: 9.2 mg/dL (ref 8.9–10.3)
CHLORIDE: 99 mmol/L — AB (ref 101–111)
CO2: 12 mmol/L — AB (ref 22–32)
CREATININE: 3.21 mg/dL — AB (ref 0.61–1.24)
GFR calc Af Amer: 20 mL/min — ABNORMAL LOW (ref >60)
GFR calc non Af Amer: 17 mL/min — ABNORMAL LOW (ref >60)
GLUCOSE: 101 mg/dL — AB (ref 65–99)
Potassium: 4.3 mmol/L (ref 3.5–5.1)
Sodium: 134 mmol/L — ABNORMAL LOW (ref 135–145)

## 2017-12-07 LAB — BLOOD GAS, ARTERIAL
ACID-BASE DEFICIT: 13.9 mmol/L — AB (ref 0.0–2.0)
ACID-BASE DEFICIT: 14.7 mmol/L — AB (ref 0.0–2.0)
BICARBONATE: 10.8 mmol/L — AB (ref 20.0–28.0)
Bicarbonate: 10.7 mmol/L — ABNORMAL LOW (ref 20.0–28.0)
Drawn by: 270221
Drawn by: 51702
FIO2: 36
O2 Content: 4 L/min
O2 SAT: 95.8 %
O2 Saturation: 98.2 %
PATIENT TEMPERATURE: 97.5
PATIENT TEMPERATURE: 98.6
PCO2 ART: 22.8 mmHg — AB (ref 32.0–48.0)
PH ART: 7.291 — AB (ref 7.350–7.450)
PO2 ART: 113 mmHg — AB (ref 83.0–108.0)
pCO2 arterial: 19.8 mmHg — CL (ref 32.0–48.0)
pH, Arterial: 7.352 (ref 7.350–7.450)
pO2, Arterial: 148 mmHg — ABNORMAL HIGH (ref 83.0–108.0)

## 2017-12-07 LAB — PROTIME-INR

## 2017-12-07 LAB — PROCALCITONIN: Procalcitonin: 2.63 ng/mL

## 2017-12-07 LAB — HEPATIC FUNCTION PANEL
ALK PHOS: 57 U/L (ref 38–126)
ALT: 270 U/L — AB (ref 17–63)
AST: 456 U/L — AB (ref 15–41)
Albumin: 2.9 g/dL — ABNORMAL LOW (ref 3.5–5.0)
BILIRUBIN INDIRECT: 2.8 mg/dL — AB (ref 0.3–0.9)
Bilirubin, Direct: 3.1 mg/dL — ABNORMAL HIGH (ref 0.1–0.5)
Total Bilirubin: 5.9 mg/dL — ABNORMAL HIGH (ref 0.3–1.2)
Total Protein: 6.3 g/dL — ABNORMAL LOW (ref 6.5–8.1)

## 2017-12-07 LAB — COOXEMETRY PANEL
CARBOXYHEMOGLOBIN: 1.1 % (ref 0.5–1.5)
Methemoglobin: 1.8 % — ABNORMAL HIGH (ref 0.0–1.5)
O2 SAT: 48 %
TOTAL HEMOGLOBIN: 10.1 g/dL — AB (ref 12.0–16.0)

## 2017-12-07 LAB — GLUCOSE, CAPILLARY
GLUCOSE-CAPILLARY: 37 mg/dL — AB (ref 65–99)
GLUCOSE-CAPILLARY: 69 mg/dL (ref 65–99)
GLUCOSE-CAPILLARY: 94 mg/dL (ref 65–99)
GLUCOSE-CAPILLARY: 94 mg/dL (ref 65–99)
Glucose-Capillary: 48 mg/dL — ABNORMAL LOW (ref 65–99)
Glucose-Capillary: 67 mg/dL (ref 65–99)
Glucose-Capillary: <10 mg/dL — CL (ref 65–99)

## 2017-12-07 LAB — AMYLASE: Amylase: 50 U/L (ref 28–100)

## 2017-12-07 LAB — LACTIC ACID, PLASMA
LACTIC ACID, VENOUS: 11.7 mmol/L — AB (ref 0.5–1.9)
Lactic Acid, Venous: 10.4 mmol/L (ref 0.5–1.9)

## 2017-12-07 LAB — AMMONIA
Ammonia: 32 umol/L (ref 9–35)
Ammonia: 84 umol/L — ABNORMAL HIGH (ref 9–35)

## 2017-12-07 LAB — LIPASE, BLOOD: LIPASE: 61 U/L — AB (ref 11–51)

## 2017-12-07 LAB — TROPONIN I: Troponin I: 0.18 ng/mL (ref <0.03)

## 2017-12-07 LAB — MAGNESIUM: Magnesium: 2.5 mg/dL — ABNORMAL HIGH (ref 1.7–2.4)

## 2017-12-07 LAB — VITAMIN B12: Vitamin B-12: 4213 pg/mL — ABNORMAL HIGH (ref 180–914)

## 2017-12-07 LAB — ACETAMINOPHEN LEVEL: Acetaminophen (Tylenol), Serum: <10 ug/mL — ABNORMAL LOW (ref 10–30)

## 2017-12-07 LAB — CREATININE, URINE, RANDOM: CREATININE, URINE: 87.93 mg/dL

## 2017-12-07 LAB — TSH: TSH: 1.586 u[IU]/mL (ref 0.350–4.500)

## 2017-12-07 LAB — CK: Total CK: 1090 U/L — ABNORMAL HIGH (ref 49–397)

## 2017-12-07 LAB — PHOSPHORUS: PHOSPHORUS: 8.4 mg/dL — AB (ref 2.5–4.6)

## 2017-12-07 LAB — SODIUM, URINE, RANDOM

## 2017-12-07 MED ORDER — LACTATED RINGERS IV BOLUS
1000.0000 mL | Freq: Once | INTRAVENOUS | Status: AC
  Administered 2017-12-07: 1000 mL via INTRAVENOUS

## 2017-12-07 MED ORDER — IDARUCIZUMAB 2.5 GM/50ML IV SOLN
5.0000 g | INTRAVENOUS | Status: AC
  Administered 2017-12-07: 5 g via INTRAVENOUS
  Filled 2017-12-07 (×2): qty 100

## 2017-12-07 MED ORDER — PIPERACILLIN-TAZOBACTAM 3.375 G IVPB
3.3750 g | Freq: Once | INTRAVENOUS | Status: AC
  Administered 2017-12-07: 3.375 g via INTRAVENOUS
  Filled 2017-12-07: qty 50

## 2017-12-07 MED ORDER — SODIUM CHLORIDE 0.9 % IV SOLN
INTRAVENOUS | Status: DC
  Administered 2017-12-07: 08:00:00 via INTRAVENOUS

## 2017-12-07 MED ORDER — VANCOMYCIN HCL IN DEXTROSE 1-5 GM/200ML-% IV SOLN
1000.0000 mg | Freq: Once | INTRAVENOUS | Status: AC
  Administered 2017-12-07: 1000 mg via INTRAVENOUS
  Filled 2017-12-07: qty 200

## 2017-12-07 MED ORDER — DEXTROSE-NACL 5-0.9 % IV SOLN
INTRAVENOUS | Status: DC
  Administered 2017-12-07: 12:00:00 via INTRAVENOUS

## 2017-12-07 MED ORDER — LIDOCAINE HCL 1 % IJ SOLN: 5.0000 mL | Freq: Once | INTRAMUSCULAR | Status: AC

## 2017-12-07 MED ORDER — VITAMIN K1 10 MG/ML IJ SOLN
10.0000 mg | Freq: Once | INTRAMUSCULAR | Status: AC
  Administered 2017-12-07: 10 mg via INTRAVENOUS
  Filled 2017-12-07: qty 1

## 2017-12-07 MED ORDER — DILTIAZEM HCL-DEXTROSE 100-5 MG/100ML-% IV SOLN (PREMIX)
5.0000 mg/h | INTRAVENOUS | Status: DC
  Administered 2017-12-07 – 2017-12-12 (×3): 5 mg/h via INTRAVENOUS
  Administered 2017-12-13: 15 mg/h via INTRAVENOUS
  Filled 2017-12-07 (×4): qty 100

## 2017-12-07 MED ORDER — LIDOCAINE HCL (PF) 1 % IJ SOLN
INTRAMUSCULAR | Status: AC
  Administered 2017-12-07: 5 mL
  Filled 2017-12-07: qty 5

## 2017-12-07 MED ORDER — "THROMBI-PAD 3""X3"" EX PADS"
1.0000 | MEDICATED_PAD | Freq: Once | CUTANEOUS | Status: AC
  Administered 2017-12-07: 1 via TOPICAL
  Filled 2017-12-07: qty 1

## 2017-12-07 MED ORDER — DILTIAZEM LOAD VIA INFUSION
10.0000 mg | Freq: Once | INTRAVENOUS | Status: AC
  Administered 2017-12-07: 10 mg via INTRAVENOUS
  Filled 2017-12-07: qty 10

## 2017-12-07 MED ORDER — DEXTROSE 50 % IV SOLN
INTRAVENOUS | Status: AC
  Administered 2017-12-07: 50 mL
  Filled 2017-12-07: qty 50

## 2017-12-07 MED ORDER — SODIUM CHLORIDE 0.9% FLUSH: 10.0000 mL | INTRAVENOUS | Status: DC | PRN

## 2017-12-07 MED ORDER — DEXTROSE 5 % IV SOLN
INTRAVENOUS | Status: DC
  Administered 2017-12-07 – 2017-12-10 (×4): via INTRAVENOUS
  Filled 2017-12-07 (×8): qty 150

## 2017-12-07 MED ORDER — DEXTROSE 50 % IV SOLN
INTRAVENOUS | Status: AC
  Filled 2017-12-07: qty 50

## 2017-12-07 MED ORDER — LORAZEPAM 2 MG/ML IJ SOLN
0.5000 mg | Freq: Four times a day (QID) | INTRAMUSCULAR | Status: DC | PRN
  Administered 2017-12-08: 0.5 mg via INTRAVENOUS
  Filled 2017-12-07: qty 1

## 2017-12-07 MED ORDER — MORPHINE SULFATE (PF) 2 MG/ML IV SOLN
1.0000 mg | INTRAVENOUS | Status: DC | PRN
Start: 2017-12-07 — End: 2017-12-07

## 2017-12-07 MED ORDER — METOPROLOL TARTRATE 5 MG/5ML IV SOLN
5.0000 mg | INTRAVENOUS | Status: DC | PRN
  Administered 2017-12-10 (×3): 5 mg via INTRAVENOUS
  Administered 2017-12-11: 10 mg via INTRAVENOUS
  Administered 2017-12-12 (×2): 5 mg via INTRAVENOUS
  Administered 2017-12-13: 10 mg via INTRAVENOUS
  Filled 2017-12-07: qty 5
  Filled 2017-12-07: qty 10
  Filled 2017-12-07: qty 5
  Filled 2017-12-07: qty 10
  Filled 2017-12-07 (×3): qty 5

## 2017-12-07 MED ORDER — SODIUM CHLORIDE 0.9% FLUSH
10.0000 mL | Freq: Two times a day (BID) | INTRAVENOUS | Status: DC
  Administered 2017-12-07 – 2017-12-10 (×6): 10 mL
  Administered 2017-12-10: 20 mL
  Administered 2017-12-11 – 2017-12-14 (×7): 10 mL

## 2017-12-07 MED ORDER — CHLORHEXIDINE GLUCONATE 0.12 % MT SOLN
15.0000 mL | Freq: Two times a day (BID) | OROMUCOSAL | Status: DC
  Administered 2017-12-08 – 2017-12-10 (×5): 15 mL via OROMUCOSAL
  Filled 2017-12-07 (×3): qty 15

## 2017-12-07 MED ORDER — ORAL CARE MOUTH RINSE
15.0000 mL | Freq: Two times a day (BID) | OROMUCOSAL | Status: DC
  Administered 2017-12-08 – 2017-12-10 (×6): 15 mL via OROMUCOSAL

## 2017-12-07 MED ORDER — CHLORHEXIDINE GLUCONATE CLOTH 2 % EX PADS
6.0000 | MEDICATED_PAD | Freq: Every day | CUTANEOUS | Status: DC
  Administered 2017-12-08 – 2017-12-11 (×4): 6 via TOPICAL

## 2017-12-07 NOTE — Progress Notes (Signed)
Pt HR in 130s, wide QRS periodiclly. Pt became restless, and stated he does not feel well. MD notified, he will assess pt shortly.

## 2017-12-07 NOTE — Progress Notes (Signed)
Hypoglycemic Event  CBG: 67  Treatment: D50 IV 25 mL  Symptoms: Pale  Follow-up CBG: Time:1222 CBG Result:94 Possible Reasons for Event: Inadequate meal intake  Comments/MD notified:Dr Adhikari at bedside     Luci Bank

## 2017-12-07 NOTE — Progress Notes (Signed)
Report called to RN on 4N.

## 2017-12-07 NOTE — Progress Notes (Signed)
In and out catheterization completed for acute urinary retention at the bedside [place] [facility]. RN performing procedure Lorenso Courier assisted by Cyril Mourning, C.  Perineal care completed, gown and bed pads changed, sterile technique maintained throughout procedure. Drained 850 ml of amber clear urine. Well tolerated by patient, pt verbalized relief. Will continue to monitor and treat patient per MD order.

## 2017-12-07 NOTE — Progress Notes (Signed)
PROGRESS NOTE    Jacob Shannon  KZS:[id number] DOB: 11/10/1940 DOA: 11/26/2017 PCP: Cyndi Bender, PA-C   Brief Narrative: Patient is a 77 year old male with past medical history of A. fib on Pradaxa, chronic diastolic CHF, nonobstructive coronary disease, chronic back pain lumbar spinal stenosis who presented to the emergency department from home  after he was brought by his family member for the evaluation of lethargy, somnolence and poor appetite.  Was reported that patient was recently started on Cymbalta and Ultram for chronic low back pain by his neurologist.  His family has been noticing very poor intake for the last few days.  No report of fever or chills at home .This morning he was found to be somnolent, irritable and was in A. fib with RVR.  Patient was also found to be in acute kidney injury.    Assessment & Plan:   Principal Problem:   Lethargy Active Problems:   Essential hypertension   Elevated troponin   Chronic diastolic CHF (congestive heart failure), NYHA class 2 (HCC)   Chronic atrial fibrillation (HCC)   Chronic back pain   AKI (acute kidney injury) ([place])  Altered mental status, lethargy, somnolence: Unknown etiology.  Presented with several days of lethargy, somnolence and poor appetite.  Head CT done in the emergency department was negative for any acute intracranial abnormalities and no neurological  deficits identified.  As per the report, his symptoms began shortly after starting on Cymbalta and Ultram for chronic back pain.  We have held his medications.  We will get the MRI of the brain to rule out ischemic stroke. We will continue to monitor his mental status.  We will get an ABG and ammonia level.  Patient is afebrile and he does not complain of headache so less suspicious for meningitis or encephalitis.  Acute kidney injury on CKD: His creatinine was 2.44 on admission.  His creatinine last year was 1.21.  Could be AKI due to poor oral intake.  Continue gentle  IV fluids.  Ultrasound of the kidneys did not show any abnormalities.  Will check urine chemistries.  Avoid nephrotoxins.  Started on bicarb tablets.  We will continue to monitor his BMP level.  Afib With RVR: Found to be in A. fib with RVR this morning.  On metoprolol at home.  On Pradaxa for anticoagulation.  We will start him on Cardizem drip. CHADS-VASc is at least 22 (age x2, CHF, CAD)  Chronic low back pain: Follows with neurology as an outpatient.  Chronic low back pain secondary to lumbar spine stenosis.  Recently started on Cymbalta and Ultram.  Continue  lidocaine patch and other supportive measures for back pain.  Chronic diastolic CHF: On diuretics at home.  Does not look volume overloaded.  No lower extremity edema.  His lungs are clear.  We will continue to monitor his input and output.  Continue beta-blocker.  Start on gentle IV fluids.  Elevated troponin: Troponin on 0.05.  No chest pain or shortness of breath.  Saturation is fine on room air.  ST abnormalities on EKG are not new finding.  He has history of minor nonobstructive coronary artery disease as  Per cath in 2017.  We will continue to trend the troponin and continue beta-blocker..     DVT prophylaxis:Pradaxa Code Status: Full Family Communication: Son present at the bedside Disposition Plan: Undetermined at this point  Consultants: None  Procedures: None   Antimicrobials: None  Subjective: Patient seen and examined at bedside this morning.  Continues to be irritable and uncomfortable.  Upon asking questions, he answers and obeys commands.  Denies any specific complaints but continues to toss and turn on the bed. Objective: Vitals:   12/07/17 0449 12/07/17 0500 12/07/17 0659 12/07/17 0739  BP: 135/72  (!) 152/94   Pulse: (!) 115  (!) 127   Resp: 18     Temp:  (!) 97.5 F (36.4 C)    TempSrc:  Oral    SpO2:  100%  100%  Weight:      Height:        Intake/Output Summary (Last 24 hours) at 12/07/2017  0818 Last data filed at 12/07/2017 0300 Gross per 24 hour  Intake 1396.26 ml  Output 850 ml  Net 546.26 ml   Filed Weights   11/25/2017 1127  Weight: 87.1 kg (192 lb)    Examination:  General exam: Generalized weakness, irritable HEENT:PERRL,Oral mucosa moist, Ear/Nose normal on gross exam Respiratory system: Bilateral equal air entry, normal vesicular breath sounds, no wheezes or crackles  Cardiovascular system: S1 & S2 heard, RRR. No JVD, murmurs, rubs, gallops or clicks. No pedal edema. Gastrointestinal system: Abdomen is nondistended, soft and nontender. No organomegaly or masses felt. Normal bowel sounds heard. Central nervous system: not Alert and oriented. No focal neurological deficits. Extremities: No edema, no clubbing ,no cyanosis, distal peripheral pulses palpable. Skin: Slightly mottled appearance of the skin, cool peripheries ,no rashes, lesions or ulcers,no icterus ,no pallor MSK: Normal muscle bulk,tone ,power Psychiatry: Judgement and insight appear impaired    Data Reviewed: I have personally reviewed following labs and imaging studies  CBC: Recent Labs  Lab 11/28/2017 1131  WBC 8.5  HGB 10.4*  HCT 35.7*  MCV 94.4  PLT 774   Basic Metabolic Panel: Recent Labs  Lab 11/29/2017 1131  NA 134*  K 4.3  CL 97*  CO2 16*  GLUCOSE 76  BUN 37*  CREATININE 2.44*  CALCIUM 9.4   GFR: Estimated Creatinine Clearance: 26.8 mL/min (A) (by C-G formula based on SCr of 2.44 mg/dL (H)). Liver Function Tests: No results for input(s): AST, ALT, ALKPHOS, BILITOT, PROT, ALBUMIN in the last 168 hours. No results for input(s): LIPASE, AMYLASE in the last 168 hours. No results for input(s): AMMONIA in the last 168 hours. Coagulation Profile: No results for input(s): INR, PROTIME in the last 168 hours. Cardiac Enzymes: Recent Labs  Lab 12/12/2017 1609 12/10/2017 2150  TROPONINI 0.05* 0.05*   BNP (last 3 results) No results for input(s): PROBNP in the last 8760  hours. HbA1C: No results for input(s): HGBA1C in the last 72 hours. CBG: No results for input(s): GLUCAP in the last 168 hours. Lipid Profile: No results for input(s): CHOL, HDL, LDLCALC, TRIG, CHOLHDL, LDLDIRECT in the last 72 hours. Thyroid Function Tests: No results for input(s): TSH, T4TOTAL, FREET4, T3FREE, THYROIDAB in the last 72 hours. Anemia Panel: No results for input(s): VITAMINB12, FOLATE, FERRITIN, TIBC, IRON, RETICCTPCT in the last 72 hours. Sepsis Labs: No results for input(s): PROCALCITON, LATICACIDVEN in the last 168 hours.  No results found for this or any previous visit (from the past 240 hour(s)).       Radiology Studies: Ct Head Wo Contrast  Result Date: 12/04/2017 CLINICAL DATA:  Worsening confusion, increasing thirst for a few days. Recurrent falls. History of atrial fibrillation, hypertension, hypercholesterolemia. EXAM: CT HEAD WITHOUT CONTRAST TECHNIQUE: Contiguous axial images were obtained from the base of the skull through the vertex without intravenous contrast. COMPARISON:  None. FINDINGS: BRAIN: No  intraparenchymal hemorrhage, mass effect nor midline shift. The ventricles and sulci are normal for age. Patchy supratentorial white matter hypodensities less than expected for patient's age, though non-specific are most compatible with chronic small vessel ischemic disease. 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 Old bilateral cerebellar infarcts. Beam hardening artifact through bilateral occipital lobes. No acute large vascular territory infarct. Punctate parenchymal calcifications. No abnormal extra-axial fluid collections. Basal cisterns are patent. VASCULAR: Moderate calcific atherosclerosis of the carotid siphons. SKULL: No skull fracture. No significant scalp soft tissue swelling. No sellar expansion. SINUSES/ORBITS: Mild paranasal sinus mucosal thickening with air-fluid levels. Mastoid air cells are well aerated.The included ocular globes and  orbital contents are non-suspicious. Status post bilateral ocular lens implants. OTHER: None. IMPRESSION: 1. No acute intracranial process. 2. 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 Old bilateral cerebellar infarcts. 3. Beam hardening artifact occipital lobes, unlikely to reflect stroke or PRES. Electronically Signed   By: Elon Alas M.D.   On: 12/08/2017 17:57   US Renal  Result Date: 12/05/2017 CLINICAL DATA:  Acute kidney injury.  History of hypertension. EXAM: RENAL / URINARY TRACT ULTRASOUND COMPLETE COMPARISON:  None. FINDINGS: Right Kidney: Length: 11 cm. Echogenicity within normal limits. No mass or hydronephrosis visualized. Mild cortical thinning. Left Kidney: Length: 11.7 cm. Echogenicity within normal limits. No mass or hydronephrosis visualized. Mild cortical thinning. Bladder: Appears normal for degree of bladder distention. Small volume anechoic perihepatic free fluid. IMPRESSION: 1. Mild bilateral cortical atrophy.  No obstructive uropathy. 2. Small volume ascites. Electronically Signed   By: Elon Alas M.D.   On: 11/16/2017 20:52   Dg Chest Port 1 View  Result Date: 12/12/2017 CLINICAL DATA:  Shortness of breath and weakness. Fusion. Lethargic. EXAM: PORTABLE CHEST 1 VIEW COMPARISON:  07/26/2017 FINDINGS: Chronic cardiomegaly. Pulmonary vascularity is normal in the lungs are clear except for minimal atelectasis at the lung bases laterally. No consolidative infiltrates or effusions. No acute bone abnormality. Aortic atherosclerosis. IMPRESSION: Chronic cardiomegaly. Minimal atelectasis at the lung bases laterally. Aortic Atherosclerosis (ICD10-I70.0). Electronically Signed   By: Lorriane Shire M.D.   On: 11/22/2017 15:59        Scheduled Meds: . dabigatran  150 mg Oral BID  . diltiazem  10 mg Intravenous Once  . docusate sodium  100 mg Oral Daily  . lidocaine  1 patch Transdermal Q24H  . metoprolol tartrate  12.5 mg Oral Daily  . metoprolol tartrate  25 mg  Oral QHS  . sodium bicarbonate  650 mg Oral TID   Continuous Infusions: . sodium chloride    . diltiazem (CARDIZEM) infusion       LOS: 1 day    Time spent: 45 mins.More than 50% of that time was spent in counseling and/or coordination of care.      Shelly Coss, MD [facility] Pager [phone]  If 7PM-7AM, please contact night-coverage [email or web address] Password [facility] 12/07/2017, 8:18 AM

## 2017-12-07 NOTE — Progress Notes (Signed)
Hypoglycemic Event  CBG: <10  Treatment: D50 IV 50 mL  Symptoms: Pale  Follow-up CBG: Time:1056 CBG Result:69  Possible Reasons for Event: Inadequate meal intake  Comments/MD notified:Dr Adhikari notified started on D50.9% NS @75  mL/hr    Luci Bank

## 2017-12-07 NOTE — Significant Event (Signed)
Rapid Response Event Note Pt with increase confusion, mottled extremities  Overview: Time Called: 1141 Arrival Time: 1145 Event Type: Neurologic  Initial Focused Assessment: On arrival pt lying supine in bed, restless, mottled extremities, skin cool and pale, alert to self, location and month. C/o abd pain. SBP 108, HR 70's a-fib, RR 16, Sats 80's-90's.   Interventions: Dr. Tawanna Solo to bedside, consult to PCCM No interventions from RRT. support to RN   Event Summary: Name of Physician Notified: Dr. Tawanna Solo  at (PTA RRT )  Name of Consulting Physician Notified: PCCM  at 1200  Outcome: Transferred (Comment)([id number])  Event End Time: [place], [place]

## 2017-12-07 NOTE — Progress Notes (Signed)
Pt HR sustains in 120s. MD notified.

## 2017-12-07 NOTE — Progress Notes (Signed)
PT Cancellation Note  Patient Details Name: Jacob Shannon MRN: [id number] DOB: 1941-03-14   Cancelled Treatment:    Reason Eval/Treat Not Completed: Medical issues which prohibited therapy.  Critical blood O2 levels and elevating troponin with elevated BP.  Will try at a later time to see how pt is progressing.   [name] 12/07/2017, 10:06 AM   Mee Hives, PT MS Acute Rehab Dept. Number: [place] and [place]

## 2017-12-07 NOTE — Progress Notes (Signed)
CRITICAL VALUE ALERT  Critical Value:  ABG CO2 19.8 Date & Time Notied:  12/07/2017 0930  Provider Notified:DR Adhikari notified

## 2017-12-07 NOTE — Consult Note (Signed)
PULMONARY / CRITICAL CARE MEDICINE   Name: Jacob Shannon MRN: [id number] DOB: 09-Dec-1940    ADMISSION DATE:  12/01/2017 CONSULTATION DATE: 12/07/17  REFERRING MD:  [facility] Dr. Myna Hidalgo   CHIEF COMPLAINT:  Hypoxic /lethargic   HISTORY OF PRESENT ILLNESS:   77 yo male admitted 11/18/2017 with lethargy and poor appetite . 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  Prior to admission he been started on Cymbalta and Ultram for chronic low back pain.  Symptoms seemed to begin shortly after taking this with lethargy somnolence and poor appetite.  On admission patient was found to be in A. fib.  CT had showed no acute intracranial abnormality but notable for old infarct.  Chest x-ray showed some bibasilar atelectasis and chronic   Cardiomegaly.  Patient did have worsening renal function with creatinine up at 2.44.    In the emergency room he was given a 500 cc normal saline bolus.  Renal US showed mild bilateral atrophy . Pt w/ low blood sugar this am , BS 10 , received D50 with improvement . Developed A FIB with RVR this am , started on Cardizem drip .  Increased O2 demands with sats marginal 88% on 2l/m O2 . Difficult IV stick, labs pending  PCCM consulted to evaluate. Pt remains confused and lethargic. BS is improved. B/P is stable. O2 sats 88-90% on 3l/m .    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  PAST SURGICAL HISTORY: He  has a past surgical history that includes Leg skin lesion  biopsy / excision (Right, 2010);  Colonoscopy; Cataract extraction (Bilateral); TEE without cardioversion (N/A, 08/25/2015); and Cardiac catheterization (N/A, 08/26/2015).  Allergies  Allergen Reactions  . Tramadol Nausea And Vomiting    No current facility-administered medications on file prior to encounter.    Current Outpatient Medications on File Prior to Encounter  Medication Sig  . colchicine 0.6 MG tablet Take 0.6 mg by mouth 2 (two) times daily.   . dabigatran (PRADAXA) 150 MG CAPS capsule Take 150 mg by mouth 2 (two) times daily.  Marland Kitchen docusate sodium (COLACE) 100 MG capsule Take 100 mg by mouth daily.  . DULoxetine (CYMBALTA) 30 MG capsule Take 30 mg by mouth daily.  . metoprolol tartrate (LOPRESSOR) 25 MG tablet Take 12.5-25 mg by mouth See admin instructions. Take 12.5 mg by mouth in the morning and 25 mg in the evening  . potassium chloride SA (K-DUR,KLOR-CON) 20 MEQ tablet Take 20 mEq by mouth 2 (two) times daily.  Marland Kitchen torsemide (DEMADEX) 20 MG tablet Take 40-60 mg by mouth See admin instructions. Take 60 mg by mouth in the morning and 40 mg midday for fluid/edema  . metoprolol succinate (TOPROL-XL) 25 MG 24 hr tablet Take 1 tablet (25 mg total) by mouth 3 (three) times daily. PLEASE CONTACT OFFICE FOR ADDITIONAL REFILLS (Patient not taking: Reported on 11/29/2017)    FAMILY HISTORY:  His indicated that his mother is deceased. He indicated that his father is deceased. He indicated that the status of his neg hx is unknown.   SOCIAL HISTORY: He  reports that he quit smoking about 23 years ago.  His smoking use included cigarettes. He started smoking about 61 years ago. He has a 57.00 pack-year smoking history. He has never used smokeless tobacco. He reports that he drinks alcohol. He reports that he does not use drugs.  REVIEW OF SYSTEMS:  Unable to obtain as pt is confused , reviewed from chart      SUBJECTIVE: Hypoxia/lethargy    VITAL SIGNS: BP (!) 109/58   Pulse 72   Temp (!) 97.5 F (36.4 C) (Oral)    Resp (!) 21   Ht 5\' 8"  (1.727 m)   Wt 192 lb (87.1 kg)   SpO2 (!) 84%   BMI 29.19 kg/m   HEMODYNAMICS:    VENTILATOR SETTINGS:    INTAKE / OUTPUT: I/O last 3 completed shifts: In: 1396.3 [I.V.:896.3; IV Piggyback:500] Out: 850 [Urine:850]  PHYSICAL EXAMINATION: General: Elderly gentleman, ill-appearing Neuro: Confused, alert, does not follow commands HEENT: Dry oral mucosa;  Cardiovascular: Irregular regular , tr edema  Lungs: Decreased breath sounds in the bases Abdomen: Obese soft hypoactive bowel sounds Musculoskeletal: Intact Skin:  Pale , mottled extremities   LABS:  BMET Recent Labs  Lab 11/24/2017 1131  NA 134*  K 4.3  CL 97*  CO2 16*  BUN 37*  CREATININE 2.44*  GLUCOSE 76    Electrolytes Recent Labs  Lab 11/24/2017 1131  CALCIUM 9.4    CBC Recent Labs  Lab 11/22/2017 1131 12/07/17 0928  WBC 8.5 13.1*  HGB 10.4* 11.2*  HCT 35.7* 40.0  PLT 175 150    Coag's No results for input(s): APTT, INR in the last 168 hours.  Sepsis Markers No results for input(s): LATICACIDVEN, PROCALCITON, O2SATVEN in the last 168 hours.  ABG Recent Labs  Lab 12/07/17 0920  PHART 7.352  PCO2ART 19.8*  PO2ART 113*    Liver Enzymes No results for input(s): AST, ALT, ALKPHOS, BILITOT, ALBUMIN in the last 168 hours.  Cardiac Enzymes Recent Labs  Lab 11/27/2017 1609 12/07/2017 2150  TROPONINI 0.05* 0.05*    Glucose Recent Labs  Lab 12/07/17 1019 12/07/17 1056 12/07/17 1148 12/07/17 1222 12/07/17 1305  GLUCAP <10* 69 67 94 94    Imaging Ct Head Wo Contrast  Result Date: 12/14/2017 CLINICAL DATA:  Worsening confusion, increasing thirst for a few days. Recurrent falls. History of atrial fibrillation, hypertension, hypercholesterolemia. EXAM: CT HEAD WITHOUT CONTRAST TECHNIQUE: Contiguous axial images were obtained from the base of the skull through the vertex without intravenous contrast. COMPARISON:  None. FINDINGS: BRAIN: No intraparenchymal  hemorrhage, mass effect nor midline shift. The ventricles and sulci are normal for age. Patchy supratentorial white matter hypodensities less than expected for patient's age, though non-specific are most compatible with chronic small vessel ischemic disease. 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 Old bilateral cerebellar infarcts. Beam hardening artifact through bilateral occipital lobes. No acute large vascular territory infarct. Punctate parenchymal calcifications. No abnormal extra-axial fluid collections. Basal cisterns are patent. VASCULAR: Moderate calcific atherosclerosis of the carotid siphons. SKULL: No skull fracture. No significant scalp soft tissue swelling. No sellar expansion. SINUSES/ORBITS: Mild paranasal sinus mucosal thickening with air-fluid levels. Mastoid air cells are well aerated.The included ocular globes and orbital contents are non-suspicious. Status post bilateral ocular lens implants. OTHER: None. IMPRESSION: 1. No acute intracranial process. 2. 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 Old bilateral cerebellar infarcts. 3. Beam hardening artifact occipital lobes, unlikely to reflect stroke or PRES. Electronically Signed   By: Elon Alas M.D.   On: 12/02/2017 17:57   US Renal  Result  Date: 12/03/2017 CLINICAL DATA:  Acute kidney injury.  History of hypertension. EXAM: RENAL / URINARY TRACT ULTRASOUND COMPLETE COMPARISON:  None. FINDINGS: Right Kidney: Length: 11 cm. Echogenicity within normal limits. No mass or hydronephrosis visualized. Mild cortical thinning. Left Kidney: Length: 11.7 cm. Echogenicity within normal limits. No mass or hydronephrosis visualized. Mild cortical thinning. Bladder: Appears normal for degree of bladder distention. Small volume anechoic perihepatic free fluid. IMPRESSION: 1. Mild bilateral cortical atrophy.  No obstructive uropathy. 2. Small volume ascites. Electronically Signed   By: Elon Alas M.D.   On: 11/27/2017 20:52    Dg Chest Port 1 View  Result Date: 11/18/2017 CLINICAL DATA:  Shortness of breath and weakness. Fusion. Lethargic. EXAM: PORTABLE CHEST 1 VIEW COMPARISON:  07/26/2017 FINDINGS: Chronic cardiomegaly. Pulmonary vascularity is normal in the lungs are clear except for minimal atelectasis at the lung bases laterally. No consolidative infiltrates or effusions. No acute bone abnormality. Aortic atherosclerosis. IMPRESSION: Chronic cardiomegaly. Minimal atelectasis at the lung bases laterally. Aortic Atherosclerosis (ICD10-I70.0). Electronically Signed   By: Lorriane Shire M.D.   On: 12/01/2017 15:59     STUDIES:  CT head 6/21 >>neg acute , old infarct   CULTURES: [facility] 6/22 >> UC 6/22>>  ANTIBIOTICS: 6/21 Vanc/Zosyn   SIGNIFICANT EVENTS: 6/21 admitted   LINES/TUBES:   DISCUSSION: 77 yo male with A FIb , Chronic DCHF and Pulmonary HTN admitted 12/07/17 with lethargy with development of A FIB w/ RVR and Hypoxia . Transfer to ICU   ASSESSMENT / PLAN:  PULMONARY A: Hypoxia  Pulmonary HTN  P:   Check CXR today  O2 to keep sats >90%   CARDIOVASCULAR A:  Afib w/ RVR  DCHf  P:  Cardizem Drip  Pradaxa , if unable to take oral , will need to change to HEP drip  Check Mg level   RENAL A:   Acute Renal Failure Metabolic Acidosis   P:   Avoid nephrotoxin rx  IVF hydration as able  Check lactate   GASTROINTESTINAL A:   Nausea  P:   Check LFT  Check CT abd /pelvis w/o contrast    HEMATOLOGIC A:   Anemia  P:  Tr CBC   INFECTIOUS A:   No apparent source of infection , if PCT /LA nml , consider d/c ABX .  P:   Check lactate , PCT  Tr wbc and temp  BC pending  Vanc/Zosyn day2   ENDOCRINE A:   Hypoglycemia  P:   Cont IVF D5 NS     NEUROLOGIC A:   Lethargic  P:   Avoid sedating rx     FAMILY  - Updates: no family at bedside   - Inter-disciplinary family meet or Palliative Care meeting due by:  day 7   Gabriela Irigoyen NP-C  Pulmonary and [place] Pager: [phone]  12/07/2017, 1:06 PM

## 2017-12-07 NOTE — Procedures (Signed)
Central Venous Catheter Insertion Procedure Note DAQUAWN SEELMAN [id number] 12-22-1940  Procedure: Insertion of Central Venous Catheter Indications: Drug and/or fluid administration and Frequent blood sampling  Procedure Details Consent: Risks of procedure as well as the alternatives and risks of each were explained to the (patient/caregiver).  Consent for procedure obtained. Time Out: Verified patient identification, verified procedure, site/side was marked, verified correct patient position, special equipment/implants available, medications/allergies/relevent history reviewed, required imaging and test results available.  Performed  Maximum sterile technique was used including antiseptics, cap, gloves, gown, hand hygiene, mask and sheet. Skin prep: Chlorhexidine; local anesthetic administered A antimicrobial bonded/coated triple lumen catheter was placed in the right internal jugular vein using the Seldinger technique.  Evaluation Blood flow good Complications: No apparent complications Patient did tolerate procedure well. Chest X-ray ordered to verify placement.  CXR: pending.  Caffie Damme 12/07/2017, 4:52 PM

## 2017-12-07 NOTE — Progress Notes (Signed)
Pt verbalized need to urinate, no output since admission. Bladder scan indicated 829 ml of urine in bladder. NP on call paged.

## 2017-12-08 ENCOUNTER — Inpatient Hospital Stay (HOSPITAL_COMMUNITY): Payer: Medicare Other

## 2017-12-08 DIAGNOSIS — I361 Nonrheumatic tricuspid (valve) insufficiency: Secondary | ICD-10-CM

## 2017-12-08 DIAGNOSIS — I341 Nonrheumatic mitral (valve) prolapse: Secondary | ICD-10-CM

## 2017-12-08 LAB — POCT I-STAT 3, ART BLOOD GAS (G3+)
Acid-Base Excess: 3 mmol/L — ABNORMAL HIGH (ref 0.0–2.0)
Bicarbonate: 26.7 mmol/L (ref 20.0–28.0)
O2 Saturation: 99 %
PCO2 ART: 35.1 mmHg (ref 32.0–48.0)
PH ART: 7.488 — AB (ref 7.350–7.450)
PO2 ART: 103 mmHg (ref 83.0–108.0)
Patient temperature: 97.7
TCO2: 28 mmol/L (ref 22–32)

## 2017-12-08 LAB — BASIC METABOLIC PANEL
Anion gap: 12 (ref 5–15)
BUN: 60 mg/dL — AB (ref 6–20)
CHLORIDE: 101 mmol/L (ref 101–111)
CO2: 26 mmol/L (ref 22–32)
Calcium: 8.6 mg/dL — ABNORMAL LOW (ref 8.9–10.3)
Creatinine, Ser: 3.15 mg/dL — ABNORMAL HIGH (ref 0.61–1.24)
GFR calc Af Amer: 20 mL/min — ABNORMAL LOW (ref >60)
GFR calc non Af Amer: 17 mL/min — ABNORMAL LOW (ref >60)
Glucose, Bld: 105 mg/dL — ABNORMAL HIGH (ref 65–99)
POTASSIUM: 3.4 mmol/L — AB (ref 3.5–5.1)
Sodium: 139 mmol/L (ref 135–145)

## 2017-12-08 LAB — GLUCOSE, CAPILLARY
GLUCOSE-CAPILLARY: 134 mg/dL — AB (ref 65–99)
GLUCOSE-CAPILLARY: 69 mg/dL (ref 65–99)
GLUCOSE-CAPILLARY: 75 mg/dL (ref 65–99)
GLUCOSE-CAPILLARY: 85 mg/dL (ref 65–99)
GLUCOSE-CAPILLARY: 91 mg/dL (ref 65–99)
GLUCOSE-CAPILLARY: 92 mg/dL (ref 65–99)
Glucose-Capillary: 83 mg/dL (ref 65–99)
Glucose-Capillary: 84 mg/dL (ref 65–99)
Glucose-Capillary: 90 mg/dL (ref 65–99)

## 2017-12-08 LAB — UREA NITROGEN, URINE: UREA NITROGEN UR: 443 mg/dL

## 2017-12-08 LAB — BLOOD GAS, ARTERIAL
Acid-base deficit: 7.2 mmol/L — ABNORMAL HIGH (ref 0.0–2.0)
Bicarbonate: 17.1 mmol/L — ABNORMAL LOW (ref 20.0–28.0)
Drawn by: 51702
O2 CONTENT: 2 L/min
O2 SAT: 96.2 %
PCO2 ART: 29.8 mmHg — AB (ref 32.0–48.0)
PH ART: 7.374 (ref 7.350–7.450)
Patient temperature: 97.8
pO2, Arterial: 117 mmHg — ABNORMAL HIGH (ref 83.0–108.0)

## 2017-12-08 LAB — PROCALCITONIN: PROCALCITONIN: 3.11 ng/mL

## 2017-12-08 LAB — TYPE AND SCREEN
ABO/RH(D): A POS
Antibody Screen: NEGATIVE

## 2017-12-08 LAB — HEPATIC FUNCTION PANEL
ALBUMIN: 2.6 g/dL — AB (ref 3.5–5.0)
ALK PHOS: 58 U/L (ref 38–126)
ALK PHOS: 55 U/L (ref 38–126)
ALT: 740 U/L — ABNORMAL HIGH (ref 17–63)
ALT: 739 U/L — AB (ref 17–63)
AST: 1269 U/L — ABNORMAL HIGH (ref 15–41)
AST: 1163 U/L — AB (ref 15–41)
Albumin: 2.7 g/dL — ABNORMAL LOW (ref 3.5–5.0)
BILIRUBIN INDIRECT: 3.6 mg/dL — AB (ref 0.3–0.9)
Bilirubin, Direct: 3.5 mg/dL — ABNORMAL HIGH (ref 0.1–0.5)
Bilirubin, Direct: 3.5 mg/dL — ABNORMAL HIGH (ref 0.1–0.5)
Indirect Bilirubin: 3.6 mg/dL — ABNORMAL HIGH (ref 0.3–0.9)
TOTAL PROTEIN: 6.1 g/dL — AB (ref 6.5–8.1)
TOTAL PROTEIN: 6.1 g/dL — AB (ref 6.5–8.1)
Total Bilirubin: 7.1 mg/dL — ABNORMAL HIGH (ref 0.3–1.2)
Total Bilirubin: 7.1 mg/dL — ABNORMAL HIGH (ref 0.3–1.2)

## 2017-12-08 LAB — CBC
HEMATOCRIT: 31.8 % — AB (ref 39.0–52.0)
HEMOGLOBIN: 9.3 g/dL — AB (ref 13.0–17.0)
MCH: 27.3 pg (ref 26.0–34.0)
MCHC: 29.2 g/dL — ABNORMAL LOW (ref 30.0–36.0)
MCV: 93.3 fL (ref 78.0–100.0)
Platelets: 125 10*3/uL — ABNORMAL LOW (ref 150–400)
RBC: 3.41 MIL/uL — AB (ref 4.22–5.81)
RDW: 19.1 % — ABNORMAL HIGH (ref 11.5–15.5)
WBC: 11.7 10*3/uL — ABNORMAL HIGH (ref 4.0–10.5)

## 2017-12-08 LAB — APTT: aPTT: 81 seconds — ABNORMAL HIGH (ref 24–36)

## 2017-12-08 LAB — LACTIC ACID, PLASMA
Lactic Acid, Venous: 2.5 mmol/L (ref 0.5–1.9)
Lactic Acid, Venous: 9.9 mmol/L (ref 0.5–1.9)

## 2017-12-08 LAB — DIC (DISSEMINATED INTRAVASCULAR COAGULATION) PANEL
FIBRINOGEN: 221 mg/dL (ref 210–475)
PROTHROMBIN TIME: 62.3 s — AB (ref 11.4–15.2)

## 2017-12-08 LAB — SODIUM, URINE, RANDOM: Sodium, Ur: <10 mmol/L

## 2017-12-08 LAB — PROTIME-INR
INR: 7.71
INR: 7.67
INR: 5.79 — AB
INR: 3.62
PROTHROMBIN TIME: 51.7 s — AB (ref 11.4–15.2)
Prothrombin Time: 35.8 seconds — ABNORMAL HIGH (ref 11.4–15.2)
Prothrombin Time: 64.4 seconds — ABNORMAL HIGH (ref 11.4–15.2)
Prothrombin Time: 64.6 seconds — ABNORMAL HIGH (ref 11.4–15.2)

## 2017-12-08 LAB — CREATININE, URINE, RANDOM: Creatinine, Urine: 106.87 mg/dL

## 2017-12-08 LAB — TROPONIN I: Troponin I: 0.12 ng/mL (ref <0.03)

## 2017-12-08 LAB — ECHOCARDIOGRAM COMPLETE
Height: 68 in
WEIGHTICAEL: 3167.57 [oz_av]

## 2017-12-08 LAB — DIC (DISSEMINATED INTRAVASCULAR COAGULATION)PANEL
D-Dimer, Quant: 8.67 ug/mL-FEU — ABNORMAL HIGH (ref 0.00–0.50)
INR: 7.37
Platelets: 111 10*3/uL — ABNORMAL LOW (ref 150–400)
Smear Review: NONE SEEN
aPTT: 86 seconds — ABNORMAL HIGH (ref 24–36)

## 2017-12-08 LAB — ABO/RH: ABO/RH(D): A POS

## 2017-12-08 LAB — BRAIN NATRIURETIC PEPTIDE: B Natriuretic Peptide: 1195.4 pg/mL — ABNORMAL HIGH (ref 0.0–100.0)

## 2017-12-08 MED ORDER — SODIUM CHLORIDE 0.9% IV SOLUTION
Freq: Once | INTRAVENOUS | Status: AC
  Administered 2017-12-08: 18:00:00 via INTRAVENOUS

## 2017-12-08 MED ORDER — VITAMIN K1 10 MG/ML IJ SOLN
10.0000 mg | Freq: Once | INTRAMUSCULAR | Status: AC
  Administered 2017-12-08: 10 mg via INTRAVENOUS
  Filled 2017-12-08: qty 1

## 2017-12-08 MED ORDER — TECHNETIUM TC 99M MEBROFENIN IV KIT
7.9100 | PACK | Freq: Once | INTRAVENOUS | Status: AC | PRN
  Administered 2017-12-08: 7.91 via INTRAVENOUS

## 2017-12-08 MED ORDER — PIPERACILLIN-TAZOBACTAM 3.375 G IVPB
3.3750 g | Freq: Three times a day (TID) | INTRAVENOUS | Status: DC
  Administered 2017-12-08 – 2017-12-13 (×17): 3.375 g via INTRAVENOUS
  Filled 2017-12-08 (×18): qty 50

## 2017-12-08 MED ORDER — FUROSEMIDE 10 MG/ML IJ SOLN
40.0000 mg | Freq: Two times a day (BID) | INTRAMUSCULAR | Status: AC
  Administered 2017-12-08 – 2017-12-09 (×2): 40 mg via INTRAVENOUS
  Filled 2017-12-08 (×2): qty 4

## 2017-12-08 MED ORDER — FUROSEMIDE 10 MG/ML IJ SOLN
40.0000 mg | Freq: Once | INTRAMUSCULAR | Status: AC
  Administered 2017-12-09: 40 mg via INTRAVENOUS
  Filled 2017-12-08: qty 4

## 2017-12-08 MED ORDER — FAMOTIDINE IN NACL 20-0.9 MG/50ML-% IV SOLN
20.0000 mg | Freq: Two times a day (BID) | INTRAVENOUS | Status: DC
  Administered 2017-12-08 – 2017-12-14 (×12): 20 mg via INTRAVENOUS
  Filled 2017-12-08 (×11): qty 50

## 2017-12-08 MED ORDER — SODIUM CHLORIDE 0.9% IV SOLUTION: Freq: Once | INTRAVENOUS | Status: DC

## 2017-12-08 NOTE — Progress Notes (Signed)
  Echocardiogram 2D Echocardiogram has been performed.  Jacob Shannon 12/08/2017, 9:03 AM

## 2017-12-08 NOTE — Progress Notes (Signed)
PCCM Interval Note   CT A/P with minimal cholelithiasis noted. RUQ U/S with cholelithiasis with gallbladder sludge, gallbladder wall thickening, and pericholecystic edema. PCT 2.63. LA 9.9. Zosyn re-started.   IR consulted for Chole Drain for source control however earlier in shift INR >10. Given 10 mg Vit K and 5 mg idarucizumab. INR now 3.62. Repeat INR ordered for 0800.   Hayden Pedro, AGACNP-BC [facility]  Pgr: [phone]  PCCM Pgr: [phone]

## 2017-12-08 NOTE — Progress Notes (Signed)
Patient ID: Jacob Shannon, male   DOB: [birth year], 77 y.o.   MRN: [id number]   Request for percutaneous cholecystostomy drain placement  HIDA today IMPRESSION: 1. Nonvisualization of the gallbladder and small bowel with no evidence of radiotracer within the common bile duct. Persistent activity noted throughout the liver. Findings support hepatocellular dysfunction, with depressed hepatic excretion of bile. 2. Acute cholecystitis is not excluded.  Chole drain approved per Dr Pascal Lux But INR 7.7 today  He spoke to CCM Pt is overall some better today Plan is to recheck pt in am Recheck INR

## 2017-12-08 NOTE — Progress Notes (Signed)
PT Cancellation Note  Patient Details Name: KENDARIOUS GUDINO MRN: [id number] DOB: 1941-06-12   Cancelled Treatment:    Reason Eval/Treat Not Completed: Patient at procedure or test/unavailable   Not in room;   Will follow up later today as time allows;  Otherwise, will follow up for PT as available;   Thank you,  Roney Marion, PT  Acute Rehabilitation Services Pager [phone] Office [phone]     [place]ta [place] 12/08/2017, 10:22 AM

## 2017-12-08 NOTE — Progress Notes (Addendum)
PULMONARY / CRITICAL CARE MEDICINE   Name: Jacob Shannon MRN: [id number] DOB: 08/09/1940    ADMISSION DATE:  12/07/2017 CONSULTATION DATE:  12/07/2017  REFERRING MD:  [facility] Dr. Myna Hidalgo  CHIEF COMPLAINT:  Hypoxic/lethargic   Brief HPI:   Jacob Shannon is a 77 y.o. male with new onset altered mental status who was admitted with A-fib RVR and required diltiazem infusion to control. Over the last 24-48hrs has become more lethargic and also complaining of gastric discomfort and pain. Laboratory assessment has been an issue as he has vasculitis and poor access. Additionally, the patient has a worsening renal function with metabolic acidosis on recent ABG.   Last 24 Hour Events: -CT A/P: no ischemic bowel changes, possible cholecystitis with cholelithiasis present -INR significantly elevated and treated with pradaxa reversal as well as Vit K IV -LA trending down  -LFTs trending down, most recent were significantly elevated  -VSS and oxygenating well on minimal supplemental O2 -Mental status still waxing and waning   PAST MEDICAL HISTORY :  He  has a past medical history of Chronic atrial fibrillation ([place]), Complication of anesthesia, Diastolic heart failure ([place]), Essential hypertension, Gout, Hypercholesterolemia, Leukocytoclastic vasculitis ([place]), Mitral regurgitation, Secondary pulmonary hypertension, Spinal stenosis, and 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  PAST SURGICAL HISTORY: He  has a past surgical history that includes Leg skin lesion  biopsy / excision (Right, 2010); Colonoscopy; Cataract extraction (Bilateral); TEE without cardioversion (N/A, 08/25/2015); and Cardiac catheterization (N/A, 08/26/2015).  Allergies  Allergen Reactions  . Tramadol Nausea And Vomiting    No current facility-administered medications on file prior to encounter.    Current Outpatient Medications on File Prior to Encounter  Medication Sig  . colchicine 0.6 MG tablet Take 0.6 mg by mouth 2  (two) times daily.   . dabigatran (PRADAXA) 150 MG CAPS capsule Take 150 mg by mouth 2 (two) times daily.  Marland Kitchen docusate sodium (COLACE) 100 MG capsule Take 100 mg by mouth daily.  . DULoxetine (CYMBALTA) 30 MG capsule Take 30 mg by mouth daily.  . metoprolol tartrate (LOPRESSOR) 25 MG tablet Take 12.5-25 mg by mouth See admin instructions. Take 12.5 mg by mouth in the morning and 25 mg in the evening  . potassium chloride SA (K-DUR,KLOR-CON) 20 MEQ tablet Take 20 mEq by mouth 2 (two) times daily.  Marland Kitchen torsemide (DEMADEX) 20 MG tablet Take 40-60 mg by mouth See admin instructions. Take 60 mg by mouth in the morning and 40 mg midday for fluid/edema  . metoprolol succinate (TOPROL-XL) 25 MG 24 hr tablet Take 1 tablet (25 mg total) by mouth 3 (three) times daily. PLEASE CONTACT OFFICE FOR ADDITIONAL REFILLS (Patient not taking: Reported on 11/21/2017)    FAMILY HISTORY:  His indicated that his mother is deceased. He indicated that his father is deceased. He indicated that the status of his neg hx is unknown.   SOCIAL HISTORY: He  reports that he quit smoking about 23 years ago. His smoking use included cigarettes. He started smoking about 61 years ago. He has a 57.00 pack-year smoking history. He has never used smokeless tobacco. He reports that he drinks alcohol. He reports that he does not use drugs.   SUBJECTIVE:  Patient still grimacing in pain and pointing toward abdomen, minimally conversive   VITAL SIGNS: BP 110/60   Pulse 74   Temp 97.8 F (36.6 C) (Axillary)   Resp 16   Ht 5\' 8"  (1.727 m)   Wt 89.8 kg (  197 lb 15.6 oz)   SpO2 100%   BMI 30.10 kg/m   HEMODYNAMICS: CVP:  [21 mmHg-27 mmHg] 22 mmHg  INTAKE / OUTPUT: I/O last 3 completed shifts: In: 2895 [I.V.:1852.5; IV Piggyback:1042.5] Out: 1250 [Urine:1250]  PHYSICAL EXAMINATION: Gen-Elderly gentleman laying in bed, mild distress and appears ill.  HEENT-Scleral icterus present OU , EOMI, less dry mucus membranes, no LAD  appreciated Resp-Coarse breath sounds with diminished air movement in the bases bilaterally. Unchanged from yesterday  Card-Irregularly irregular rhythm regular rate  Abdomen-Distended with diffuse tenderness to palpation and hypoactive bowel sounds Skin-Mottled appearance, jaundiced   LABS:  BMET Recent Labs  Lab 11/16/2017 1131 12/07/17 1733  NA 134* 134*  K 4.3 4.3  CL 97* 99*  CO2 16* 12*  BUN 37* 53*  CREATININE 2.44* 3.21*  GLUCOSE 76 101*    Electrolytes Recent Labs  Lab 11/22/2017 1131 12/07/17 1733  CALCIUM 9.4 9.2  MG  --  2.5*  PHOS  --  8.4*    CBC Recent Labs  Lab 12/07/17 0928 12/07/17 1733 12/08/17 0558  WBC 13.1* 12.3* 11.7*  HGB 11.2* 10.0* 9.3*  HCT 40.0 35.0* 31.8*  PLT 150 166 125*    Coag's Recent Labs  Lab 12/07/17 1733 12/08/17 0000  INR >10.00* 3.62    Sepsis Markers Recent Labs  Lab 12/07/17 1733 12/07/17 2102 12/08/17 0000 12/08/17 0558  LATICACIDVEN 11.7* 10.4* 9.9*  --   PROCALCITON 2.63  --   --  3.11    ABG Recent Labs  Lab 12/07/17 0920 12/07/17 2235 12/08/17 0505  PHART 7.352 7.291* 7.374  PCO2ART 19.8* 22.8* 29.8*  PO2ART 113* 148* 117*    Liver Enzymes Recent Labs  Lab 12/07/17 1733  AST 456*  ALT 270*  ALKPHOS 57  BILITOT 5.9*  ALBUMIN 2.9*    Cardiac Enzymes Recent Labs  Lab 12/09/2017 1609 12/04/2017 2150 12/07/17 1733  TROPONINI 0.05* 0.05* 0.18*    Glucose Recent Labs  Lab 12/07/17 2335 12/08/17 0010 12/08/17 0012 12/08/17 0013 12/08/17 0324 12/08/17 0810  GLUCAP 48* 69 84 134* 83 92    Imaging Ct Abdomen Pelvis Wo Contrast  Result Date: 12/07/2017 CLINICAL DATA:  Nausea, vomiting. EXAM: CT ABDOMEN AND PELVIS WITHOUT CONTRAST TECHNIQUE: Multidetector CT imaging of the abdomen and pelvis was performed following the standard protocol without IV contrast. COMPARISON:  CT scan of May 09, 2015. FINDINGS: Lower chest: Minimal bilateral pleural effusions are noted with adjacent  subsegmental atelectasis. Hepatobiliary: Minimal cholelithiasis is noted. Liver is unremarkable on these unenhanced images. No biliary dilatation is noted. There may be gallbladder wall thickening. Pancreas: Unremarkable. No pancreatic ductal dilatation or surrounding inflammatory changes. Spleen: Mild amount of free fluid is noted around the spleen. Adrenals/Urinary Tract: Adrenal glands appear normal. Nonobstructive right renal calculus is noted. No hydronephrosis or renal obstruction is noted. Urinary bladder is unremarkable. Stomach/Bowel: The stomach is unremarkable. Status post appendectomy. There is no evidence of bowel obstruction. Vascular/Lymphatic: Aortic atherosclerosis. No enlarged abdominal or pelvic lymph nodes. Reproductive: Prostate is unremarkable. Other: Small left inguinal hernia is noted. Minimal amount of free fluid is noted in the pericolic gutters and pelvis. Musculoskeletal: No acute or significant osseous findings. IMPRESSION: Minimal bilateral pleural effusions with adjacent subsegmental atelectasis. Minimal cholelithiasis is noted. Gallbladder wall thickening cannot be excluded and further evaluation with ultrasound is recommended evaluate for possible cholecystitis. Minimal ascites is noted. Nonobstructive right renal calculus. No hydronephrosis or renal obstruction is noted. Small left inguinal hernia. Aortic Atherosclerosis (ICD10-I70.0). Electronically  Signed   By: Marijo Conception, M.D.   On: 12/07/2017 19:38   Dg Chest Port 1 View  Result Date: 12/07/2017 CLINICAL DATA:  Central line placement. EXAM: PORTABLE CHEST 1 VIEW COMPARISON:  12/08/2017 at 1356 hours FINDINGS: New right central venous line has been inserted through the right internal jugular vein. Tip projects over the upper right heart, likely within the right atrium. No pneumothorax.  No other change from the prior study. IMPRESSION: Right internal jugular central venous line tip projects within the right atrium. Caval  atrial junction likely resides 2-3 cm above this. No pneumothorax. Electronically Signed   By: Lajean Manes M.D.   On: 12/07/2017 17:29   Dg Chest Port 1v Same Day  Result Date: 12/07/2017 CLINICAL DATA:  Hypoxia. EXAM: PORTABLE CHEST 1 VIEW COMPARISON:  11/18/2017 FINDINGS: The cardiac silhouette remains prominently enlarged. Aortic atherosclerosis is noted. The lungs remain mildly hypoinflated with minimal atelectasis in both lung bases, similar to the prior study. No edema, sizable pleural effusion, or pneumothorax is identified. IMPRESSION: Unchanged cardiomegaly and minimal bibasilar atelectasis. Electronically Signed   By: Logan Bores M.D.   On: 12/07/2017 14:25   US Abdomen Limited Ruq  Result Date: 12/07/2017 CLINICAL DATA:  Elevated liver functions with elevated bilirubin, alkaline phosphatase, and lactic acid. History of hypertension. EXAM: ULTRASOUND ABDOMEN LIMITED RIGHT UPPER QUADRANT COMPARISON:  CT abdomen and pelvis 12/07/2017 FINDINGS: Gallbladder: Cholelithiasis with small stones measuring up to 6 mm diameter. Layering sludge in the gallbladder. There is diffuse gallbladder wall thickness and pericholecystic edema. Murphy's sign is negative. Common bile duct: Diameter: 4.8 mm, normal Liver: No focal lesion identified. Within normal limits in parenchymal echogenicity. Portal vein is patent on color Doppler imaging. During real-time imaging, there is evidence of reversal of flow direction with to and fro flow identified. Small amount of free fluid in the upper abdomen, likely ascites. IMPRESSION: 1. Cholelithiasis with gallbladder sludge, gallbladder wall thickening, and pericholecystic edema. Negative Murphy's sign. Changes are nonspecific and could be due to cholecystitis, liver disease, ascites, or heart failure. 2. Intermittent reversal of flow demonstrated in the portal veins. This may be due to liver disease or cirrhosis. 3. Mild ascites. Electronically Signed   By: Lucienne Capers  M.D.   On: 12/07/2017 23:23     STUDIES:  CTH 6/21>>NAICA, old infarct CT A/P 6/22>>Mild ascites with cholelithiasis and possible sludge  HIDA 6/23>>1. Nonvisualization of the gallbladder and small bowel with no evidence of radiotracer within the common bile duct. Persistent activity noted throughout the liver. Findings support hepatocellular dysfunction, with depressed hepatic excretion of bile. 2. Acute cholecystitis is not excluded.   CULTURES: BCx 6/22>>NGTD  ANTIBIOTICS: Vancomycin 6/21>>6/22 Zosyn 6/21>>>  SIGNIFICANT EVENTS: -INR elevated >10 and treated with Vit K/Praxbind. Repeat 3>>>7 -LFTs continue to worsen  -IR consulted and amendable to doing percutaneous drain once coagulopathy fixed.   LINES/TUBES: CVC RIJ 6/22  DISCUSSION: Jacob Shannon is a 77 y.o. male with acute hepatic failure with unclear etiology and associated AKI who initially presented with A-fib with RVR. Significantly coagulopathic that was reversed with Vit K and Praxbind. Continues to have worsening LFTs and underwent CT of A/P that revealed possible cholecystitis that was then confirmed with HIDA scan. IR on board and may take down for percutaneous drainage once INR improves. Receiving FFP and reaching out to Hepatology for further assistance in workup/managment. Hemodynamically remains stable with MAP 80-90 and only on minimal supplemental oxygen.   ASSESSMENT / PLAN:  PULMONARY A: Minimally hypoxic  P:   -[place] O2 to keep saturations >92% -CXR PRN   CARDIOVASCULAR A:  Afib with RVR, resolved  dCHF with elevated CVP throughout the day  P:  -Continue metoprolol  -Holding pradaxa as patient is coagulopathic  -Replacing mag and K as appropriate  -Giving two doses of lasix 40mg  IV (PM and AM)   RENAL A:   AKI with metabolic acidosis P:   -Cr mildly improving -Will monitor after first dose of lasix  -Replacing lytes as appropriate   GASTROINTESTINAL A:   Acute liver failure,  unclear etiology  Cholecystitis  P:   -IR consulted and will perform percutaneous drainage once INR is corrected (or if patient acutely decompensates)  -GI consulted for additional recommendations for workup and management of liver failure, appreciate assistance  -Checking LFTs and trending   HEMATOLOGIC A:   Coagulopathy  P:  -DIC panel pending  -Giving FFP -Continuing to monitor INR and CBCs   INFECTIOUS A:   Cholecystitis  P:   -Trending LA -Continue Zosyn  -IR to do per drain as above  -Hepatitis panel pending   ENDOCRINE A:   Hypoglycemia, resolved    P:   -Continue to monitor and will give IV D10 PRN   NEUROLOGIC A:   AMS P:   -Monitoring liver function  -Avoid sedating medications    FAMILY  - Updates:  Family updated and aware of most recent plan for possible IR drainage on 6/23 - Inter-disciplinary family meet or Palliative Care meeting due by:  6/25  Caffie Damme, MD Pulmonary and [place] Pager: [phone]  12/08/2017, 8:45 AM

## 2017-12-08 NOTE — Progress Notes (Signed)
eLink Physician-Brief Progress Note Patient Name: Jacob Shannon DOB: 01-21-1941 MRN: [id number]   Date of Service  12/08/2017  HPI/Events of Note  INR = 5.79.  eICU Interventions  Will order: 1. Vitamin K 10 mg IV now. 2. FFP 3 units IV now. 3. CBC, PT/INR and PTT in AM.     Intervention Category Intermediate Interventions: Coagulopathy - evaluation and management  [name] 12/08/2017, 8:06 PM

## 2017-12-08 NOTE — Progress Notes (Signed)
Pharmacy Antibiotic Note  Jacob Shannon is a 77 y.o. male admitted on 11/29/2017 with intra-abdominal infection.  Pharmacy has been consulted for Zosyn dosing. WBC mildly elevated. Renal dysfunction noted.   Plan: Zosyn 3.375G IV q8h to be infused over 4 hours Trend WBC, temp, renal function  F/U infectious work-up  Height: 5\' 8"  (172.7 cm) Weight: 192 lb (87.1 kg) IBW/kg (Calculated) : 68.4  Temp (24hrs), Avg:97.4 F (36.3 C), Min:96.7 F (35.9 C), Max:97.7 F (36.5 C)  Recent Labs  Lab 12/04/2017 1131 12/07/17 0928 12/07/17 1733 12/07/17 2102 12/08/17 0000  WBC 8.5 13.1* 12.3*  --   --   CREATININE 2.44*  --  3.21*  --   --   LATICACIDVEN  --   --  11.7* 10.4* 9.9*    Estimated Creatinine Clearance: 20.4 mL/min (A) (by C-G formula based on SCr of 3.21 mg/dL (H)).    Allergies  Allergen Reactions  . Tramadol Nausea And Vomiting   Narda Bonds 12/08/2017 2:28 AM

## 2017-12-08 NOTE — Progress Notes (Signed)
Hypoglycemic Event  CBG: 48  Treatment: D50 IV 50 mL  Symptoms: Pale  Follow-up CBG: Time:0010 CBG Result:84   Possible Reasons for Event: Inadequate meal intake  Comments/MD notified: ELink aware - finger sticks low. After follow up finger stick result of 84, blood drawn from CVC, result was 134. Will check remaining CBG via line    Andrey Farmer

## 2017-12-09 ENCOUNTER — Telehealth (HOSPITAL_COMMUNITY): Payer: Self-pay

## 2017-12-09 ENCOUNTER — Inpatient Hospital Stay (HOSPITAL_COMMUNITY): Payer: Medicare Other

## 2017-12-09 ENCOUNTER — Encounter (HOSPITAL_COMMUNITY): Payer: Self-pay | Admitting: Interventional Radiology

## 2017-12-09 HISTORY — PX: IR PERC CHOLECYSTOSTOMY: IMG2326

## 2017-12-09 LAB — CBC WITH DIFFERENTIAL/PLATELET
ABS IMMATURE GRANULOCYTES: 0 10*3/uL (ref 0.0–0.1)
BASOS ABS: 0 10*3/uL (ref 0.0–0.1)
BASOS PCT: 0 %
Eosinophils Absolute: 0.1 10*3/uL (ref 0.0–0.7)
Eosinophils Relative: 1 %
HCT: 28.8 % — ABNORMAL LOW (ref 39.0–52.0)
HEMOGLOBIN: 8.9 g/dL — AB (ref 13.0–17.0)
IMMATURE GRANULOCYTES: 1 %
LYMPHS PCT: 5 %
Lymphs Abs: 0.3 10*3/uL — ABNORMAL LOW (ref 0.7–4.0)
MCH: 27.8 pg (ref 26.0–34.0)
MCHC: 30.9 g/dL (ref 30.0–36.0)
MCV: 90 fL (ref 78.0–100.0)
Monocytes Absolute: 0.9 10*3/uL (ref 0.1–1.0)
Monocytes Relative: 13 %
NEUTROS ABS: 5.2 10*3/uL (ref 1.7–7.7)
NEUTROS PCT: 80 %
PLATELETS: 93 10*3/uL — AB (ref 150–400)
RBC: 3.2 MIL/uL — ABNORMAL LOW (ref 4.22–5.81)
RDW: 18.6 % — ABNORMAL HIGH (ref 11.5–15.5)
WBC: 6.5 10*3/uL (ref 4.0–10.5)

## 2017-12-09 LAB — PREPARE FRESH FROZEN PLASMA
Unit division: 0
Unit division: 0

## 2017-12-09 LAB — HEPATIC FUNCTION PANEL
ALK PHOS: 59 U/L (ref 38–126)
ALT: 658 U/L — ABNORMAL HIGH (ref 17–63)
ALT: 620 U/L — ABNORMAL HIGH (ref 17–63)
AST: 882 U/L — AB (ref 15–41)
AST: 740 U/L — ABNORMAL HIGH (ref 15–41)
Albumin: 2.8 g/dL — ABNORMAL LOW (ref 3.5–5.0)
Albumin: 2.9 g/dL — ABNORMAL LOW (ref 3.5–5.0)
Alkaline Phosphatase: 57 U/L (ref 38–126)
BILIRUBIN DIRECT: 3.6 mg/dL — AB (ref 0.1–0.5)
BILIRUBIN INDIRECT: 4.8 mg/dL — AB (ref 0.3–0.9)
BILIRUBIN TOTAL: 7.4 mg/dL — AB (ref 0.3–1.2)
BILIRUBIN TOTAL: 8.8 mg/dL — AB (ref 0.3–1.2)
Bilirubin, Direct: 4 mg/dL — ABNORMAL HIGH (ref 0.1–0.5)
Indirect Bilirubin: 3.8 mg/dL — ABNORMAL HIGH (ref 0.3–0.9)
Total Protein: 6.3 g/dL — ABNORMAL LOW (ref 6.5–8.1)
Total Protein: 6.3 g/dL — ABNORMAL LOW (ref 6.5–8.1)

## 2017-12-09 LAB — BPAM FFP
BLOOD PRODUCT EXPIRATION DATE: 201906232359
Blood Product Expiration Date: 201906232359
Blood Product Expiration Date: 201906232359
Blood Product Expiration Date: 201906232359
ISSUE DATE / TIME: 201906231553
ISSUE DATE / TIME: 201906232049
ISSUE DATE / TIME: 201906232049
ISSUE DATE / TIME: 201906232049
UNIT TYPE AND RH: 6200
Unit Type and Rh: 6200
Unit Type and Rh: 6200
Unit Type and Rh: 6200

## 2017-12-09 LAB — GLUCOSE, CAPILLARY
GLUCOSE-CAPILLARY: 87 mg/dL (ref 65–99)
GLUCOSE-CAPILLARY: 82 mg/dL (ref 65–99)
GLUCOSE-CAPILLARY: 96 mg/dL (ref 65–99)
Glucose-Capillary: 91 mg/dL (ref 65–99)
Glucose-Capillary: 91 mg/dL (ref 65–99)

## 2017-12-09 LAB — GRAM STAIN: Gram Stain: NONE SEEN

## 2017-12-09 LAB — URINE CULTURE: CULTURE: NO GROWTH

## 2017-12-09 LAB — BASIC METABOLIC PANEL
Anion gap: 13 (ref 5–15)
BUN: 53 mg/dL — ABNORMAL HIGH (ref 6–20)
CALCIUM: 8.4 mg/dL — AB (ref 8.9–10.3)
CO2: 30 mmol/L (ref 22–32)
Chloride: 100 mmol/L — ABNORMAL LOW (ref 101–111)
Creatinine, Ser: 2.39 mg/dL — ABNORMAL HIGH (ref 0.61–1.24)
GFR calc Af Amer: 28 mL/min — ABNORMAL LOW (ref >60)
GFR calc non Af Amer: 24 mL/min — ABNORMAL LOW (ref >60)
GLUCOSE: 94 mg/dL (ref 65–99)
Potassium: 2.9 mmol/L — ABNORMAL LOW (ref 3.5–5.1)
Sodium: 143 mmol/L (ref 135–145)

## 2017-12-09 LAB — FOLATE RBC
FOLATE, HEMOLYSATE: 528.3 ng/mL
FOLATE, RBC: 1601 ng/mL (ref >498)
Hematocrit: 33 % — ABNORMAL LOW (ref 37.5–51.0)

## 2017-12-09 LAB — LACTIC ACID, PLASMA: LACTIC ACID, VENOUS: 2.1 mmol/L — AB (ref 0.5–1.9)

## 2017-12-09 LAB — PROTIME-INR
INR: 4.38 — AB
INR: 3.59
Prothrombin Time: 41.6 seconds — ABNORMAL HIGH (ref 11.4–15.2)
Prothrombin Time: 35.6 seconds — ABNORMAL HIGH (ref 11.4–15.2)

## 2017-12-09 LAB — HEPATITIS PANEL, ACUTE
HEP A IGM: NEGATIVE
Hep B C IgM: NEGATIVE
Hepatitis B Surface Ag: NEGATIVE

## 2017-12-09 LAB — PROCALCITONIN: Procalcitonin: 2.11 ng/mL

## 2017-12-09 LAB — APTT: APTT: 52 s — AB (ref 24–36)

## 2017-12-09 LAB — AMMONIA: AMMONIA: 52 umol/L — AB (ref 9–35)

## 2017-12-09 MED ORDER — IOPAMIDOL (ISOVUE-300) INJECTION 61%
INTRAVENOUS | Status: AC
  Administered 2017-12-09: 10 mL
  Filled 2017-12-09: qty 50

## 2017-12-09 MED ORDER — SODIUM CHLORIDE 0.9% IV SOLUTION
Freq: Once | INTRAVENOUS | Status: AC
  Administered 2017-12-09: 06:00:00 via INTRAVENOUS

## 2017-12-09 MED ORDER — MIDAZOLAM HCL 2 MG/2ML IJ SOLN
INTRAMUSCULAR | Status: AC | PRN
  Administered 2017-12-09: 0.5 mg via INTRAVENOUS

## 2017-12-09 MED ORDER — LIDOCAINE-EPINEPHRINE (PF) 1 %-1:200000 IJ SOLN
INTRAMUSCULAR | Status: AC
  Filled 2017-12-09: qty 30

## 2017-12-09 MED ORDER — POTASSIUM CHLORIDE 10 MEQ/50ML IV SOLN
10.0000 meq | INTRAVENOUS | Status: AC
  Administered 2017-12-09 (×6): 10 meq via INTRAVENOUS
  Filled 2017-12-09 (×7): qty 50

## 2017-12-09 MED ORDER — LIDOCAINE-EPINEPHRINE (PF) 2 %-1:200000 IJ SOLN
INTRAMUSCULAR | Status: AC | PRN
  Administered 2017-12-09: 10 mL

## 2017-12-09 MED ORDER — FENTANYL CITRATE (PF) 100 MCG/2ML IJ SOLN
INTRAMUSCULAR | Status: AC
  Filled 2017-12-09: qty 2

## 2017-12-09 MED ORDER — SODIUM CHLORIDE 0.9 % IV SOLN
INTRAVENOUS | Status: DC | PRN
  Administered 2017-12-09 – 2017-12-11 (×3): 500 mL via INTRAVENOUS
  Administered 2017-12-12 – 2017-12-13 (×2): 1000 mL via INTRAVENOUS
  Administered 2017-12-13: 250 mL via INTRAVENOUS

## 2017-12-09 MED ORDER — FENTANYL CITRATE (PF) 100 MCG/2ML IJ SOLN
INTRAMUSCULAR | Status: AC | PRN
  Administered 2017-12-09 (×2): 25 ug via INTRAVENOUS

## 2017-12-09 MED ORDER — SODIUM CHLORIDE 0.9% FLUSH
5.0000 mL | Freq: Three times a day (TID) | INTRAVENOUS | Status: DC
  Administered 2017-12-09 – 2017-12-15 (×17): 5 mL

## 2017-12-09 MED ORDER — MIDAZOLAM HCL 2 MG/2ML IJ SOLN
INTRAMUSCULAR | Status: AC
  Filled 2017-12-09: qty 2

## 2017-12-09 NOTE — Consult Note (Addendum)
Chief Complaint: Patient was seen in consultation today for percutaneous cholecystostomy drain placement Chief Complaint  Patient presents with  . Weakness   at the request of Dr Lake Bells   Supervising Physician: Corrie Mckusick  Patient Status: [facility] - In-pt  History of Present Illness: Jacob Shannon is a 77 y.o. male   Admitted with AMS Lethargic and complains of RUQ pain Elevated LFTs; elevated TB  Imaging does reveal cholecystitis Reviewed with Dr Pascal Lux-- needed HIDA Was done yesterday and IMPRESSION: 1. Nonvisualization of the gallbladder and small bowel with no evidence of radiotracer within the common bile duct. Persistent activity noted throughout the liver. Findings support hepatocellular dysfunction, with depressed hepatic excretion of bile. 2. Acute cholecystitis is not excluded.  Percutaneous cholecystostomy was approved with Dr Pascal Lux He discussed with CCM INR (7.71) preferably to be more normalized before we move forward CCM agreeable  This am INR this am 4.38 Now has had FFP and Vit K Repeat INR pending     Past Medical History:  Diagnosis Date  . Chronic atrial fibrillation ([place])   . Complication of anesthesia    pt states that  "i'm hard to wake up"  . Diastolic heart failure ([place])   . Essential hypertension   . Gout   . Hypercholesterolemia   . Leukocytoclastic vasculitis ([place])   . Mitral regurgitation    Moderate August 2016  . Secondary pulmonary hypertension    RVSP 49 mmHg August 2016  . Spinal stenosis   . 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 CARDIAC CATHETERIZATION N/A 08/26/2015   Procedure: Right/Left Heart Cath and Coronary Angiography;  Surgeon: Peter M Martinique, MD;  Location: [facility];  Service: Cardiovascular;  Laterality: N/A;  . CATARACT EXTRACTION Bilateral   . COLONOSCOPY    . LEG SKIN LESION  BIOPSY / EXCISION Right 2010  .  TEE WITHOUT CARDIOVERSION N/A 08/25/2015   Procedure: TRANSESOPHAGEAL ECHOCARDIOGRAM (TEE);  Surgeon: Sanda Klein, MD;  Location: [facility];  Service: Cardiovascular;  Laterality: N/A;    Allergies: Tramadol  Medications: Prior to Admission medications   Medication Sig Start Date End Date Taking? Authorizing Provider  colchicine 0.6 MG tablet Take 0.6 mg by mouth 2 (two) times daily.    Yes [provider]  dabigatran (PRADAXA) 150 MG CAPS capsule Take 150 mg by mouth 2 (two) times daily.   Yes [provider]  docusate sodium (COLACE) 100 MG capsule Take 100 mg by mouth daily.   Yes [provider]  DULoxetine (CYMBALTA) 30 MG capsule Take 30 mg by mouth daily.   Yes [provider]  metoprolol tartrate (LOPRESSOR) 25 MG tablet Take 12.5-25 mg by mouth See admin instructions. Take 12.5 mg by mouth in the morning and 25 mg in the evening   Yes [provider]  potassium chloride SA (K-DUR,KLOR-CON) 20 MEQ tablet Take 20 mEq by mouth 2 (two) times daily.   Yes [provider]  torsemide (DEMADEX) 20 MG tablet Take 40-60 mg by mouth See admin instructions. Take 60 mg by mouth in the morning and 40 mg midday for fluid/edema 11/19/16  Yes Martinique, Peter M, MD  metoprolol succinate (TOPROL-XL) 25 MG 24 hr tablet Take 1 tablet (25 mg total) by mouth 3 (three) times daily. PLEASE CONTACT OFFICE FOR ADDITIONAL REFILLS Patient not taking: Reported on 12/11/2017 09/11/16   Martinique, Peter  M, MD     Family History  Problem Relation Age of Onset  . Heart attack Father   . Coronary artery disease Father   . Colon cancer Brother   . Coronary artery disease Brother   . Stomach cancer Brother   . Lung disease Neg Hx   . Rheumatologic disease Neg Hx     Social History   Socioeconomic History  . Marital status: Married    Spouse name: Not on file  . Number of children: 2  . Years of education: Not on file  . Highest education level: Not on file    Occupational History  . Occupation: self employed     Comment: [employer] in  Social Needs  . Financial resource strain: Not on file  . Food insecurity:    Worry: Not on file    Inability: Not on file  . Transportation needs:    Medical: Not on file    Non-medical: Not on file  Tobacco Use  . Smoking status: Former Smoker    Packs/day: 1.50    Years: 38.00    Pack years: 57.00    Types: Cigarettes    Start date: 06/18/1956    Last attempt to quit: 06/18/1994    Years since quitting: 23.4  . Smokeless tobacco: Never Used  Substance and Sexual Activity  . Alcohol use: Yes    Alcohol/week: 0.0 oz    Comment: Beer on a weekly basis.  . Drug use: No  . Sexual activity: Not on file  Lifestyle  . Physical activity:    Days per week: Not on file    Minutes per session: Not on file  . Stress: Not on file  Relationships  . Social connections:    Talks on phone: Not on file    Gets together: Not on file    Attends religious service: Not on file    Active member of club or organization: Not on file    Attends meetings of clubs or organizations: Not on file    Relationship status: Not on file  Other Topics Concern  . Not on file  Social History Narrative   Originally from [place]. Always lived in [place]. Prior travel to [facility]. 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 No pets currently. No bird, asbestos, mold, or hot tub exposure. He enjoys fishing.     Review of Systems: A 12 point ROS discussed and pertinent positives are indicated in the HPI above.  All other systems are negative.  Review of Systems  Constitutional: Positive for activity change and appetite change. Negative for fever.  Respiratory: Negative for shortness of breath.   Cardiovascular: Negative for chest pain.  Gastrointestinal: Positive for abdominal pain and nausea.  Psychiatric/Behavioral: Positive for confusion.     Vital Signs: BP (!) 143/80   Pulse (!) 104   Temp 97.8 F (36.6 C) (Axillary)   Resp 15   Ht 5\' 8"  (1.727 m)   Wt 196 lb 3.4 oz (89 kg)   SpO2 98%   BMI 29.83 kg/m   Physical Exam  Constitutional:  Ill appearing older man Lying in bed Asleep-- hard to arouse  Cardiovascular: Normal rate and regular rhythm.  Pulmonary/Chest: Effort normal.  Abdominal: Soft.  Skin: Skin is warm and dry.  Psychiatric:  Consented with son at bedside  Vitals reviewed.   Imaging: Ct Abdomen  Pelvis Wo Contrast  Result Date: 12/07/2017 CLINICAL DATA:  Nausea, vomiting. EXAM: CT ABDOMEN AND PELVIS WITHOUT CONTRAST TECHNIQUE: Multidetector CT imaging of the abdomen and pelvis was performed following the standard protocol without IV contrast. COMPARISON:  CT scan of May 09, 2015. FINDINGS: Lower chest: Minimal bilateral pleural effusions are noted with adjacent subsegmental atelectasis. Hepatobiliary: Minimal cholelithiasis is noted. Liver is unremarkable on these unenhanced images. No biliary dilatation is noted. There may be gallbladder wall thickening. Pancreas: Unremarkable. No pancreatic ductal dilatation or surrounding inflammatory changes. Spleen: Mild amount of free fluid is noted around the spleen. Adrenals/Urinary Tract: Adrenal glands appear normal. Nonobstructive right renal calculus is noted. No hydronephrosis or renal obstruction is noted. Urinary bladder is unremarkable. Stomach/Bowel: The stomach is unremarkable. Status post appendectomy. There is no evidence of bowel obstruction. Vascular/Lymphatic: Aortic atherosclerosis. No enlarged abdominal or pelvic lymph nodes. Reproductive: Prostate is unremarkable. Other: Small left inguinal hernia is noted. Minimal amount of free fluid is noted in the pericolic gutters and pelvis. Musculoskeletal: No acute or significant osseous findings. IMPRESSION: Minimal bilateral pleural effusions with adjacent subsegmental atelectasis. Minimal  cholelithiasis is noted. Gallbladder wall thickening cannot be excluded and further evaluation with ultrasound is recommended evaluate for possible cholecystitis. Minimal ascites is noted. Nonobstructive right renal calculus. No hydronephrosis or renal obstruction is noted. Small left inguinal hernia. Aortic Atherosclerosis (ICD10-I70.0). Electronically Signed   By: Marijo Conception, M.D.   On: 12/07/2017 19:38   Ct Head Wo Contrast  Result Date: 11/16/2017 CLINICAL DATA:  Worsening confusion, increasing thirst for a few days. Recurrent falls. History of atrial fibrillation, hypertension, hypercholesterolemia. EXAM: CT HEAD WITHOUT CONTRAST TECHNIQUE: Contiguous axial images were obtained from the base of the skull through the vertex without intravenous contrast. COMPARISON:  None. FINDINGS: BRAIN: No intraparenchymal hemorrhage, mass effect nor midline shift. The ventricles and sulci are normal for age. Patchy supratentorial white matter hypodensities less than expected for patient's age, though non-specific are most compatible with chronic small vessel ischemic disease. 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 Old bilateral cerebellar infarcts. Beam hardening artifact through bilateral occipital lobes. No acute large vascular territory infarct. Punctate parenchymal calcifications. No abnormal extra-axial fluid collections. Basal cisterns are patent. VASCULAR: Moderate calcific atherosclerosis of the carotid siphons. SKULL: No skull fracture. No significant scalp soft tissue swelling. No sellar expansion. SINUSES/ORBITS: Mild paranasal sinus mucosal thickening with air-fluid levels. Mastoid air cells are well aerated.The included ocular globes and orbital contents are non-suspicious. Status post bilateral ocular lens implants. OTHER: None. IMPRESSION: 1. No acute intracranial process. 2. 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 Old bilateral cerebellar infarcts. 3. Beam hardening artifact  occipital lobes, unlikely to reflect stroke or PRES. Electronically Signed   By: Elon Alas M.D.   On: 12/12/2017 17:57   Nm Hepatobiliary Liver Func  Result Date: 12/08/2017 CLINICAL DATA:  Abnormal liver function tests. Elevated bilirubin, AST and ALT. Gallstones and gallbladder sludge on ultrasound dated 12/07/2017. EXAM: NUCLEAR MEDICINE HEPATOBILIARY IMAGING TECHNIQUE: Sequential images of the abdomen were obtained out to 60 minutes following intravenous administration of radiopharmaceutical. RADIOPHARMACEUTICALS:  7.91 mCi Tc-24m  Choletec IV COMPARISON:  CT and ultrasound, 12/07/2017. FINDINGS: There is prompt and homogeneous uptake of the radiotracer by the liver. No defects. Despite imaging for 2 hours, no radiotracer activity was seen in the common bile duct, gallbladder or small bowel. There is no decrease in liver activity over the duration of the exam. IMPRESSION: 1. Nonvisualization of the gallbladder and small bowel with no evidence  of radiotracer within the common bile duct. Persistent activity noted throughout the liver. Findings support hepatocellular dysfunction, with depressed hepatic excretion of bile. 2. Acute cholecystitis is not excluded. Electronically Signed   By: Lajean Manes M.D.   On: 12/08/2017 12:33   US Renal  Result Date: 11/24/2017 CLINICAL DATA:  Acute kidney injury.  History of hypertension. EXAM: RENAL / URINARY TRACT ULTRASOUND COMPLETE COMPARISON:  None. FINDINGS: Right Kidney: Length: 11 cm. Echogenicity within normal limits. No mass or hydronephrosis visualized. Mild cortical thinning. Left Kidney: Length: 11.7 cm. Echogenicity within normal limits. No mass or hydronephrosis visualized. Mild cortical thinning. Bladder: Appears normal for degree of bladder distention. Small volume anechoic perihepatic free fluid. IMPRESSION: 1. Mild bilateral cortical atrophy.  No obstructive uropathy. 2. Small volume ascites. Electronically Signed   By: Elon Alas M.D.    On: 12/01/2017 20:52   Dg Chest Port 1 View  Result Date: 12/07/2017 CLINICAL DATA:  Central line placement. EXAM: PORTABLE CHEST 1 VIEW COMPARISON:  12/08/2017 at 1356 hours FINDINGS: New right central venous line has been inserted through the right internal jugular vein. Tip projects over the upper right heart, likely within the right atrium. No pneumothorax.  No other change from the prior study. IMPRESSION: Right internal jugular central venous line tip projects within the right atrium. Caval atrial junction likely resides 2-3 cm above this. No pneumothorax. Electronically Signed   By: Lajean Manes M.D.   On: 12/07/2017 17:29   Dg Chest Port 1 View  Result Date: 11/24/2017 CLINICAL DATA:  Shortness of breath and weakness. Fusion. Lethargic. EXAM: PORTABLE CHEST 1 VIEW COMPARISON:  07/26/2017 FINDINGS: Chronic cardiomegaly. Pulmonary vascularity is normal in the lungs are clear except for minimal atelectasis at the lung bases laterally. No consolidative infiltrates or effusions. No acute bone abnormality. Aortic atherosclerosis. IMPRESSION: Chronic cardiomegaly. Minimal atelectasis at the lung bases laterally. Aortic Atherosclerosis (ICD10-I70.0). Electronically Signed   By: Lorriane Shire M.D.   On: 12/12/2017 15:59   Dg Chest Port 1v Same Day  Result Date: 12/07/2017 CLINICAL DATA:  Hypoxia. EXAM: PORTABLE CHEST 1 VIEW COMPARISON:  12/04/2017 FINDINGS: The cardiac silhouette remains prominently enlarged. Aortic atherosclerosis is noted. The lungs remain mildly hypoinflated with minimal atelectasis in both lung bases, similar to the prior study. No edema, sizable pleural effusion, or pneumothorax is identified. IMPRESSION: Unchanged cardiomegaly and minimal bibasilar atelectasis. Electronically Signed   By: Logan Bores M.D.   On: 12/07/2017 14:25   US Abdomen Limited Ruq  Result Date: 12/07/2017 CLINICAL DATA:  Elevated liver functions with elevated bilirubin, alkaline phosphatase, and lactic  acid. History of hypertension. EXAM: ULTRASOUND ABDOMEN LIMITED RIGHT UPPER QUADRANT COMPARISON:  CT abdomen and pelvis 12/07/2017 FINDINGS: Gallbladder: Cholelithiasis with small stones measuring up to 6 mm diameter. Layering sludge in the gallbladder. There is diffuse gallbladder wall thickness and pericholecystic edema. Murphy's sign is negative. Common bile duct: Diameter: 4.8 mm, normal Liver: No focal lesion identified. Within normal limits in parenchymal echogenicity. Portal vein is patent on color Doppler imaging. During real-time imaging, there is evidence of reversal of flow direction with to and fro flow identified. Small amount of free fluid in the upper abdomen, likely ascites. IMPRESSION: 1. Cholelithiasis with gallbladder sludge, gallbladder wall thickening, and pericholecystic edema. Negative Murphy's sign. Changes are nonspecific and could be due to cholecystitis, liver disease, ascites, or heart failure. 2. Intermittent reversal of flow demonstrated in the portal veins. This may be due to liver disease or cirrhosis. 3. Mild ascites.  Electronically Signed   By: Lucienne Capers M.D.   On: 12/07/2017 23:23    Labs:  CBC: Recent Labs    12/07/17 0928 12/07/17 1733 12/08/17 0558 12/08/17 1500 12/09/17 0407  WBC 13.1* 12.3* 11.7*  --  6.5  HGB 11.2* 10.0* 9.3*  --  8.9*  HCT 40.0 35.0* 31.8*  --  28.8*  PLT 150 166 125* 111* 93*    COAGS: Recent Labs    12/08/17 0920 12/08/17 1340 12/08/17 1500 12/08/17 1700 12/09/17 0407  INR 7.71* 7.67* 7.37* 5.79* 4.38*  APTT 81*  --  86*  --  52*    BMP: Recent Labs    12/05/2017 1131 12/07/17 1733 12/08/17 1340 12/09/17 0407  NA 134* 134* 139 143  K 4.3 4.3 3.4* 2.9*  CL 97* 99* 101 100*  CO2 16* 12* 26 30  GLUCOSE 76 101* 105* 94  BUN 37* 53* 60* 53*  CALCIUM 9.4 9.2 8.6* 8.4*  CREATININE 2.44* 3.21* 3.15* 2.39*  GFRNONAA 24* 17* 17* 24*  GFRAA 28* 20* 20* 28*    LIVER FUNCTION TESTS: Recent Labs    12/07/17 1733  12/08/17 1340 12/08/17 1700 12/09/17 0407  BILITOT 5.9* 7.1* 7.1* 7.4*  AST 456* 1,269* 1,163* 882*  ALT 270* 740* 739* 658*  ALKPHOS 57 58 55 57  PROT 6.3* 6.1* 6.1* 6.3*  ALBUMIN 2.9* 2.6* 2.7* 2.8*    TUMOR MARKERS: No results for input(s): AFPTM, CEA, CA199, CHROMGRNA in the last 8760 hours.  Assessment and Plan:  RUQ pain Elevated LFTs; elevated TB Cholecystitis per imaging Elevated INR Scheduled for percutaneous cholecystostomy drain placement-- when INR down Risks and benefits discussed with the patient's son including, but not limited to bleeding, infection, gallbladder perforation, bile leak, sepsis or even death.  All of his questions were answered, he is agreeable to proceed. Consent signed and in chart.   Thank you for this interesting consult.  I greatly enjoyed meeting WOODWARD KLEM and look forward to participating in their care.  A copy of this report was sent to the requesting provider on this date.  Electronically Signed: Lavonia Drafts, PA-C 12/09/2017, 10:53 AM   I spent a total of 40 Minutes    in face to face in clinical consultation, greater than 50% of which was counseling/coordinating care for perc chole drain

## 2017-12-09 NOTE — Progress Notes (Signed)
PT Cancellation Note  Patient Details Name: Jacob Shannon MRN: [id number] DOB: [birth year]   Cancelled Treatment:    Reason Eval/Treat Not Completed: Medical issues which prohibited therapy; patient just back from IR for cholecystostomy drain.  RN reports not awake or alert to follow commands at this time.   Will check back another day.   Reginia Naas 12/09/2017, 3:39 PM Magda Kiel, [place] 12/09/2017

## 2017-12-09 NOTE — Progress Notes (Signed)
eLink Physician-Brief Progress Note Patient Name: Jacob Shannon DOB: 1940-10-30 MRN: [id number]   Date of Service  12/09/2017  HPI/Events of Note  Lactic Acid = 2.5 --> 2.1.  eICU Interventions  Continue to trend lactic acid.      Intervention Category Major Interventions: Acid-Base disturbance - evaluation and management  Roque Schill [place] 12/09/2017, 5:28 AM

## 2017-12-09 NOTE — Progress Notes (Signed)
PULMONARY / CRITICAL CARE MEDICINE   Name: [name] MRN: [id number] DOB: 22-Oct-1940    ADMISSION DATE:  11/20/2017 CONSULTATION DATE:  12/07/2017  REFERRING MD:  [facility] Dr. Myna Hidalgo  CHIEF COMPLAINT:  Hypoxic/lethargic   Brief HPI:   Jacob Shannon is a 77 y.o. male with new onset altered mental status who was admitted with A-fib RVR and required diltiazem infusion to control. Over the last 24-48hrs has become more lethargic and also complaining of gastric discomfort and pain. Laboratory assessment has been an issue as he has vasculitis and poor access. Additionally, the patient has a worsening renal function with metabolic acidosis on recent ABG.   SUBJECTIVE:  No acute change.  Confused.  INR trending down but very slowly.  Got FFP this am  VITAL SIGNS: BP (!) 143/80   Pulse (!) 104   Temp 97.8 F (36.6 C) (Axillary)   Resp 15   Ht 5\' 8"  (1.727 m)   Wt 89 kg (196 lb 3.4 oz)   SpO2 98%   BMI 29.83 kg/m   HEMODYNAMICS: CVP:  [1 mmHg-24 mmHg] 19 mmHg  INTAKE / OUTPUT: I/O last 3 completed shifts: In: 4538.1 [I.V.:1750.7; Blood:1536; IV Piggyback:1251.4] Out: 3630 [Urine:3630]  PHYSICAL EXAMINATION: General:  Chronically ill appearing elderly male, NAD in bed  HEENT: MM pink/moist, icteric sclera  Neuro: confused, MAE, follows commands intermittently  CV: s1s2 rrr, no m/r/g PULM: even/non-labored, lungs bilaterally few scattered rhonchi  IH:KVQQ, non-tender, bsx4 active  Extremities: warm/dry, scant BLE edema  Skin: no rashes or lesions  LABS:  BMET Recent Labs  Lab 12/07/17 1733 12/08/17 1340 12/09/17 0407  NA 134* 139 143  K 4.3 3.4* 2.9*  CL 99* 101 100*  CO2 12* 26 30  BUN 53* 60* 53*  CREATININE 3.21* 3.15* 2.39*  GLUCOSE 101* 105* 94    Electrolytes Recent Labs  Lab 12/07/17 1733 12/08/17 1340 12/09/17 0407  CALCIUM 9.2 8.6* 8.4*  MG 2.5*  --   --   PHOS 8.4*  --   --     CBC Recent Labs  Lab 12/07/17 1733 12/08/17 0558 12/08/17 1500  12/09/17 0407  WBC 12.3* 11.7*  --  6.5  HGB 10.0* 9.3*  --  8.9*  HCT 35.0* 31.8*  --  28.8*  PLT 166 125* 111* 93*    Coag's Recent Labs  Lab 12/08/17 0920  12/08/17 1500 12/08/17 1700 12/09/17 0407  APTT 81*  --  86*  --  52*  INR 7.71*   < > 7.37* 5.79* 4.38*   < > = values in this interval not displayed.    Sepsis Markers Recent Labs  Lab 12/07/17 1733  12/08/17 0000 12/08/17 0558 12/08/17 1728 12/09/17 0407  LATICACIDVEN 11.7*   < > 9.9*  --  2.5* 2.1*  PROCALCITON 2.63  --   --  3.11  --  2.11   < > = values in this interval not displayed.    ABG Recent Labs  Lab 12/07/17 2235 12/08/17 0505 12/08/17 1737  PHART 7.291* 7.374 7.488*  PCO2ART 22.8* 29.8* 35.1  PO2ART 148* 117* 103.0    Liver Enzymes Recent Labs  Lab 12/08/17 1340 12/08/17 1700 12/09/17 0407  AST 1,269* 1,163* 882*  ALT 740* 739* 658*  ALKPHOS 58 55 57  BILITOT 7.1* 7.1* 7.4*  ALBUMIN 2.6* 2.7* 2.8*    Cardiac Enzymes Recent Labs  Lab 12/04/2017 2150 12/07/17 1733 12/08/17 1700  TROPONINI 0.05* 0.18* 0.12*    Glucose Recent  Labs  Lab 12/08/17 1303 12/08/17 1627 12/08/17 1941 12/08/17 2328 12/09/17 0339 12/09/17 0821  GLUCAP 75 90 91 85 87 82    Imaging Nm Hepatobiliary Liver Func  Result Date: 12/08/2017 CLINICAL DATA:  Abnormal liver function tests. Elevated bilirubin, AST and ALT. Gallstones and gallbladder sludge on ultrasound dated 12/07/2017. EXAM: NUCLEAR MEDICINE HEPATOBILIARY IMAGING TECHNIQUE: Sequential images of the abdomen were obtained out to 60 minutes following intravenous administration of radiopharmaceutical. RADIOPHARMACEUTICALS:  7.91 mCi Tc-18m  Choletec IV COMPARISON:  CT and ultrasound, 12/07/2017. FINDINGS: There is prompt and homogeneous uptake of the radiotracer by the liver. No defects. Despite imaging for 2 hours, no radiotracer activity was seen in the common bile duct, gallbladder or small bowel. There is no decrease in liver activity over  the duration of the exam. IMPRESSION: 1. Nonvisualization of the gallbladder and small bowel with no evidence of radiotracer within the common bile duct. Persistent activity noted throughout the liver. Findings support hepatocellular dysfunction, with depressed hepatic excretion of bile. 2. Acute cholecystitis is not excluded. Electronically Signed   By: Lajean Manes M.D.   On: 12/08/2017 12:33     STUDIES:  CTH 6/21>>NAICA, old infarct CT A/P 6/22>>Mild ascites with cholelithiasis and possible sludge  HIDA 6/23>>1. Nonvisualization of the gallbladder and small bowel with no evidence of radiotracer within the common bile duct. Persistent activity noted throughout the liver. Findings support hepatocellular dysfunction, with depressed hepatic excretion of bile. 2. Acute cholecystitis is not excluded.   CULTURES: BCx 6/22>>NGTD  ANTIBIOTICS: Vancomycin 6/21>>6/22 Zosyn 6/21>>>  SIGNIFICANT EVENTS: -INR elevated >10 and treated with Vit K/Praxbind. Repeat 3>>>7 -LFTs continue to worsen  -IR consulted and amendable to doing percutaneous drain once coagulopathy fixed.   LINES/TUBES: CVC RIJ 6/22  DISCUSSION: Jacob Shannon is a 77 y.o. male with acute hepatic failure with unclear etiology and associated AKI who initially presented with A-fib with RVR. Significantly coagulopathic that was reversed with Vit K and Praxbind. Continues to have worsening LFTs and underwent CT of A/P that revealed possible cholecystitis that was then confirmed with HIDA scan. IR on board and may take down for percutaneous drainage once INR improves. Receiving FFP and reaching out to Hepatology for further assistance in workup/managment. Hemodynamically remains stable with MAP 80-90 and only on minimal supplemental oxygen.   ASSESSMENT / PLAN:  PULMONARY A: Minimally hypoxic  P:   Intermittent f/u CXR  Supplemental O2 as needed   CARDIOVASCULAR A:  Afib with RVR, resolved  dCHF with elevated CVP  throughout the day  SIRS  P:  Off cardizem  Continue metoprolol  Lasix given 6/23 F/u chem    RENAL A:   AKI with metabolic acidosis - improving slowly  Hypokalemia  P:   F/u chem  Replace K   GASTROINTESTINAL A:   Acute liver failure, unclear etiology  Cholecystitis  P:   IR following - plan for perc drain once INR improved  GI to see  F/u LFT's - trending down    HEMATOLOGIC A:   Coagulopathy  P:  FFP  S/p Vit K  Trend INR    INFECTIOUS A:   Cholecystitis  P:   Zosyn as above  Perc drain per IR once INR improved  Hepatitis panel pending  Trend lactate, pct  Follow cultures   ENDOCRINE A:   Hypoglycemia, resolved    P:   Monitor   NEUROLOGIC A:   AMS P:   Check ammonia  Supportive care  FAMILY  - Updates: Family updated at bedside 6/24 - Inter-disciplinary family meet or Palliative Care meeting due by:  6/25  Nickolas Madrid, NP 12/09/2017  11:13 AM Pager: [phone] or [phone]

## 2017-12-09 NOTE — Procedures (Signed)
Interventional Radiology Procedure Note  Procedure: Perc chole, with 80F drain. Sample sent EBL: 0 .  Complications: None  Recommendations:  - follow up culture - Frequent dressing changes - Do not submerge  - Routine care   Signed,  Dulcy Fanny. Earleen Newport, DO

## 2017-12-09 NOTE — Progress Notes (Signed)
eLink Physician-Brief Progress Note Patient Name: KEYLER HOGE DOB: 05/15/1941 MRN: [id number]   Date of Service  12/09/2017  HPI/Events of Note  INR = 4.38.  eICU Interventions  Will transfuse 2 units FFP now.      Intervention Category Intermediate Interventions: Coagulopathy - evaluation and management  Geniyah Eischeid Eugene 12/09/2017, 5:36 AM

## 2017-12-09 NOTE — Progress Notes (Signed)
[facility] Progress Note Patient Name: Jacob Shannon DOB: [birth year] MRN: [id number]   Date of Service  12/09/2017  HPI/Events of Note  K+ = 2.9 and Creatinine = 2.39. Patient is on Lasix 40 mg IV BID.  eICU Interventions  Will replace K+.     Intervention Category Major Interventions: Electrolyte abnormality - evaluation and management  Sommer,Steven Eugene 12/09/2017, 5:57 AM

## 2017-12-10 ENCOUNTER — Inpatient Hospital Stay (HOSPITAL_COMMUNITY): Payer: Medicare Other

## 2017-12-10 DIAGNOSIS — R4182 Altered mental status, unspecified: Secondary | ICD-10-CM

## 2017-12-10 LAB — POCT I-STAT 3, ART BLOOD GAS (G3+)
ACID-BASE EXCESS: 11 mmol/L — AB (ref 0.0–2.0)
ACID-BASE EXCESS: 16 mmol/L — AB (ref 0.0–2.0)
BICARBONATE: 39 mmol/L — AB (ref 20.0–28.0)
Bicarbonate: 35.5 mmol/L — ABNORMAL HIGH (ref 20.0–28.0)
O2 SAT: 100 %
O2 Saturation: 97 %
PCO2 ART: 44.1 mmHg (ref 32.0–48.0)
PO2 ART: 85 mmHg (ref 83.0–108.0)
TCO2: 37 mmol/L — ABNORMAL HIGH (ref 22–32)
TCO2: 40 mmol/L — AB (ref 22–32)
pCO2 arterial: 40.9 mmHg (ref 32.0–48.0)
pH, Arterial: 7.513 — ABNORMAL HIGH (ref 7.350–7.450)
pH, Arterial: 7.587 — ABNORMAL HIGH (ref 7.350–7.450)
pO2, Arterial: 443 mmHg — ABNORMAL HIGH (ref 83.0–108.0)

## 2017-12-10 LAB — HEPATIC FUNCTION PANEL
ALK PHOS: 49 U/L (ref 38–126)
ALK PHOS: 49 U/L (ref 38–126)
ALT: 551 U/L — AB (ref 0–44)
ALT: 518 U/L — AB (ref 0–44)
AST: 616 U/L — AB (ref 15–41)
AST: 531 U/L — ABNORMAL HIGH (ref 15–41)
Albumin: 2.8 g/dL — ABNORMAL LOW (ref 3.5–5.0)
Albumin: 2.6 g/dL — ABNORMAL LOW (ref 3.5–5.0)
BILIRUBIN DIRECT: 5.5 mg/dL — AB (ref 0.0–0.2)
BILIRUBIN INDIRECT: 5 mg/dL — AB (ref 0.3–0.9)
BILIRUBIN TOTAL: 11.3 mg/dL — AB (ref 0.3–1.2)
Bilirubin, Direct: 4.5 mg/dL — ABNORMAL HIGH (ref 0.0–0.2)
Indirect Bilirubin: 5.8 mg/dL — ABNORMAL HIGH (ref 0.3–0.9)
TOTAL PROTEIN: 6.3 g/dL — AB (ref 6.5–8.1)
Total Bilirubin: 9.5 mg/dL — ABNORMAL HIGH (ref 0.3–1.2)
Total Protein: 7.1 g/dL (ref 6.5–8.1)

## 2017-12-10 LAB — GLUCOSE, CAPILLARY
GLUCOSE-CAPILLARY: 102 mg/dL — AB (ref 70–99)
Glucose-Capillary: 89 mg/dL (ref 70–99)
Glucose-Capillary: 94 mg/dL (ref 70–99)
Glucose-Capillary: 97 mg/dL (ref 70–99)
Glucose-Capillary: 87 mg/dL (ref 70–99)
Glucose-Capillary: 88 mg/dL (ref 70–99)

## 2017-12-10 LAB — TRIGLYCERIDES: Triglycerides: 97 mg/dL (ref <150)

## 2017-12-10 LAB — PREPARE FRESH FROZEN PLASMA
Unit division: 0
Unit division: 0

## 2017-12-10 LAB — BASIC METABOLIC PANEL
ANION GAP: 13 (ref 5–15)
BUN: 41 mg/dL — AB (ref 8–23)
CHLORIDE: 102 mmol/L (ref 98–111)
CO2: 35 mmol/L — ABNORMAL HIGH (ref 22–32)
Calcium: 8.7 mg/dL — ABNORMAL LOW (ref 8.9–10.3)
Creatinine, Ser: 1.9 mg/dL — ABNORMAL HIGH (ref 0.61–1.24)
GFR calc Af Amer: 37 mL/min — ABNORMAL LOW (ref >60)
GFR calc non Af Amer: 32 mL/min — ABNORMAL LOW (ref >60)
Glucose, Bld: 100 mg/dL — ABNORMAL HIGH (ref 70–99)
POTASSIUM: 3 mmol/L — AB (ref 3.5–5.1)
SODIUM: 150 mmol/L — AB (ref 135–145)

## 2017-12-10 LAB — AMMONIA: Ammonia: 45 umol/L — ABNORMAL HIGH (ref 9–35)

## 2017-12-10 LAB — BPAM FFP
Blood Product Expiration Date: 201906282359
Blood Product Expiration Date: 201906282359
ISSUE DATE / TIME: 201906240548
ISSUE DATE / TIME: 201906240548
Unit Type and Rh: 6200
Unit Type and Rh: 6200

## 2017-12-10 MED ORDER — FUROSEMIDE 10 MG/ML IJ SOLN
40.0000 mg | Freq: Once | INTRAMUSCULAR | Status: AC
  Administered 2017-12-10: 40 mg via INTRAVENOUS
  Filled 2017-12-10: qty 4

## 2017-12-10 MED ORDER — POTASSIUM CHLORIDE 10 MEQ/50ML IV SOLN
10.0000 meq | INTRAVENOUS | Status: AC
  Administered 2017-12-10 (×6): 10 meq via INTRAVENOUS
  Filled 2017-12-10 (×6): qty 50

## 2017-12-10 MED ORDER — CHLORHEXIDINE GLUCONATE 0.12% ORAL RINSE (MEDLINE KIT)
15.0000 mL | Freq: Two times a day (BID) | OROMUCOSAL | Status: DC
  Administered 2017-12-10 – 2017-12-15 (×10): 15 mL via OROMUCOSAL

## 2017-12-10 MED ORDER — PROPOFOL 1000 MG/100ML IV EMUL
INTRAVENOUS | Status: AC
  Filled 2017-12-10: qty 100

## 2017-12-10 MED ORDER — MIDAZOLAM HCL 2 MG/2ML IJ SOLN
INTRAMUSCULAR | Status: AC
  Filled 2017-12-10: qty 6

## 2017-12-10 MED ORDER — ETOMIDATE 2 MG/ML IV SOLN
0.3000 mg/kg | Freq: Once | INTRAVENOUS | Status: AC
  Administered 2017-12-10: 30 mg via INTRAVENOUS

## 2017-12-10 MED ORDER — ORAL CARE MOUTH RINSE
15.0000 mL | OROMUCOSAL | Status: DC
  Administered 2017-12-10 – 2017-12-15 (×46): 15 mL via OROMUCOSAL

## 2017-12-10 MED ORDER — LACTATED RINGERS IV SOLN
INTRAVENOUS | Status: DC
  Administered 2017-12-10 – 2017-12-12 (×2): via INTRAVENOUS

## 2017-12-10 MED ORDER — PROPOFOL 1000 MG/100ML IV EMUL
5.0000 ug/kg/min | INTRAVENOUS | Status: DC
  Administered 2017-12-10: 15 ug/kg/min via INTRAVENOUS
  Administered 2017-12-11: 10 ug/kg/min via INTRAVENOUS
  Administered 2017-12-12: 5 ug/kg/min via INTRAVENOUS
  Filled 2017-12-10 (×3): qty 100

## 2017-12-10 NOTE — Progress Notes (Signed)
Referring Physician(s): CCM  Supervising Physician: Sandi Mariscal  Patient Status:  Jacob Shannon - In-pt  Chief Complaint:  Cholecystitis AMS  Subjective:  Percutaneous Chole drain placed in IR 6/24 Pt still with no response to me Seems in no distress  Allergies: Tramadol  Medications: Prior to Admission medications   Medication Sig Start Date End Date Taking? Authorizing Provider  colchicine 0.6 MG tablet Take 0.6 mg by mouth 2 (two) times daily.    Yes [provider]  dabigatran (PRADAXA) 150 MG CAPS capsule Take 150 mg by mouth 2 (two) times daily.   Yes [provider]  docusate sodium (COLACE) 100 MG capsule Take 100 mg by mouth daily.   Yes [provider]  DULoxetine (CYMBALTA) 30 MG capsule Take 30 mg by mouth daily.   Yes [provider]  metoprolol tartrate (LOPRESSOR) 25 MG tablet Take 12.5-25 mg by mouth See admin instructions. Take 12.5 mg by mouth in the morning and 25 mg in the evening   Yes [provider]  potassium chloride SA (K-DUR,KLOR-CON) 20 MEQ tablet Take 20 mEq by mouth 2 (two) times daily.   Yes [provider]  torsemide (DEMADEX) 20 MG tablet Take 40-60 mg by mouth See admin instructions. Take 60 mg by mouth in the morning and 40 mg midday for fluid/edema 11/19/16  Yes Martinique, Peter M, MD  metoprolol succinate (TOPROL-XL) 25 MG 24 hr tablet Take 1 tablet (25 mg total) by mouth 3 (three) times daily. PLEASE CONTACT OFFICE FOR ADDITIONAL REFILLS Patient not taking: Reported on 12/01/2017 09/11/16   Martinique, Peter M, MD     Vital Signs: BP 111/86   Pulse (!) 106   Temp 98.4 F (36.9 C) (Axillary)   Resp (!) 24   Ht 5\' 8"  (1.727 m)   Wt 196 lb 13.9 oz (89.3 kg)   SpO2 95%   BMI 29.93 kg/m   Physical Exam  Abdominal: Soft. Bowel sounds are normal.  Skin: Skin is warm and dry.  Site is clean and dry OP chole: bloody bile OP 55 cc recorded-- 10 cc in bag  TB increasing LFTs decreasing  Nursing  note and vitals reviewed.   Imaging: Ct Abdomen Pelvis Wo Contrast  Result Date: 12/07/2017 CLINICAL DATA:  Nausea, vomiting. EXAM: CT ABDOMEN AND PELVIS WITHOUT CONTRAST TECHNIQUE: Multidetector CT imaging of the abdomen and pelvis was performed following the standard protocol without IV contrast. COMPARISON:  CT scan of May 09, 2015. FINDINGS: Lower chest: Minimal bilateral pleural effusions are noted with adjacent subsegmental atelectasis. Hepatobiliary: Minimal cholelithiasis is noted. Liver is unremarkable on these unenhanced images. No biliary dilatation is noted. There may be gallbladder wall thickening. Pancreas: Unremarkable. No pancreatic ductal dilatation or surrounding inflammatory changes. Spleen: Mild amount of free fluid is noted around the spleen. Adrenals/Urinary Tract: Adrenal glands appear normal. Nonobstructive right renal calculus is noted. No hydronephrosis or renal obstruction is noted. Urinary bladder is unremarkable. Stomach/Bowel: The stomach is unremarkable. Status post appendectomy. There is no evidence of bowel obstruction. Vascular/Lymphatic: Aortic atherosclerosis. No enlarged abdominal or pelvic lymph nodes. Reproductive: Prostate is unremarkable. Other: Small left inguinal hernia is noted. Minimal amount of free fluid is noted in the pericolic gutters and pelvis. Musculoskeletal: No acute or significant osseous findings. IMPRESSION: Minimal bilateral pleural effusions with adjacent subsegmental atelectasis. Minimal cholelithiasis is noted. Gallbladder wall thickening cannot be excluded and further evaluation with ultrasound is recommended evaluate for possible cholecystitis. Minimal ascites is noted. Nonobstructive right renal calculus.  No hydronephrosis or renal obstruction is noted. Small left inguinal hernia. Aortic Atherosclerosis (ICD10-I70.0). Electronically Signed   By: Marijo Conception, M.D.   On: 12/07/2017 19:38   Ct Head Wo Contrast  Result Date:  12/08/2017 CLINICAL DATA:  Worsening confusion, increasing thirst for a few days. Recurrent falls. History of atrial fibrillation, hypertension, hypercholesterolemia. EXAM: CT HEAD WITHOUT CONTRAST TECHNIQUE: Contiguous axial images were obtained from the base of the skull through the vertex without intravenous contrast. COMPARISON:  None. FINDINGS: BRAIN: No intraparenchymal hemorrhage, mass effect nor midline shift. The ventricles and sulci are normal for age. Patchy supratentorial white matter hypodensities less than expected for patient's age, though non-specific are most compatible with chronic small vessel ischemic disease. 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 Old bilateral cerebellar infarcts. Beam hardening artifact through bilateral occipital lobes. No acute large vascular territory infarct. Punctate parenchymal calcifications. No abnormal extra-axial fluid collections. Basal cisterns are patent. VASCULAR: Moderate calcific atherosclerosis of the carotid siphons. SKULL: No skull fracture. No significant scalp soft tissue swelling. No sellar expansion. SINUSES/ORBITS: Mild paranasal sinus mucosal thickening with air-fluid levels. Mastoid air cells are well aerated.The included ocular globes and orbital contents are non-suspicious. Status post bilateral ocular lens implants. OTHER: None. IMPRESSION: 1. No acute intracranial process. 2. 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 Old bilateral cerebellar infarcts. 3. Beam hardening artifact occipital lobes, unlikely to reflect stroke or PRES. Electronically Signed   By: Elon Alas M.D.   On: 12/14/2017 17:57   Nm Hepatobiliary Liver Func  Result Date: 12/08/2017 CLINICAL DATA:  Abnormal liver function tests. Elevated bilirubin, AST and ALT. Gallstones and gallbladder sludge on ultrasound dated 12/07/2017. EXAM: NUCLEAR MEDICINE HEPATOBILIARY IMAGING TECHNIQUE: Sequential images of the abdomen were obtained out to 60 minutes  following intravenous administration of radiopharmaceutical. RADIOPHARMACEUTICALS:  7.91 mCi Tc-42m  Choletec IV COMPARISON:  CT and ultrasound, 12/07/2017. FINDINGS: There is prompt and homogeneous uptake of the radiotracer by the liver. No defects. Despite imaging for 2 hours, no radiotracer activity was seen in the common bile duct, gallbladder or small bowel. There is no decrease in liver activity over the duration of the exam. IMPRESSION: 1. Nonvisualization of the gallbladder and small bowel with no evidence of radiotracer within the common bile duct. Persistent activity noted throughout the liver. Findings support hepatocellular dysfunction, with depressed hepatic excretion of bile. 2. Acute cholecystitis is not excluded. Electronically Signed   By: Lajean Manes M.D.   On: 12/08/2017 12:33   US Renal  Result Date: 11/30/2017 CLINICAL DATA:  Acute kidney injury.  History of hypertension. EXAM: RENAL / URINARY TRACT ULTRASOUND COMPLETE COMPARISON:  None. FINDINGS: Right Kidney: Length: 11 cm. Echogenicity within normal limits. No mass or hydronephrosis visualized. Mild cortical thinning. Left Kidney: Length: 11.7 cm. Echogenicity within normal limits. No mass or hydronephrosis visualized. Mild cortical thinning. Bladder: Appears normal for degree of bladder distention. Small volume anechoic perihepatic free fluid. IMPRESSION: 1. Mild bilateral cortical atrophy.  No obstructive uropathy. 2. Small volume ascites. Electronically Signed   By: Elon Alas M.D.   On: 11/18/2017 20:52   Ir Perc Cholecystostomy  Result Date: 12/09/2017 INDICATION: 77 year old male with liver function, multiorgan dysfunction, and possible cholecystitis. The patient has had elevated INR, and referred for percutaneous cholecystostomy. Given the patient's ICU status and possibility of acute cholecystitis contributing, drainage of gallbladder is reasonable. EXAM: CHOLECYSTOSTOMY MEDICATIONS: In house antibiotics  ANESTHESIA/SEDATION: Moderate (conscious) sedation was employed during this procedure. A total of Versed 0.5 mg and Fentanyl  50 mcg was administered intravenously. Moderate Sedation Time: 13 minutes. The patient's level of consciousness and vital signs were monitored continuously by radiology nursing throughout the procedure under my direct supervision. FLUOROSCOPY TIME:  Fluoroscopy Time: 0 minutes 36 seconds (4 mGy). COMPLICATIONS: None PROCEDURE: Informed written consent was obtained from the patient and the patient's family after a thorough discussion of the procedural risks, benefits and alternatives. All questions were addressed. Maximal Sterile Barrier Technique was utilized including caps, mask, sterile gowns, sterile gloves, sterile drape, hand hygiene and skin antiseptic. A timeout was performed prior to the initiation of the procedure. Ultrasound survey of the right upper quadrant was performed for planning purposes. Once the patient is prepped and draped in the usual sterile fashion, the skin and subcutaneous tissues overlying the gallbladder were generously infiltrated 1% lidocaine for local anesthesia. A coaxial needle was advanced under ultrasound guidance through the skin subcutaneous tissues and a small segment of liver into the gallbladder lumen. With removal of the stylet, spontaneous dark bile drainage occurred. Using modified Seldinger technique, a 10 French drain was placed into the gallbladder fossa, with aspiration of the sample for the lab. Contrast injection confirmed position of the tube within the gallbladder lumen. Drainage catheter was attached to gravity drain with a suture retention placed. Patient tolerated the procedure well and remained hemodynamically stable throughout. No complications were encountered and no significant blood loss encountered. IMPRESSION: Status post percutaneous cholecystostomy. Signed, Dulcy Fanny. Dellia Nims, RPVI [facility]  [facility] Electronically Signed   By: Corrie Mckusick D.O.   On: 12/09/2017 14:01   Dg Chest Port 1 View  Result Date: 12/07/2017 CLINICAL DATA:  Central line placement. EXAM: PORTABLE CHEST 1 VIEW COMPARISON:  12/08/2017 at 1356 hours FINDINGS: New right central venous line has been inserted through the right internal jugular vein. Tip projects over the upper right heart, likely within the right atrium. No pneumothorax.  No other change from the prior study. IMPRESSION: Right internal jugular central venous line tip projects within the right atrium. Caval atrial junction likely resides 2-3 cm above this. No pneumothorax. Electronically Signed   By: Lajean Manes M.D.   On: 12/07/2017 17:29   Dg Chest Port 1 View  Result Date: 12/07/2017 CLINICAL DATA:  Shortness of breath and weakness. Fusion. Lethargic. EXAM: PORTABLE CHEST 1 VIEW COMPARISON:  07/26/2017 FINDINGS: Chronic cardiomegaly. Pulmonary vascularity is normal in the lungs are clear except for minimal atelectasis at the lung bases laterally. No consolidative infiltrates or effusions. No acute bone abnormality. Aortic atherosclerosis. IMPRESSION: Chronic cardiomegaly. Minimal atelectasis at the lung bases laterally. Aortic Atherosclerosis (ICD10-I70.0). Electronically Signed   By: Lorriane Shire M.D.   On: 11/27/2017 15:59   Dg Chest Port 1v Same Day  Result Date: 12/07/2017 CLINICAL DATA:  Hypoxia. EXAM: PORTABLE CHEST 1 VIEW COMPARISON:  11/25/2017 FINDINGS: The cardiac silhouette remains prominently enlarged. Aortic atherosclerosis is noted. The lungs remain mildly hypoinflated with minimal atelectasis in both lung bases, similar to the prior study. No edema, sizable pleural effusion, or pneumothorax is identified. IMPRESSION: Unchanged cardiomegaly and minimal bibasilar atelectasis. Electronically Signed   By: Logan Bores M.D.   On: 12/07/2017 14:25   US Abdomen Limited Ruq  Result Date: 12/07/2017 CLINICAL DATA:  Elevated liver  functions with elevated bilirubin, alkaline phosphatase, and lactic acid. History of hypertension. EXAM: ULTRASOUND ABDOMEN LIMITED RIGHT UPPER QUADRANT COMPARISON:  CT abdomen and pelvis 12/07/2017 FINDINGS: Gallbladder: Cholelithiasis with small stones measuring up to 6 mm  diameter. Layering sludge in the gallbladder. There is diffuse gallbladder wall thickness and pericholecystic edema. Murphy's sign is negative. Common bile duct: Diameter: 4.8 mm, normal Liver: No focal lesion identified. Within normal limits in parenchymal echogenicity. Portal vein is patent on color Doppler imaging. During real-time imaging, there is evidence of reversal of flow direction with to and fro flow identified. Small amount of free fluid in the upper abdomen, likely ascites. IMPRESSION: 1. Cholelithiasis with gallbladder sludge, gallbladder wall thickening, and pericholecystic edema. Negative Murphy's sign. Changes are nonspecific and could be due to cholecystitis, liver disease, ascites, or heart failure. 2. Intermittent reversal of flow demonstrated in the portal veins. This may be due to liver disease or cirrhosis. 3. Mild ascites. Electronically Signed   By: Lucienne Capers M.D.   On: 12/07/2017 23:23    Labs:  CBC: Recent Labs    12/07/17 0928 12/07/17 1733 12/07/17 2057 12/08/17 0558 12/08/17 1500 12/09/17 0407  WBC 13.1* 12.3*  --  11.7*  --  6.5  HGB 11.2* 10.0*  --  9.3*  --  8.9*  HCT 40.0 35.0* 33.0* 31.8*  --  28.8*  PLT 150 166  --  125* 111* 93*    COAGS: Recent Labs    12/08/17 0920  12/08/17 1500 12/08/17 1700 12/09/17 0407 12/09/17 1115  INR 7.71*   < > 7.37* 5.79* 4.38* 3.59  APTT 81*  --  86*  --  52*  --    < > = values in this interval not displayed.    BMP: Recent Labs    12/07/17 1733 12/08/17 1340 12/09/17 0407 12/10/17 0553  NA 134* 139 143 150*  K 4.3 3.4* 2.9* 3.0*  CL 99* 101 100* 102  CO2 12* 26 30 35*  GLUCOSE 101* 105* 94 100*  BUN 53* 60* 53* 41*  CALCIUM  9.2 8.6* 8.4* 8.7*  CREATININE 3.21* 3.15* 2.39* 1.90*  GFRNONAA 17* 17* 24* 32*  GFRAA 20* 20* 28* 37*    LIVER FUNCTION TESTS: Recent Labs    12/08/17 1700 12/09/17 0407 12/09/17 1743 12/10/17 0553  BILITOT 7.1* 7.4* 8.8* 9.5*  AST 1,163* 882* 740* 616*  ALT 739* 658* 620* 551*  ALKPHOS 55 57 59 49  PROT 6.1* 6.3* 6.3* 6.3*  ALBUMIN 2.7* 2.8* 2.9* 2.8*    Assessment and Plan:  Perc chole drain placed in IR 6/24 Bloody bile OP Will follow Plan to keep drain inplace 6-8 weeks Plan per CCM  Electronically Signed: Miu Chiong A, PA-C 12/10/2017, 9:20 AM   I spent a total of 15 Minutes at the the patient's bedside AND on the patient's hospital floor or unit, greater than 50% of which was counseling/coordinating care for perc chole

## 2017-12-10 NOTE — Evaluation (Signed)
Physical Therapy Evaluation Patient Details Name: Jacob Shannon MRN: [id number] DOB: 02/10/1941 Today's Date: 12/10/2017   History of Present Illness  Patient is a 77 year old male with past medical history of A. fib on Pradaxa, chronic diastolic CHF, nonobstructive coronary disease, chronic back pain lumbar spinal stenosis who presented to the emergency department from home  after he was brought by his family member for the evaluation of lethargy, somnolence and poor appetite; noting afib with RVR, and acute on chronic kidney disease; cholecystitis with Perc Chole Drain placed 6/24  Clinical Impression   Pt admitted with above diagnosis. Pt currently with functional limitations due to the deficits listed below (see PT Problem List). Pt presents with continued decr level of arousal and decr responsiveness; Did not follow commands, and eyes fluttered open then closed again twice during session; Will pick up for a trial of PT, depending on ability to participate; I anticipate the need for post-acute rehab, likely SNF; Will need more info re: PLOF and available assist at home;  Pt will benefit from skilled PT to increase their independence and safety with mobility to allow discharge to the venue listed below.       Follow Up Recommendations SNF;Supervision/Assistance - 24 hour    Equipment Recommendations  Other (comment)(To be determined)    Recommendations for Other Services       Precautions / Restrictions Precautions Precautions: Fall      Mobility  Bed Mobility Overal bed mobility: Needs Assistance Bed Mobility: Supine to Sit;Sit to Supine     Supine to sit: Total assist;+2 for physical assistance Sit to supine: Total assist;+2 for physical assistance   General bed mobility comments: Total assist for all aspects of bed mobility  Transfers                 General transfer comment: Not tested due to decr arousal  Ambulation/Gait                Stairs             Wheelchair Mobility    Modified Rankin (Stroke Patients Only)       Balance Overall balance assessment: Needs assistance Sitting-balance support: Feet supported Sitting balance-Leahy Scale: Zero Sitting balance - Comments: Max to total support for balance sitting EOB                                     Pertinent Vitals/Pain Pain Assessment: Faces Faces Pain Scale: No hurt    Home Living Family/patient expects to be discharged to:: Unsure Living Arrangements: Spouse/significant other               Additional Comments: Per Rn documentation, pt lives with spouse; unable to obtain any information re: home assist or PLOF from pt    Prior Function           Comments: Per Rn documentation, pt lives with spouse; unable to obtain any information re: home assist or PLOF from pt     Hand Dominance        Extremity/Trunk Assessment   Upper Extremity Assessment Upper Extremity Assessment: Difficult to assess due to impaired cognition(No volitional movement noted)    Lower Extremity Assessment Lower Extremity Assessment: Difficult to assess due to impaired cognition(No volitional movement noted)       Communication   Communication: Receptive difficulties;Expressive difficulties  Cognition Arousal/Alertness: Lethargic   Overall Cognitive Status:  Difficult to assess                                 General Comments: Eyes closed the vast majority of session with L eye briefly fluttering open upon getting to EOB and laying back down      General Comments General comments (skin integrity, edema, etc.): VSS    Exercises     Assessment/Plan    PT Assessment Patient needs continued PT services  PT Problem List Decreased strength;Decreased range of motion;Decreased activity tolerance;Decreased balance;Decreased mobility;Decreased cognition;Decreased knowledge of use of DME;Decreased safety awareness;Decreased knowledge of  precautions;Cardiopulmonary status limiting activity       PT Treatment Interventions DME instruction;Gait training;Functional mobility training;Therapeutic activities;Therapeutic exercise;Balance training;Neuromuscular re-education;Cognitive remediation;Patient/family education    PT Goals (Current goals can be found in the Care Plan section)  Acute Rehab PT Goals Patient Stated Goal: did not state PT Goal Formulation: Patient unable to participate in goal setting Time For Goal Achievement: 12/24/17 Potential to Achieve Goals: Fair    Frequency Min 2X/week   Barriers to discharge   Need mroe information re: home situation and available assist at home     Co-evaluation               AM-PAC PT "6 Clicks" Daily Activity  Outcome Measure Difficulty turning over in bed (including adjusting bedclothes, sheets and blankets)?: Unable Difficulty moving from lying on back to sitting on the side of the bed? : Unable Difficulty sitting down on and standing up from a chair with arms (e.g., wheelchair, bedside commode, etc,.)?: Unable Help needed moving to and from a bed to chair (including a wheelchair)?: Total Help needed walking in hospital room?: Total Help needed climbing 3-5 steps with a railing? : Total 6 Click Score: 6    End of Session Equipment Utilized During Treatment: Oxygen(bed pad) Activity Tolerance: Patient tolerated treatment well Patient left: in bed;with call bell/phone within reach;with bed alarm set Nurse Communication: Mobility status PT Visit Diagnosis: Other abnormalities of gait and mobility (R26.89);Muscle weakness (generalized) (M62.81)    Time: 7588-3254 PT Time Calculation (min) (ACUTE ONLY): 17 min   Charges:   PT Evaluation $PT Eval Moderate Complexity: 1 Mod     PT G Codes:        Jacob Shannon, PT  Acute Rehabilitation Services Pager [phone] Office [phone]   [place] 12/10/2017, 4:00 PM

## 2017-12-10 NOTE — Procedures (Signed)
Signed                A         The patient has gotten progressively more obtunded through the last 24 hours. His BP is adequate and o2 sat good. He is not arousable when stirred and will not open his eyes. At t8imes he may be having short apneic episodes. I made a decisiion to intubate the patient . I spoke to his eldest son Sonia Side and explained the situation to him. It is not entirely clear what is driving his obtundation. The patient was administered 30mg  Etomidate pre-procedure. We used the glide scope to view the airway. The glottic was covered with dried concretions (mucious ) obstructing a good part of the airway. I used the tip of the suction catheter to move it aside and visulaize the cords. Once the cords waere visualized it was not difficult to insert the ET tube. Optimal position was ascertained by capnometry and bilateral BS. CXR is pending  The patient tolerated the procedure well

## 2017-12-10 NOTE — Progress Notes (Addendum)
PULMONARY / CRITICAL CARE MEDICINE   Name: Jacob Shannon MRN: [id number] DOB: 1941/01/12    ADMISSION DATE:  12/09/2017 CONSULTATION DATE:  12/07/2017  REFERRING MD:  [facility] Dr. Myna Hidalgo  CHIEF COMPLAINT:  Hypoxic/lethargic   Brief HPI:   Jacob Shannon is a 77 y.o. male with new onset altered mental status who was admitted with A-fib RVR and required diltiazem infusion to control. Over the last 24-48hrs has become more lethargic and also complaining of gastric discomfort and pain. Laboratory assessment has been an issue as he has vasculitis and poor access. Additionally, the patient has a worsening renal function with metabolic acidosis on recent ABG.   SUBJECTIVE:  Status post interventional radiology place and drain.  Remains lethargic.  Ammonia levels noted to be 50.  VITAL SIGNS: BP 111/86   Pulse (!) 106   Temp 98.4 F (36.9 C) (Axillary)   Resp (!) 24   Ht 5\' 8"  (1.727 m)   Wt 89.3 kg (196 lb 13.9 oz)   SpO2 95%   BMI 29.93 kg/m   HEMODYNAMICS: CVP:  [15 mmHg-19 mmHg] 15 mmHg  INTAKE / OUTPUT: I/O last 3 completed shifts: In: 3810.4 [I.V.:1651; Blood:1746; Other:5; IV Piggyback:408.4] Out: 39 [Urine:5225; Drains:55]  PHYSICAL EXAMINATION: General: Frail elderly male poorly responsive. HEENT: Oral mucosa is dry, no JVD lymphadenopathy is appreciated.  Pupils equal reactive Neuro: Grunts and groans to noxious stimuli CV: Heart sounds are regular atrial fibrillation with controlled ventricular rate of 93 PULM: Decreased breath sounds at bases, periods of apnea, respiratory his respirations at times. GI: Tender to touch, right percutaneous drain right outer quadrant noted Extremities: warm/dry, positive edema, bilateral bunions are noted Skin: no rashes or lesions   LABS:  BMET Recent Labs  Lab 12/08/17 1340 12/09/17 0407 12/10/17 0553  NA 139 143 150*  K 3.4* 2.9* 3.0*  CL 101 100* 102  CO2 26 30 35*  BUN 60* 53* 41*  CREATININE 3.15* 2.39* 1.90*  GLUCOSE  105* 94 100*    Electrolytes Recent Labs  Lab 12/07/17 1733 12/08/17 1340 12/09/17 0407 12/10/17 0553  CALCIUM 9.2 8.6* 8.4* 8.7*  MG 2.5*  --   --   --   PHOS 8.4*  --   --   --     CBC Recent Labs  Lab 12/07/17 1733 12/07/17 2057 12/08/17 0558 12/08/17 1500 12/09/17 0407  WBC 12.3*  --  11.7*  --  6.5  HGB 10.0*  --  9.3*  --  8.9*  HCT 35.0* 33.0* 31.8*  --  28.8*  PLT 166  --  125* 111* 93*    Coag's Recent Labs  Lab 12/08/17 0920  12/08/17 1500 12/08/17 1700 12/09/17 0407 12/09/17 1115  APTT 81*  --  86*  --  52*  --   INR 7.71*   < > 7.37* 5.79* 4.38* 3.59   < > = values in this interval not displayed.    Sepsis Markers Recent Labs  Lab 12/07/17 1733  12/08/17 0000 12/08/17 0558 12/08/17 1728 12/09/17 0407  LATICACIDVEN 11.7*   < > 9.9*  --  2.5* 2.1*  PROCALCITON 2.63  --   --  3.11  --  2.11   < > = values in this interval not displayed.    ABG Recent Labs  Lab 12/07/17 2235 12/08/17 0505 12/08/17 1737  PHART 7.291* 7.374 7.488*  PCO2ART 22.8* 29.8* 35.1  PO2ART 148* 117* 103.0    Liver Enzymes Recent Labs  Lab 12/09/17  0407 12/09/17 1743 12/10/17 0553  AST 882* 740* 616*  ALT 658* 620* 551*  ALKPHOS 57 59 49  BILITOT 7.4* 8.8* 9.5*  ALBUMIN 2.8* 2.9* 2.8*    Cardiac Enzymes Recent Labs  Lab 11/30/2017 2150 12/07/17 1733 12/08/17 1700  TROPONINI 0.05* 0.18* 0.12*    Glucose Recent Labs  Lab 12/09/17 0821 12/09/17 1605 12/09/17 2005 12/09/17 2346 12/10/17 0424 12/10/17 0805  GLUCAP 82 91 96 91 89 94    Imaging Ir Perc Cholecystostomy  Result Date: 12/09/2017 INDICATION: 78 year old male with liver function, multiorgan dysfunction, and possible cholecystitis. The patient has had elevated INR, and referred for percutaneous cholecystostomy. Given the patient's ICU status and possibility of acute cholecystitis contributing, drainage of gallbladder is reasonable. EXAM: CHOLECYSTOSTOMY MEDICATIONS: In house  antibiotics ANESTHESIA/SEDATION: Moderate (conscious) sedation was employed during this procedure. A total of Versed 0.5 mg and Fentanyl 50 mcg was administered intravenously. Moderate Sedation Time: 13 minutes. The patient's level of consciousness and vital signs were monitored continuously by radiology nursing throughout the procedure under my direct supervision. FLUOROSCOPY TIME:  Fluoroscopy Time: 0 minutes 36 seconds (4 mGy). COMPLICATIONS: None PROCEDURE: Informed written consent was obtained from the patient and the patient's family after a thorough discussion of the procedural risks, benefits and alternatives. All questions were addressed. Maximal Sterile Barrier Technique was utilized including caps, mask, sterile gowns, sterile gloves, sterile drape, hand hygiene and skin antiseptic. A timeout was performed prior to the initiation of the procedure. Ultrasound survey of the right upper quadrant was performed for planning purposes. Once the patient is prepped and draped in the usual sterile fashion, the skin and subcutaneous tissues overlying the gallbladder were generously infiltrated 1% lidocaine for local anesthesia. A coaxial needle was advanced under ultrasound guidance through the skin subcutaneous tissues and a small segment of liver into the gallbladder lumen. With removal of the stylet, spontaneous dark bile drainage occurred. Using modified Seldinger technique, a 10 French drain was placed into the gallbladder fossa, with aspiration of the sample for the lab. Contrast injection confirmed position of the tube within the gallbladder lumen. Drainage catheter was attached to gravity drain with a suture retention placed. Patient tolerated the procedure well and remained hemodynamically stable throughout. No complications were encountered and no significant blood loss encountered. IMPRESSION: Status post percutaneous cholecystostomy. Signed, Dulcy Fanny. Dellia Nims, RPVI Vascular [facility] Interventional Radiology  [facility] Radiology Electronically Signed   By: Corrie Mckusick D.O.   On: 12/09/2017 14:01     STUDIES:  CTH 6/21>>NAICA, old infarct CT A/P 6/22>>Mild ascites with cholelithiasis and possible sludge  HIDA 6/23>>1. Nonvisualization of the gallbladder and small bowel with no evidence of radiotracer within the common bile duct. Persistent activity noted throughout the liver. Findings support hepatocellular dysfunction, with depressed hepatic excretion of bile. 2. Acute cholecystitis is not excluded.   CULTURES: BCx 6/22>>NGTD 12/09/2017 percutaneous drainage fluid>>  ANTIBIOTICS: Vancomycin 6/21>>6/22 Zosyn 6/21>>>  SIGNIFICANT EVENTS: -INR elevated >10 and treated with Vit K/Praxbind. Repeat 3>>>7 -LFTs continue to worsen  -IR placed right Foley drain 12/09/2017  LINES/TUBES: CVC RIJ 6/22>>  DISCUSSION: Jacob Shannon is a 77 y.o. male with acute hepatic failure with unclear etiology and associated AKI who initially presented with A-fib with RVR. Significantly coagulopathic that was reversed with Vit K and Praxbind. Continues to have worsening LFTs and underwent CT of A/P that revealed possible cholecystitis that was then confirmed with HIDA scan. IR on board and may take down for percutaneous drainage once INR  improves. Receiving FFP and reaching out to Hepatology for further assistance in workup/managment. Hemodynamically remains stable with MAP 80-90 and only on minimal supplemental oxygen.  12/09/2017 are placed percutaneous drain on the right.  His mental status continues to be lethargic his ammonia level is 50.  Question component of obstructive sleep apnea  ASSESSMENT / PLAN:  PULMONARY A: Minimally hypoxic  Suspected underlying OSA P:   Oxygen as needed BiPAP not an option with altered mental status   CARDIOVASCULAR A:  Afib with RVR, resolved  dCHF with elevated CVP throughout the day  SIRS  P:  PRN Cardizem Metoprolol Repeat Lasix 12/10/2017  after potassium replacement   RENAL Lab Results  Component Value Date   CREATININE 1.90 (H) 12/10/2017   CREATININE 2.39 (H) 12/09/2017   CREATININE 3.15 (H) 12/08/2017   CREATININE 0.99 09/09/2015   Recent Labs  Lab 12/08/17 1340 12/09/17 0407 12/10/17 0553  K 3.4* 2.9* 3.0*    A:   AKI with metabolic acidosis - improving  Hypokalemia  P:   Follow chemistries Electrolytes replacement as needed Remains on bicarbonate drip at 50 may be able to discontinue CO2 is now 35  GASTROINTESTINAL A:   Acute liver failure, unclear etiology  Cholecystitis  P:   Percutaneous drain placed on 12/09/2017   HEMATOLOGIC Lab Results  Component Value Date   INR 3.59 12/09/2017   INR 4.38 (HH) 12/09/2017   INR 5.79 (HH) 12/08/2017    A:   Coagulopathy  P:  Status post FFP infusion for IR to place drain Status post vitamin K infusion Continue to monitor INR   INFECTIOUS A:   Cholecystitis  P:   Percutaneous drain placed per IR on 12/09/2017 Continue n.p.o. Continue antimicrobial therapy Monitor culture data, white blood cell count, temperature curve  ENDOCRINE CBG (last 3)  Recent Labs    12/09/17 2346 12/10/17 0424 12/10/17 0805  GLUCAP 91 89 94    A:   Hypoglycemia, resolved    P:   Monitor for sliding scale insulin protocol  NEUROLOGIC A:   AMS P:   Elevated ammonia on 12/09/2017 Supportive care    FAMILY  - Updates: 12/10/2017 no family at bedside - Inter-disciplinary family meet or Palliative Care meeting due by:  6/25  App cct 30 min  Richardson Landry Adalaya Irion ACNP Maryanna Shape PCCM Pager [phone] till 1 pm If no answer page [phone] 12/10/2017, 9:48 AM

## 2017-12-11 ENCOUNTER — Inpatient Hospital Stay (HOSPITAL_COMMUNITY): Payer: Medicare Other

## 2017-12-11 DIAGNOSIS — R5383 Other fatigue: Secondary | ICD-10-CM

## 2017-12-11 DIAGNOSIS — G9340 Encephalopathy, unspecified: Secondary | ICD-10-CM

## 2017-12-11 LAB — POCT I-STAT 3, ART BLOOD GAS (G3+)
ACID-BASE EXCESS: 10 mmol/L — AB (ref 0.0–2.0)
Acid-Base Excess: 13 mmol/L — ABNORMAL HIGH (ref 0.0–2.0)
BICARBONATE: 34.4 mmol/L — AB (ref 20.0–28.0)
Bicarbonate: 36.5 mmol/L — ABNORMAL HIGH (ref 20.0–28.0)
O2 SAT: 100 %
O2 Saturation: 100 %
PCO2 ART: 39.6 mmHg (ref 32.0–48.0)
Patient temperature: 97.2
TCO2: 38 mmol/L — ABNORMAL HIGH (ref 22–32)
TCO2: 36 mmol/L — AB (ref 22–32)
pCO2 arterial: 42 mmHg (ref 32.0–48.0)
pH, Arterial: 7.569 — ABNORMAL HIGH (ref 7.350–7.450)
pH, Arterial: 7.519 — ABNORMAL HIGH (ref 7.350–7.450)
pO2, Arterial: 147 mmHg — ABNORMAL HIGH (ref 83.0–108.0)
pO2, Arterial: 150 mmHg — ABNORMAL HIGH (ref 83.0–108.0)

## 2017-12-11 LAB — CBC WITH DIFFERENTIAL/PLATELET
ABS IMMATURE GRANULOCYTES: 0.1 10*3/uL (ref 0.0–0.1)
BASOS PCT: 1 %
Basophils Absolute: 0 10*3/uL (ref 0.0–0.1)
EOS PCT: 2 %
Eosinophils Absolute: 0.1 10*3/uL (ref 0.0–0.7)
HCT: 30.6 % — ABNORMAL LOW (ref 39.0–52.0)
HEMOGLOBIN: 8.8 g/dL — AB (ref 13.0–17.0)
Immature Granulocytes: 1 %
LYMPHS PCT: 2 %
Lymphs Abs: 0.2 10*3/uL — ABNORMAL LOW (ref 0.7–4.0)
MCH: 27.3 pg (ref 26.0–34.0)
MCHC: 28.8 g/dL — AB (ref 30.0–36.0)
MCV: 95 fL (ref 78.0–100.0)
Monocytes Absolute: 0.5 10*3/uL (ref 0.1–1.0)
Monocytes Relative: 8 %
NEUTROS ABS: 5.5 10*3/uL (ref 1.7–7.7)
Neutrophils Relative %: 86 %
PLATELETS: 64 10*3/uL — AB (ref 150–400)
RBC: 3.22 MIL/uL — AB (ref 4.22–5.81)
RDW: 20.2 % — ABNORMAL HIGH (ref 11.5–15.5)
WBC: 6.3 10*3/uL (ref 4.0–10.5)

## 2017-12-11 LAB — HEPATIC FUNCTION PANEL
ALBUMIN: 2.3 g/dL — AB (ref 3.5–5.0)
ALT: 438 U/L — ABNORMAL HIGH (ref 0–44)
ALT: 369 U/L — ABNORMAL HIGH (ref 0–44)
AST: 403 U/L — ABNORMAL HIGH (ref 15–41)
AST: 293 U/L — ABNORMAL HIGH (ref 15–41)
Albumin: 2.4 g/dL — ABNORMAL LOW (ref 3.5–5.0)
Alkaline Phosphatase: 47 U/L (ref 38–126)
Alkaline Phosphatase: 48 U/L (ref 38–126)
BILIRUBIN DIRECT: 6.1 mg/dL — AB (ref 0.0–0.2)
BILIRUBIN DIRECT: 7.1 mg/dL — AB (ref 0.0–0.2)
BILIRUBIN TOTAL: 13.1 mg/dL — AB (ref 0.3–1.2)
Indirect Bilirubin: 5.8 mg/dL — ABNORMAL HIGH (ref 0.3–0.9)
Indirect Bilirubin: 6 mg/dL — ABNORMAL HIGH (ref 0.3–0.9)
TOTAL PROTEIN: 5.6 g/dL — AB (ref 6.5–8.1)
Total Bilirubin: 11.9 mg/dL — ABNORMAL HIGH (ref 0.3–1.2)
Total Protein: 6.5 g/dL (ref 6.5–8.1)

## 2017-12-11 LAB — PHOSPHORUS: PHOSPHORUS: 1.5 mg/dL — AB (ref 2.5–4.6)

## 2017-12-11 LAB — GLUCOSE, CAPILLARY
GLUCOSE-CAPILLARY: 67 mg/dL — AB (ref 70–99)
GLUCOSE-CAPILLARY: 119 mg/dL — AB (ref 70–99)
GLUCOSE-CAPILLARY: 107 mg/dL — AB (ref 70–99)
GLUCOSE-CAPILLARY: 136 mg/dL — AB (ref 70–99)
Glucose-Capillary: 141 mg/dL — ABNORMAL HIGH (ref 70–99)
Glucose-Capillary: 85 mg/dL (ref 70–99)
Glucose-Capillary: 90 mg/dL (ref 70–99)

## 2017-12-11 LAB — BASIC METABOLIC PANEL
Anion gap: 11 (ref 5–15)
Anion gap: 8 (ref 5–15)
BUN: 32 mg/dL — ABNORMAL HIGH (ref 8–23)
BUN: 27 mg/dL — AB (ref 8–23)
CALCIUM: 8.3 mg/dL — AB (ref 8.9–10.3)
CO2: 34 mmol/L — ABNORMAL HIGH (ref 22–32)
CO2: 35 mmol/L — AB (ref 22–32)
CREATININE: 1.45 mg/dL — AB (ref 0.61–1.24)
Calcium: 8.5 mg/dL — ABNORMAL LOW (ref 8.9–10.3)
Chloride: 109 mmol/L (ref 98–111)
Chloride: 110 mmol/L (ref 98–111)
Creatinine, Ser: 1.58 mg/dL — ABNORMAL HIGH (ref 0.61–1.24)
GFR calc Af Amer: 52 mL/min — ABNORMAL LOW (ref >60)
GFR calc non Af Amer: 40 mL/min — ABNORMAL LOW (ref >60)
GFR calc non Af Amer: 45 mL/min — ABNORMAL LOW (ref >60)
GFR, EST AFRICAN AMERICAN: 47 mL/min — AB (ref >60)
GLUCOSE: 166 mg/dL — AB (ref 70–99)
Glucose, Bld: 126 mg/dL — ABNORMAL HIGH (ref 70–99)
Potassium: 2.8 mmol/L — ABNORMAL LOW (ref 3.5–5.1)
Potassium: 3.4 mmol/L — ABNORMAL LOW (ref 3.5–5.1)
SODIUM: 154 mmol/L — AB (ref 135–145)
Sodium: 153 mmol/L — ABNORMAL HIGH (ref 135–145)

## 2017-12-11 LAB — T4, FREE: FREE T4: 1.14 ng/dL (ref 0.82–1.77)

## 2017-12-11 LAB — PROTIME-INR
INR: 2.48
PROTHROMBIN TIME: 26.7 s — AB (ref 11.4–15.2)

## 2017-12-11 LAB — MAGNESIUM: Magnesium: 2.2 mg/dL (ref 1.7–2.4)

## 2017-12-11 LAB — TSH: TSH: 1.322 u[IU]/mL (ref 0.350–4.500)

## 2017-12-11 MED ORDER — DEXTROSE 50 % IV SOLN
25.0000 mL | Freq: Once | INTRAVENOUS | Status: AC
  Administered 2017-12-11: 25 mL via INTRAVENOUS
  Filled 2017-12-11: qty 50

## 2017-12-11 MED ORDER — LACTULOSE 10 GM/15ML PO SOLN
20.0000 g | Freq: Two times a day (BID) | ORAL | Status: DC
  Administered 2017-12-11 – 2017-12-12 (×3): 20 g via ORAL
  Filled 2017-12-11 (×3): qty 30

## 2017-12-11 MED ORDER — VITAL AF 1.2 CAL PO LIQD
1000.0000 mL | ORAL | Status: DC
  Administered 2017-12-11 – 2017-12-14 (×4): 1000 mL

## 2017-12-11 MED ORDER — POTASSIUM PHOSPHATES 15 MMOLE/5ML IV SOLN
30.0000 mmol | Freq: Once | INTRAVENOUS | Status: AC
  Administered 2017-12-11: 30 mmol via INTRAVENOUS
  Filled 2017-12-11: qty 10

## 2017-12-11 MED ORDER — VITAL HIGH PROTEIN PO LIQD: 1000.0000 mL | ORAL | Status: DC

## 2017-12-11 MED ORDER — POTASSIUM CHLORIDE 2 MEQ/ML IV SOLN
INTRAVENOUS | Status: DC
  Administered 2017-12-11 (×2): via INTRAVENOUS
  Filled 2017-12-11 (×4): qty 1000

## 2017-12-11 MED ORDER — DEXTROSE 5 % IV SOLN: INTRAVENOUS | Status: DC

## 2017-12-11 MED ORDER — IPRATROPIUM-ALBUTEROL 0.5-2.5 (3) MG/3ML IN SOLN
3.0000 mL | RESPIRATORY_TRACT | Status: DC
  Administered 2017-12-11 – 2017-12-12 (×10): 3 mL via RESPIRATORY_TRACT
  Filled 2017-12-11 (×9): qty 3

## 2017-12-11 MED ORDER — IPRATROPIUM-ALBUTEROL 0.5-2.5 (3) MG/3ML IN SOLN
RESPIRATORY_TRACT | Status: AC
  Filled 2017-12-11: qty 3

## 2017-12-11 MED ORDER — DOCUSATE SODIUM 50 MG/5ML PO LIQD
100.0000 mg | Freq: Every day | ORAL | Status: DC
  Administered 2017-12-11 – 2017-12-12 (×2): 100 mg
  Filled 2017-12-11 (×2): qty 10

## 2017-12-11 MED ORDER — POTASSIUM CHLORIDE 10 MEQ/100ML IV SOLN
10.0000 meq | INTRAVENOUS | Status: AC
  Administered 2017-12-11 (×3): 10 meq via INTRAVENOUS
  Filled 2017-12-11 (×3): qty 100

## 2017-12-11 MED ORDER — PRO-STAT SUGAR FREE PO LIQD
60.0000 mL | Freq: Two times a day (BID) | ORAL | Status: DC
  Administered 2017-12-11 – 2017-12-15 (×9): 60 mL
  Filled 2017-12-11 (×9): qty 60

## 2017-12-11 MED ORDER — CHLORHEXIDINE GLUCONATE CLOTH 2 % EX PADS
6.0000 | MEDICATED_PAD | Freq: Every day | CUTANEOUS | Status: DC
  Administered 2017-12-11 – 2017-12-15 (×4): 6 via TOPICAL

## 2017-12-11 NOTE — Consult Note (Addendum)
NEURO HOSPITALIST CONSULT NOTE   Requestig physician: Dr. Debbora Dus   Reason for Consult: encephalopathy   History obtained from: Heart  HPI:                                                                                                                                          Jacob Shannon is an 77 y.o. male past medical history of atrial fibrillation who is on Pradaxa, chronic diastolic congestive heart failure, who presented emergency department for evaluation of lethargy, somnolence, and poor appetite.  Per note patient was recently started by neurology on Cymbalta and Ultram for low back pain shortly after starting these medications patient developed lethargy, somnolence and poor appetite.  Patient has remained with altered mental status and yesterday apparently had a worsening of mental status requiring intubation.  However as noted below patient has multiple metabolic issues that have not resolved.  Including elevated AST, ALT, sodium, creatinine, ammonia.  Neurology was asked to evaluate patient for further information about possible etiologies of encephalopathy.  Of note I looked at the adverse reactions of Cymbalta and Ultram and I do not see any adverse reactions that could cause her function versus worsening of renal function.  However if you look at colchicine it can cause hepatotoxicity.  Labs of importance: On arrival AST was 740 over the last 2 days it has decreased to 403 On arrival ALT was 620 over the last 2 days is decreased to 438 Sodium 154 Potassium 2.8 BUN 32 Creatinine 1.58 Ammonia 4 days ago is 84 now decreased to 45  Past Medical History:  Diagnosis Date  . Chronic atrial fibrillation ([place])   . Complication of anesthesia    pt states that  "i'm hard to wake up"  . Diastolic heart failure ([place])   . Essential hypertension   . Gout   . Hypercholesterolemia   . Leukocytoclastic vasculitis ([place])   . Mitral regurgitation    Moderate  August 2016  . Secondary pulmonary hypertension    RVSP 49 mmHg August 2016  . Spinal stenosis   . 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 CARDIAC CATHETERIZATION N/A 08/26/2015   Procedure: Right/Left Heart Cath and Coronary Angiography;  Surgeon: Peter M Martinique, MD;  Location: [facility];  Service: Cardiovascular;  Laterality: N/A;  . CATARACT EXTRACTION Bilateral   . COLONOSCOPY    . IR PERC CHOLECYSTOSTOMY  12/09/2017  . LEG SKIN LESION  BIOPSY / EXCISION Right 2010  . TEE WITHOUT CARDIOVERSION N/A 08/25/2015   Procedure: TRANSESOPHAGEAL ECHOCARDIOGRAM (TEE);  Surgeon: Sanda Klein, MD;  Location: [facility];  Service: Cardiovascular;  Laterality: N/A;    Family  History  Problem Relation Age of Onset  . Heart attack Father   . Coronary artery disease Father   . Colon cancer Brother   . Coronary artery disease Brother   . Stomach cancer Brother   . Lung disease Neg Hx   . Rheumatologic disease Neg Hx               Social History:  reports that he quit smoking about 23 years ago. His smoking use included cigarettes. He started smoking about 61 years ago. He has a 57.00 pack-year smoking history. He has never used smokeless tobacco. He reports that he drinks alcohol. He reports that he does not use drugs.  Allergies  Allergen Reactions  . Tramadol Nausea And Vomiting    MEDICATIONS:                                                                                                                     Prior to Admission:  Medications Prior to Admission  Medication Sig Dispense Refill Last Dose  . colchicine 0.6 MG tablet Take 0.6 mg by mouth 2 (two) times daily.    11/29/2017 at am  . dabigatran (PRADAXA) 150 MG CAPS capsule Take 150 mg by mouth 2 (two) times daily.   12/09/2017 at 0800  . docusate sodium (COLACE) 100 MG capsule Take 100 mg by mouth daily.   12/12/2017 at Unknown time  .  DULoxetine (CYMBALTA) 30 MG capsule Take 30 mg by mouth daily.   Unk at [facility]  . metoprolol tartrate (LOPRESSOR) 25 MG tablet Take 12.5-25 mg by mouth See admin instructions. Take 12.5 mg by mouth in the morning and 25 mg in the evening   11/20/2017 at 0800  . potassium chloride SA (K-DUR,KLOR-CON) 20 MEQ tablet Take 20 mEq by mouth 2 (two) times daily.   11/29/2017 at am  . torsemide (DEMADEX) 20 MG tablet Take 40-60 mg by mouth See admin instructions. Take 60 mg by mouth in the morning and 40 mg midday for fluid/edema 60 tablet 3 12/12/2017 at am  . metoprolol succinate (TOPROL-XL) 25 MG 24 hr tablet Take 1 tablet (25 mg total) by mouth 3 (three) times daily. PLEASE CONTACT OFFICE FOR ADDITIONAL REFILLS (Patient not taking: Reported on 11/27/2017) 90 tablet 0 Not Taking at Unknown time   Scheduled: . sodium chloride   Intravenous Once  . chlorhexidine gluconate (MEDLINE KIT)  15 mL Mouth Rinse BID  . Chlorhexidine Gluconate Cloth  6 each Topical Daily  . docusate  100 mg Per Tube Daily  . ipratropium-albuterol  3 mL Nebulization Q4H  . ipratropium-albuterol      . lidocaine  1 patch Transdermal Q24H  . mouth rinse  15 mL Mouth Rinse 10 times per day  . sodium chloride flush  10-40 mL Intracatheter Q12H  . sodium chloride flush  5 mL Intracatheter Q8H   Continuous: . sodium chloride 500 mL (12/11/17 0614)  . dextrose 5 % with kcl    . diltiazem (CARDIZEM)  infusion Stopped (12/11/17 0027)  . famotidine (PEPCID) IV 20 mg (12/11/17 1026)  . lactated ringers 50 mL/hr at 12/11/17 0600  . piperacillin-tazobactam (ZOSYN)  IV 3.375 g (12/11/17 0616)  . potassium chloride 10 mEq (12/11/17 1025)  . potassium PHOSPHATE IVPB (in mmol)    . propofol (DIPRIVAN) infusion 20 mcg/kg/min (12/11/17 0815)     ROS:                                                                                                                                       History obtained from unobtainable from patient due to mental  status and intubated     Blood pressure 101/68, pulse 80, temperature (!) 96.9 F (36.1 C), temperature source Axillary, resp. rate 14, height 5' 8" (1.727 m), weight 87.4 kg (192 lb 10.9 oz), SpO2 100 %.   General Examination:                                                                                                       Physical Exam  HEENT-  Normocephalic, no lesions, without obvious abnormality.  Significant jaundice of sclera cardiovascular-atrial fibrillation Extremities- Warm, dry and intact--and jaundice Musculoskeletal-no joint tenderness, significant deformities and bunions of bilateral feet Skin-warm and dry, no hyperpigmentation, vitiligo, or suspicious lesions  Neurological Examination Mental Status:  sedated on propofol.  Patient does not follow commands or open eyes.  He does wince to pain to sternal rub and finger noxious stimuli along to withdwrals Cranial Nerves: II: No blink to threat III,IV, VI: Holds eyes closed, has no doll's, pupillary's are sluggish V,VII: Face symmetric  VIII: Unable to assess  Motor: Lasted throughout does not withdrawal from pain  Sensory: To noxious stimuli of fingers, periocular region and to sternal rub he does wince to pain and withdrawls Deep Tendon Reflexes: I could not elicit any reflexes throughout. Plantars: Toes are significantly deformed however he does extend his ankle with plantar stimulus Cerebellar: Unable to assess Gait: Not tested   Lab Results: Basic Metabolic Panel: Recent Labs  Lab 12/07/17 1733 12/08/17 1340 12/09/17 0407 12/10/17 0553 12/11/17 0523  NA 134* 139 143 150* 154*  K 4.3 3.4* 2.9* 3.0* 2.8*  CL 99* 101 100* 102 109  CO2 12* 26 30 35* 34*  GLUCOSE 101* 105* 94 100* 126*  BUN 53* 60* 53* 41* 32*  CREATININE 3.21* 3.15* 2.39* 1.90* 1.58*  CALCIUM 9.2 8.6* 8.4* 8.7* 8.5*  MG 2.5*  --   --   --    2.2  PHOS 8.4*  --   --   --  1.5*    CBC: Recent Labs  Lab 12/02/2017 1131  12/07/17 0928 12/07/17 1733 12/07/17 2057 12/08/17 0558 12/08/17 1500 12/09/17 0407  WBC 8.5 13.1* 12.3*  --  11.7*  --  6.5  NEUTROABS  --  10.7* 10.3*  --   --   --  5.2  HGB 10.4* 11.2* 10.0*  --  9.3*  --  8.9*  HCT 35.7* 40.0 35.0* 33.0* 31.8*  --  28.8*  MCV 94.4 98.3 96.4  --  93.3  --  90.0  PLT 175 150 166  --  125* 111* 93*    Cardiac Enzymes: Recent Labs  Lab 11/29/2017 1609 11/23/2017 2150 12/07/17 1733 12/07/17 2057 12/08/17 1700  CKTOTAL  --   --   --  1,090*  --   TROPONINI 0.05* 0.05* 0.18*  --  0.12*    Lipid Panel: Recent Labs  Lab 12/10/17 1824  TRIG 97    Imaging: Ir Perc Cholecystostomy  Result Date: 12/09/2017 INDICATION: 76-year-old male with liver function, multiorgan dysfunction, and possible cholecystitis. The patient has had elevated INR, and referred for percutaneous cholecystostomy. Given the patient's ICU status and possibility of acute cholecystitis contributing, drainage of gallbladder is reasonable. EXAM: CHOLECYSTOSTOMY MEDICATIONS: In house antibiotics ANESTHESIA/SEDATION: Moderate (conscious) sedation was employed during this procedure. A total of Versed 0.5 mg and Fentanyl 50 mcg was administered intravenously. Moderate Sedation Time: 13 minutes. The patient's level of consciousness and vital signs were monitored continuously by radiology nursing throughout the procedure under my direct supervision. FLUOROSCOPY TIME:  Fluoroscopy Time: 0 minutes 36 seconds (4 mGy). COMPLICATIONS: None PROCEDURE: Informed written consent was obtained from the patient and the patient's family after a thorough discussion of the procedural risks, benefits and alternatives. All questions were addressed. Maximal Sterile Barrier Technique was utilized including caps, mask, sterile gowns, sterile gloves, sterile drape, hand hygiene and skin antiseptic. A timeout was performed prior to the initiation of the procedure. Ultrasound survey of the right upper quadrant was  performed for planning purposes. Once the patient is prepped and draped in the usual sterile fashion, the skin and subcutaneous tissues overlying the gallbladder were generously infiltrated 1% lidocaine for local anesthesia. A coaxial needle was advanced under ultrasound guidance through the skin subcutaneous tissues and a small segment of liver into the gallbladder lumen. With removal of the stylet, spontaneous dark bile drainage occurred. Using modified Seldinger technique, a 10 French drain was placed into the gallbladder fossa, with aspiration of the sample for the lab. Contrast injection confirmed position of the tube within the gallbladder lumen. Drainage catheter was attached to gravity drain with a suture retention placed. Patient tolerated the procedure well and remained hemodynamically stable throughout. No complications were encountered and no significant blood loss encountered. IMPRESSION: Status post percutaneous cholecystostomy. Signed, Jaime S. Wagner, DO, RPVI [facility] Electronically Signed   By: Jaime  Wagner D.O.   On: 12/09/2017 14:01   Dg Chest Port 1 View  Result Date: 12/11/2017 CLINICAL DATA:  Respiratory failure EXAM: PORTABLE CHEST 1 VIEW COMPARISON:  12/10/2017 FINDINGS: Cardiac shadow remains enlarged. Endotracheal tube, nasogastric catheter and right jugular central line are again seen and stable. Bilateral basilar atelectasis is noted and stable. Small right pleural effusion is again seen. No bony abnormality is noted. IMPRESSION: Bibasilar atelectasis and small right pleural effusion. Electronically Signed   By: Mark  Lukens M.D.   On:   12/11/2017 08:33   Dg Chest Port 1 View  Result Date: 12/10/2017 CLINICAL DATA:  Intubated EXAM: PORTABLE CHEST 1 VIEW COMPARISON:  12/07/2017 FINDINGS: Endotracheal tube well positioned in the mid trachea 5.2 cm above the carina. NG tube extends below the hemidiaphragms with the tip  not visualized. Right IJ central line tip distal SVC. Stable cardiomegaly slight worsening basilar atelectasis. Negative for edema or significant pneumonia. Trace pleural effusions. Aorta is atherosclerotic. Degenerative changes of the spine and shoulders. IMPRESSION: Endotracheal tube 5.2 cm above the carina. Cardiomegaly with slight worsening basilar atelectasis and trace pleural effusions. Electronically Signed   By: M.  Shick M.D.   On: 12/10/2017 18:44   Dg Abd Portable 1v  Result Date: 12/10/2017 CLINICAL DATA:  Orogastric tube placement. EXAM: PORTABLE ABDOMEN - 1 VIEW COMPARISON:  Abdomen and pelvis CT dated 12/07/2017. FINDINGS: Interval orogastric tube with its tip in the region of the distal gastric antrum and side hole in the mid stomach. Paucity of intestinal gas with no dilated bowel loops seen. Interval right mid abdominal pigtail catheter and left lower lobe airspace opacity. Thoracic and lower cervical spine degenerative changes. IMPRESSION: 1. Orogastric tube tip in the distal stomach. 2. Interval left lower lobe atelectasis or pneumonia. Electronically Signed   By: Steven  Reid M.D.   On: 12/10/2017 19:36    Assessment and plan per attending neurologist  Jacob Smith PA-C [facility] [phone]  12/11/2017, 10:21 AM   Assessment: 76-year-old male with acute encephalopathy in the setting of liver dysfunction, AKI, cholecystitis.  I suspect that his encephalopathy is toxic/metabolic in nature, however it is reasonable to rule out other etiologies with an MRI and EEG.  If these are negative, then I would continue to treat his underlying medical issues and his cognitive improvement may lag significantly behind his improvements and labs.  Recommendations: -EEG -MRI head without contrast -Agree with treating for hepatic encephalopathy  McNeill Kirkpatrick, MD [facility] [phone]  If 7pm- 7am, please page neurology on call as listed in AMION.  

## 2017-12-11 NOTE — Progress Notes (Signed)
[facility] Physician-Brief Progress Note Patient Name: Jacob Shannon DOB: [birth year] MRN: [id number]   Date of Service  12/11/2017  HPI/Events of Note  ABG on 100%/PRVC 16/TV 550/P 5 = 7.587/10.9/443.  eICU Interventions  Will order: 1. Decrease PRVC rate to 12. 2. ABG at 7:15 AM.     Intervention Category Major Interventions: Respiratory failure - evaluation and management  Holbert Caples [place] 12/11/2017, 5:38 AM

## 2017-12-11 NOTE — Progress Notes (Signed)
EEG completed, results pending. 

## 2017-12-11 NOTE — Procedures (Signed)
ELECTROENCEPHALOGRAM REPORT  Date of Study: 12/11/2017  Patient's Name: Jacob Shannon MRN: [id number] Date of Birth: 1941/02/03  Referring Provider: Dr. Roland Rack  Clinical History: This is a 77 year old man with altered mental status.  Medications: No AEDs or sedating medications listed  Technical Summary: A multichannel digital EEG recording measured by the international 10-20 system with electrodes applied with paste and impedances below 5000 ohms performed as portable with EKG monitoring in an intubated and unresponsive patient.  Hyperventilation and photic stimulation were not performed.  The digital EEG was referentially recorded, reformatted, and digitally filtered in a variety of bipolar and referential montages for optimal display.   Description: The patient is intubated and unresponsive during the recording. No sedating medication listed. There is no clear posterior dominant rhythm seen. The background consists of a large amount of diffuse 5-7 Hz theta and occasional 2-3 Hz delta slowing with triphasic waves seen throughout the study. There are brief 1-second periods of diffuse background suppression seen intermittently. Normal sleep architecture is not seen. There is an increase in faster frequencies and muscle artifact with noxious stimulation (gagging). Hyperventilation and photic stimulation were not performed.  There were no epileptiform discharges or electrographic seizures seen.    EKG lead showed irregular rhythm.  Impression: This EEG is abnormal due to the presence of moderate diffuse background slowing with triphasic waves.  Clinical Correlation of the above findings indicates diffuse cerebral dysfunction that is non-specific in etiology and can be seen with hypoxic/ischemic injury, toxic/metabolic encephalopathies, neurodegenerative disorders, or medication effect.  Triphasic waves are typically seen with hepatic encephalopathy, but can be seen with other  metabolic encephalopathies as well. The absence of epileptiform discharges does not rule out a clinical diagnosis of epilepsy.  Clinical correlation is advised.   Ellouise Newer, M.D.

## 2017-12-11 NOTE — Progress Notes (Signed)
Initial Nutrition Assessment  DOCUMENTATION CODES:   Non-severe (moderate) malnutrition in context of acute illness/injury  INTERVENTION:   Initiate Vital AF 1.2 @ 45 ml/hr (1080 ml/day) via OG tube 60 ml Prostat BID  Provides: 1696 kcal, 141 grams protein, and 875 ml free water.    NUTRITION DIAGNOSIS:   Moderate Malnutrition related to acute illness(acute hepatic failure/AKI) as evidenced by mild fat depletion, mild muscle depletion.  GOAL:   Patient will meet greater than or equal to 90% of their needs  MONITOR:   TF tolerance, I & O's, Labs  REASON FOR ASSESSMENT:   Consult, Ventilator Enteral/tube feeding initiation and management  ASSESSMENT:   Pt with PMH of afib, CHF, HTN, HLD admitted for encephalopathy with acute hepatic failure with unclear etiology and associated AKI. Cholecystitis confirmed with HIDA, s/p drain in IR. Intubated 6/26 for progressively more obtunded.     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 Son brings pt Breakfast which is usually egg/bacon and cheese sandwich (from his restaurant), Lunch meat, starch and veggie or sandwich (from his restaurant), Dinner at home and a cooked meal. For the last about 2 weeks pt has ate bites at these meals and complains that he was full but very thirsty and drank a lot of water.    Patient is currently intubated on ventilator support MV: 8.4 L/min Temp (24hrs), Avg:98.5 F (36.9 C), Min:96.9 F (36.1 C), Max:100.2 F (37.9 C) 37.2  Propofol: 10.7 ml/hr provides: 282 kcal; now off Medications reviewed and include: colace, 10 mEq KCl x 3, Kphos 30 mmol x 1 D5 with 40 mEq KCl @ 100 ml/hr Labs reviewed: Na 154 (H), K+ 2.8 (L), PO4 1.5 (L), BUN: 32 (H), Cr 1.58 (H), AST: 403 (H), ALT: 438 (H) Ammonia: 32-84-52-45 Chole drain: 60 ml   NUTRITION - FOCUSED PHYSICAL EXAM:    Most Recent Value  Orbital Region   Mild depletion  Upper Arm Region  Mild depletion  Thoracic and Lumbar Region  No depletion  Buccal Region  Unable to assess  Temple Region  Mild depletion  Clavicle Bone Region  Mild depletion  Clavicle and Acromion Bone Region  Mild depletion  Scapular Bone Region  Unable to assess  Dorsal Hand  Unable to assess  Patellar Region  No depletion  Anterior Thigh Region  No depletion  Posterior Calf Region  No depletion  Edema (RD Assessment)  Moderate  Hair  Reviewed  Eyes  Unable to assess  Mouth  Unable to assess  Skin  Reviewed [jaundice]  Nails  Reviewed       Diet Order:   Diet Order           Diet NPO time specified  Diet effective now          EDUCATION NEEDS:   No education needs have been identified at this time  Skin:  Skin Assessment: Reviewed RN Assessment  Last BM:  6/25 - medium, clay  Height:   Ht Readings from Last 1 Encounters:  12/03/2017 5\' 8"  (1.727 m)    Weight:   Wt Readings from Last 1 Encounters:  12/11/17 192 lb 10.9 oz (87.4 kg)    Ideal Body Weight:  70 kg  BMI:  Body mass index is 29.3 kg/m.  Estimated Nutritional Needs:   Kcal:  4097  Protein:  131-145 grams  Fluid:  > 1.7 L/day  Nira Conn  [place], [place], [place] Pager [phone] After Hours Pager

## 2017-12-11 NOTE — Progress Notes (Signed)
Referring Physician(s): CCM  Supervising Physician: Corrie Mckusick  Patient Status:  [facility] - In-pt  Chief Complaint:  Perc chole drain placed 6/24  Subjective:  Now intubated OP bile TB rising    Allergies: Tramadol  Medications: Prior to Admission medications   Medication Sig Start Date End Date Taking? Authorizing Provider  colchicine 0.6 MG tablet Take 0.6 mg by mouth 2 (two) times daily.    Yes [provider]  dabigatran (PRADAXA) 150 MG CAPS capsule Take 150 mg by mouth 2 (two) times daily.   Yes [provider]  docusate sodium (COLACE) 100 MG capsule Take 100 mg by mouth daily.   Yes [provider]  DULoxetine (CYMBALTA) 30 MG capsule Take 30 mg by mouth daily.   Yes [provider]  metoprolol tartrate (LOPRESSOR) 25 MG tablet Take 12.5-25 mg by mouth See admin instructions. Take 12.5 mg by mouth in the morning and 25 mg in the evening   Yes [provider]  potassium chloride SA (K-DUR,KLOR-CON) 20 MEQ tablet Take 20 mEq by mouth 2 (two) times daily.   Yes [provider]  torsemide (DEMADEX) 20 MG tablet Take 40-60 mg by mouth See admin instructions. Take 60 mg by mouth in the morning and 40 mg midday for fluid/edema 11/19/16  Yes Martinique, Peter M, MD  metoprolol succinate (TOPROL-XL) 25 MG 24 hr tablet Take 1 tablet (25 mg total) by mouth 3 (three) times daily. PLEASE CONTACT OFFICE FOR ADDITIONAL REFILLS Patient not taking: Reported on 11/28/2017 09/11/16   Martin[place], Peter M, MD     Vital Signs: BP 101/68   Pulse 80   Temp (!) 96.9 F (36.1 C) (Axillary)   Resp 14   Ht 5\' 8"  (1.727 m)   Wt 192 lb 10.9 oz (87.4 kg)   SpO2 100%   BMI 29.30 kg/m   Physical Exam  Constitutional:  intubated  Skin: Skin is warm and dry.  Site is clean and dry OP bile-- less bloody 60 cc yesterday 10 cc in bag  Nursing note and vitals reviewed.   Imaging: Ct Abdomen Pelvis Wo Contrast  Result Date:  12/07/2017 CLINICAL DATA:  Nausea, vomiting. EXAM: CT ABDOMEN AND PELVIS WITHOUT CONTRAST TECHNIQUE: Multidetector CT imaging of the abdomen and pelvis was performed following the standard protocol without IV contrast. COMPARISON:  CT scan of May 09, 2015. FINDINGS: Lower chest: Minimal bilateral pleural effusions are noted with adjacent subsegmental atelectasis. Hepatobiliary: Minimal cholelithiasis is noted. Liver is unremarkable on these unenhanced images. No biliary dilatation is noted. There may be gallbladder wall thickening. Pancreas: Unremarkable. No pancreatic ductal dilatation or surrounding inflammatory changes. Spleen: Mild amount of free fluid is noted around the spleen. Adrenals/Urinary Tract: Adrenal glands appear normal. Nonobstructive right renal calculus is noted. No hydronephrosis or renal obstruction is noted. Urinary bladder is unremarkable. Stomach/Bowel: The stomach is unremarkable. Status post appendectomy. There is no evidence of bowel obstruction. Vascular/Lymphatic: Aortic atherosclerosis. No enlarged abdominal or pelvic lymph nodes. Reproductive: Prostate is unremarkable. Other: Small left inguinal hernia is noted. Minimal amount of free fluid is noted in the pericolic gutters and pelvis. Musculoskeletal: No acute or significant osseous findings. IMPRESSION: Minimal bilateral pleural effusions with adjacent subsegmental atelectasis. Minimal cholelithiasis is noted. Gallbladder wall thickening cannot be excluded and further evaluation with ultrasound is recommended evaluate for possible cholecystitis. Minimal ascites is noted. Nonobstructive right renal calculus. No hydronephrosis or renal obstruction is noted. Small left inguinal hernia. Aortic Atherosclerosis (ICD10-I70.0). Electronically Signed  By: Marijo Conception, M.D.   On: 12/07/2017 19:38   Nm Hepatobiliary Liver Func  Result Date: 12/08/2017 CLINICAL DATA:  Abnormal liver function tests. Elevated bilirubin, AST and ALT.  Gallstones and gallbladder sludge on ultrasound dated 12/07/2017. EXAM: NUCLEAR MEDICINE HEPATOBILIARY IMAGING TECHNIQUE: Sequential images of the abdomen were obtained out to 60 minutes following intravenous administration of radiopharmaceutical. RADIOPHARMACEUTICALS:  7.91 mCi Tc-100m  Choletec IV COMPARISON:  CT and ultrasound, 12/07/2017. FINDINGS: There is prompt and homogeneous uptake of the radiotracer by the liver. No defects. Despite imaging for 2 hours, no radiotracer activity was seen in the common bile duct, gallbladder or small bowel. There is no decrease in liver activity over the duration of the exam. IMPRESSION: 1. Nonvisualization of the gallbladder and small bowel with no evidence of radiotracer within the common bile duct. Persistent activity noted throughout the liver. Findings support hepatocellular dysfunction, with depressed hepatic excretion of bile. 2. Acute cholecystitis is not excluded. Electronically Signed   By: Lajean Manes M.D.   On: 12/08/2017 12:33   Ir Perc Cholecystostomy  Result Date: 12/09/2017 INDICATION: 77 year old male with liver function, multiorgan dysfunction, and possible cholecystitis. The patient has had elevated INR, and referred for percutaneous cholecystostomy. Given the patient's ICU status and possibility of acute cholecystitis contributing, drainage of gallbladder is reasonable. EXAM: CHOLECYSTOSTOMY MEDICATIONS: In house antibiotics ANESTHESIA/SEDATION: Moderate (conscious) sedation was employed during this procedure. A total of Versed 0.5 mg and Fentanyl 50 mcg was administered intravenously. Moderate Sedation Time: 13 minutes. The patient's level of consciousness and vital signs were monitored continuously by radiology nursing throughout the procedure under my direct supervision. FLUOROSCOPY TIME:  Fluoroscopy Time: 0 minutes 36 seconds (4 mGy). COMPLICATIONS: None PROCEDURE: Informed written consent was obtained from the patient and the patient's family  after a thorough discussion of the procedural risks, benefits and alternatives. All questions were addressed. Maximal Sterile Barrier Technique was utilized including caps, mask, sterile gowns, sterile gloves, sterile drape, hand hygiene and skin antiseptic. A timeout was performed prior to the initiation of the procedure. Ultrasound survey of the right upper quadrant was performed for planning purposes. Once the patient is prepped and draped in the usual sterile fashion, the skin and subcutaneous tissues overlying the gallbladder were generously infiltrated 1% lidocaine for local anesthesia. A coaxial needle was advanced under ultrasound guidance through the skin subcutaneous tissues and a small segment of liver into the gallbladder lumen. With removal of the stylet, spontaneous dark bile drainage occurred. Using modified Seldinger technique, a 10 French drain was placed into the gallbladder fossa, with aspiration of the sample for the lab. Contrast injection confirmed position of the tube within the gallbladder lumen. Drainage catheter was attached to gravity drain with a suture retention placed. Patient tolerated the procedure well and remained hemodynamically stable throughout. No complications were encountered and no significant blood loss encountered. IMPRESSION: Status post percutaneous cholecystostomy. Signed, Dulcy Fanny. Dellia Nims, RPVI [facility] Electronically Signed   By: Corrie Mckusick D.O.   On: 12/09/2017 14:01   Dg Chest Port 1 View  Result Date: 12/11/2017 CLINICAL DATA:  Respiratory failure EXAM: PORTABLE CHEST 1 VIEW COMPARISON:  12/10/2017 FINDINGS: Cardiac shadow remains enlarged. Endotracheal tube, nasogastric catheter and right jugular central line are again seen and stable. Bilateral basilar atelectasis is noted and stable. Small right pleural effusion is again seen. No bony abnormality is noted. IMPRESSION: Bibasilar atelectasis  and small right pleural effusion. Electronically Signed   By:  Inez Catalina M.D.   On: 12/11/2017 08:33   Dg Chest Port 1 View  Result Date: 12/10/2017 CLINICAL DATA:  Intubated EXAM: PORTABLE CHEST 1 VIEW COMPARISON:  12/07/2017 FINDINGS: Endotracheal tube well positioned in the mid trachea 5.2 cm above the carina. NG tube extends below the hemidiaphragms with the tip not visualized. Right IJ central line tip distal SVC. Stable cardiomegaly slight worsening basilar atelectasis. Negative for edema or significant pneumonia. Trace pleural effusions. Aorta is atherosclerotic. Degenerative changes of the spine and shoulders. IMPRESSION: Endotracheal tube 5.2 cm above the carina. Cardiomegaly with slight worsening basilar atelectasis and trace pleural effusions. Electronically Signed   By: Jerilynn Mages.  Shick M.D.   On: 12/10/2017 18:44   Dg Chest Port 1 View  Result Date: 12/07/2017 CLINICAL DATA:  Central line placement. EXAM: PORTABLE CHEST 1 VIEW COMPARISON:  12/08/2017 at 1356 hours FINDINGS: New right central venous line has been inserted through the right internal jugular vein. Tip projects over the upper right heart, likely within the right atrium. No pneumothorax.  No other change from the prior study. IMPRESSION: Right internal jugular central venous line tip projects within the right atrium. Caval atrial junction likely resides 2-3 cm above this. No pneumothorax. Electronically Signed   By: Lajean Manes M.D.   On: 12/07/2017 17:29   Dg Chest Port 1v Same Day  Result Date: 12/07/2017 CLINICAL DATA:  Hypoxia. EXAM: PORTABLE CHEST 1 VIEW COMPARISON:  11/17/2017 FINDINGS: The cardiac silhouette remains prominently enlarged. Aortic atherosclerosis is noted. The lungs remain mildly hypoinflated with minimal atelectasis in both lung bases, similar to the prior study. No edema, sizable pleural effusion, or pneumothorax is identified. IMPRESSION: Unchanged cardiomegaly and minimal bibasilar atelectasis.  Electronically Signed   By: Logan Bores M.D.   On: 12/07/2017 14:25   Dg Abd Portable 1v  Result Date: 12/10/2017 CLINICAL DATA:  Orogastric tube placement. EXAM: PORTABLE ABDOMEN - 1 VIEW COMPARISON:  Abdomen and pelvis CT dated 12/07/2017. FINDINGS: Interval orogastric tube with its tip in the region of the distal gastric antrum and side hole in the mid stomach. Paucity of intestinal gas with no dilated bowel loops seen. Interval right mid abdominal pigtail catheter and left lower lobe airspace opacity. Thoracic and lower cervical spine degenerative changes. IMPRESSION: 1. Orogastric tube tip in the distal stomach. 2. Interval left lower lobe atelectasis or pneumonia. Electronically Signed   By: Claudie Revering M.D.   On: 12/10/2017 19:36   US Abdomen Limited Ruq  Result Date: 12/07/2017 CLINICAL DATA:  Elevated liver functions with elevated bilirubin, alkaline phosphatase, and lactic acid. History of hypertension. EXAM: ULTRASOUND ABDOMEN LIMITED RIGHT UPPER QUADRANT COMPARISON:  CT abdomen and pelvis 12/07/2017 FINDINGS: Gallbladder: Cholelithiasis with small stones measuring up to 6 mm diameter. Layering sludge in the gallbladder. There is diffuse gallbladder wall thickness and pericholecystic edema. Murphy's sign is negative. Common bile duct: Diameter: 4.8 mm, normal Liver: No focal lesion identified. Within normal limits in parenchymal echogenicity. Portal vein is patent on color Doppler imaging. During real-time imaging, there is evidence of reversal of flow direction with to and fro flow identified. Small amount of free fluid in the upper abdomen, likely ascites. IMPRESSION: 1. Cholelithiasis with gallbladder sludge, gallbladder wall thickening, and pericholecystic edema. Negative Murphy's sign. Changes are nonspecific and could be due to cholecystitis, liver disease, ascites, or heart failure. 2. Intermittent reversal of flow demonstrated in the portal veins. This may be due to liver disease or  cirrhosis. 3. Mild ascites. Electronically Signed  By: Lucienne Capers M.D.   On: 12/07/2017 23:23    Labs:  CBC: Recent Labs    12/07/17 0928 12/07/17 1733 12/07/17 2057 12/08/17 0558 12/08/17 1500 12/09/17 0407  WBC 13.1* 12.3*  --  11.7*  --  6.5  HGB 11.2* 10.0*  --  9.3*  --  8.9*  HCT 40.0 35.0* 33.0* 31.8*  --  28.8*  PLT 150 166  --  125* 111* 93*    COAGS: Recent Labs    12/08/17 0920  12/08/17 1500 12/08/17 1700 12/09/17 0407 12/09/17 1115 12/11/17 0523  INR 7.71*   < > 7.37* 5.79* 4.38* 3.59 2.48  APTT 81*  --  86*  --  52*  --   --    < > = values in this interval not displayed.    BMP: Recent Labs    12/08/17 1340 12/09/17 0407 12/10/17 0553 12/11/17 0523  NA 139 143 150* 154*  K 3.4* 2.9* 3.0* 2.8*  CL 101 100* 102 109  CO2 26 30 35* 34*  GLUCOSE 105* 94 100* 126*  BUN 60* 53* 41* 32*  CALCIUM 8.6* 8.4* 8.7* 8.5*  CREATININE 3.15* 2.39* 1.90* 1.58*  GFRNONAA 17* 24* 32* 40*  GFRAA 20* 28* 37* 47*    LIVER FUNCTION TESTS: Recent Labs    12/09/17 1743 12/10/17 0553 12/10/17 1733 12/11/17 0523  BILITOT 8.8* 9.5* 11.3* 11.9*  AST 740* 616* 531* 403*  ALT 620* 551* 518* 438*  ALKPHOS 59 49 49 47  PROT 6.3* 6.3* 7.1 5.6*  ALBUMIN 2.9* 2.8* 2.6* 2.4*    Assessment and Plan:  Cholecystitis Perc chole drain in IR 6/24 TB rising Now intubated Drain to stay for 6-8 weeks-- plan per CCM  Electronically Signed: Johnisha Louks A, PA-C 12/11/2017, 9:37 AM   I spent a total of 15 Minutes at the the patient's bedside AND on the patient's hospital floor or unit, greater than 50% of which was counseling/coordinating care for perc chole drain

## 2017-12-11 NOTE — Consult Note (Signed)
Referring Provider:  Dr. Jamey Reas  Primary Care Physician:  Cyndi Bender, PA-C Primary Gastroenterologist:  Dr. Alessandra Bevels  Reason for Consultation:  Abnormal LFTs  HPI: Jacob Shannon is a 77 y.o. male with past medical history of atrial fibrillation on Pradaxe, History of chronic diastolic heart failure, history of coronary artery disease visit to the hospital on 12/07/2017 with lethargy, weakness and poor appetite.he was found to have acute kidney injury . Patient had worsening altered mental status over next 24-48 hours. He was also found to have abnormal LFTs. AST was 1269 and ALTs 740 on 12/08/2017. Alkaline phosphatase is normal and T bili was 7.1.CT abdomen pelvis without contrast showed possible gallbladder wall thickening. Subsequent ultrasound showed gallbladder wall thickening and pericholecystic edema. HIDA scan - nonvisualization of the gallbladder.underwent IR guided percutaneous cholecystostomy tube placement on 12/09/2017. GI is consulted because his LFTs continues to remain elevated.he was also found to have elevated INR more than 10 which was reversed with vitamin K/ Praxbind. He was intubated yesterday for airway protection.  Patient seen and examined at bedside. Family currently at bedside. He is currently intubated and agitated. Unable to up in any history from the patient. According to family, he drinks 1-2 beers on a daily basis. No known liver disease. He denied any evidence of bleeding prior to hospitalization.  Colonoscopy by me in October 2018 showed 4 small adenomatous polyps.  Past Medical History:  Diagnosis Date  . Chronic atrial fibrillation ([place])   . Complication of anesthesia    pt states that  "i'm hard to wake up"  . Diastolic heart failure ([place])   . Essential hypertension   . Gout   . Hypercholesterolemia   . Leukocytoclastic vasculitis ([place])   . Mitral regurgitation    Moderate August 2016  . Secondary pulmonary hypertension    RVSP 49 mmHg August  2016  . Spinal stenosis   . 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 CARDIAC CATHETERIZATION N/A 08/26/2015   Procedure: Right/Left Heart Cath and Coronary Angiography;  Surgeon: Peter M Martinique, MD;  Location: [facility];  Service: Cardiovascular;  Laterality: N/A;  . CATARACT EXTRACTION Bilateral   . COLONOSCOPY    . IR PERC CHOLECYSTOSTOMY  12/09/2017  . LEG SKIN LESION  BIOPSY / EXCISION Right 2010  . TEE WITHOUT CARDIOVERSION N/A 08/25/2015   Procedure: TRANSESOPHAGEAL ECHOCARDIOGRAM (TEE);  Surgeon: Sanda Klein, MD;  Location: [facility] ENDOSCOPY;  Service: Cardiovascular;  Laterality: N/A;    Prior to Admission medications   Medication Sig Start Date End Date Taking? Authorizing Provider  colchicine 0.6 MG tablet Take 0.6 mg by mouth 2 (two) times daily.    Yes [provider]  dabigatran (PRADAXA) 150 MG CAPS capsule Take 150 mg by mouth 2 (two) times daily.   Yes [provider]  docusate sodium (COLACE) 100 MG capsule Take 100 mg by mouth daily.   Yes [provider]  DULoxetine (CYMBALTA) 30 MG capsule Take 30 mg by mouth daily.   Yes [provider]  metoprolol tartrate (LOPRESSOR) 25 MG tablet Take 12.5-25 mg by mouth See admin instructions. Take 12.5 mg by mouth in the morning and 25 mg in the evening   Yes [provider]  potassium chloride SA (K-DUR,KLOR-CON) 20 MEQ tablet Take 20 mEq by mouth 2 (two) times daily.   Yes [provider]  torsemide [facility])  20 MG tablet Take 40-60 mg by mouth See admin instructions. Take 60 mg by mouth in the morning and 40 mg midday for fluid/edema 11/19/16  Yes Martinique, Peter M, MD  metoprolol succinate (TOPROL-XL) 25 MG 24 hr tablet Take 1 tablet (25 mg total) by mouth 3 (three) times daily. PLEASE CONTACT OFFICE FOR ADDITIONAL REFILLS Patient not taking: Reported on 11/23/2017 09/11/16   Martinique, Peter M, MD     Scheduled Meds: . sodium chloride   Intravenous Once  . chlorhexidine gluconate (MEDLINE KIT)  15 mL Mouth Rinse BID  . Chlorhexidine Gluconate Cloth  6 each Topical Daily  . docusate  100 mg Per Tube Daily  . ipratropium-albuterol  3 mL Nebulization Q4H  . ipratropium-albuterol      . lidocaine  1 patch Transdermal Q24H  . mouth rinse  15 mL Mouth Rinse 10 times per day  . sodium chloride flush  10-40 mL Intracatheter Q12H  . sodium chloride flush  5 mL Intracatheter Q8H   Continuous Infusions: . sodium chloride 500 mL (12/11/17 0614)  . dextrose 5 % with kcl 100 mL/hr at 12/11/17 1048  . diltiazem (CARDIZEM) infusion Stopped (12/11/17 0027)  . famotidine (PEPCID) IV 20 mg (12/11/17 1026)  . lactated ringers 50 mL/hr at 12/11/17 0600  . piperacillin-tazobactam (ZOSYN)  IV 3.375 g (12/11/17 0616)  . potassium chloride 10 mEq (12/11/17 1159)  . potassium PHOSPHATE IVPB (in mmol) 30 mmol (12/11/17 1154)  . propofol (DIPRIVAN) infusion Stopped (12/11/17 1100)   PRN Meds:.sodium chloride, LORazepam, metoprolol tartrate, ondansetron **OR** ondansetron (ZOFRAN) IV, sodium chloride flush  Allergies as of 11/23/2017 - Review Complete 11/25/2017  Allergen Reaction Noted  . Tramadol Nausea And Vomiting 11/23/2017    Family History  Problem Relation Age of Onset  . Heart attack Father   . Coronary artery disease Father   . Colon cancer Brother   . Coronary artery disease Brother   . Stomach cancer Brother   . Lung disease Neg Hx   . Rheumatologic disease Neg Hx     Social History   Socioeconomic History  . Marital status: Married    Spouse name: Not on file  . Number of children: 2  . Years of education: Not on file  . Highest education level: Not on file  Occupational History  . Occupation: self employed     Comment: [employer] in  Social Needs  . Financial resource strain: Not on file  . Food insecurity:    Worry: Not on file    Inability: Not on file  .  Transportation needs:    Medical: Not on file    Non-medical: Not on file  Tobacco Use  . Smoking status: Former Smoker    Packs/day: 1.50    Years: 38.00    Pack years: 57.00    Types: Cigarettes    Start date: 06/18/1956    Last attempt to quit: 06/18/1994    Years since quitting: 23.4  . Smokeless tobacco: Never Used  Substance and Sexual Activity  . Alcohol use: Yes    Alcohol/week: 0.0 oz    Comment: Beer on a weekly basis.  . Drug use: No  . Sexual activity: Not on file  Lifestyle  . Physical activity:    Days per week: Not on file    Minutes per session: Not on file  . Stress: Not on file  Relationships  . Social connections:    Talks on phone: Not on file  Gets together: Not on file    Attends religious service: Not on file    Active member of club or organization: Not on file    Attends meetings of clubs or organizations: Not on file    Relationship status: Not on file  . Intimate partner violence:    Fear of current or ex partner: Not on file    Emotionally abused: Not on file    Physically abused: Not on file    Forced sexual activity: Not on file  Other Topics Concern  . Not on file  Social History Narrative   Originally from [place]. Always lived in [place]. Prior travel to [facility]. 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 No pets currently. No bird, asbestos, mold, or hot tub exposure. He enjoys fishing.     Review of Systems: not able to obtain.  Physical Exam: Vital signs: Vitals:   12/11/17 1223 12/11/17 1225  BP:  109/61  Pulse:  (!) 102  Resp:  14  Temp:    SpO2: 100% 100%   Last BM Date: 12/08/17 General:   Intubated, agitated. Lungs:  Coarse breath sounds bilaterally. Heart:  Irregular rate and rhythm with tachycardia. Abdomen: mild distended, not able to appreciate tenderness, bowel sounds present. Cholecystectomy tube over  right upper quadrant  noted. LE : 1 + edema.  Rectal:  Deferred  GI:  Lab Results: Recent Labs    12/08/17 1500 12/09/17 0407 12/11/17 0930  WBC  --  6.5 6.3  HGB  --  8.9* 8.8*  HCT  --  28.8* 30.6*  PLT 111* 93* 64*   BMET Recent Labs    12/09/17 0407 12/10/17 0553 12/11/17 0523  NA 143 150* 154*  K 2.9* 3.0* 2.8*  CL 100* 102 109  CO2 30 35* 34*  GLUCOSE 94 100* 126*  BUN 53* 41* 32*  CREATININE 2.39* 1.90* 1.58*  CALCIUM 8.4* 8.7* 8.5*   LFT Recent Labs    12/11/17 0523  PROT 5.6*  ALBUMIN 2.4*  AST 403*  ALT 438*  ALKPHOS 47  BILITOT 11.9*  BILIDIR 6.1*  IBILI 5.8*   PT/INR Recent Labs    12/09/17 1115 12/11/17 0523  LABPROT 35.6* 26.7*  INR 3.59 2.48     Studies/Results: Ir Perc Cholecystostomy  Result Date: 12/09/2017 INDICATION: 77 year old male with liver function, multiorgan dysfunction, and possible cholecystitis. The patient has had elevated INR, and referred for percutaneous cholecystostomy. Given the patient's ICU status and possibility of acute cholecystitis contributing, drainage of gallbladder is reasonable. EXAM: CHOLECYSTOSTOMY MEDICATIONS: In house antibiotics ANESTHESIA/SEDATION: Moderate (conscious) sedation was employed during this procedure. A total of Versed 0.5 mg and Fentanyl 50 mcg was administered intravenously. Moderate Sedation Time: 13 minutes. The patient's level of consciousness and vital signs were monitored continuously by radiology nursing throughout the procedure under my direct supervision. FLUOROSCOPY TIME:  Fluoroscopy Time: 0 minutes 36 seconds (4 mGy). COMPLICATIONS: None PROCEDURE: Informed written consent was obtained from the patient and the patient's family after a thorough discussion of the procedural risks, benefits and alternatives. All questions were addressed. Maximal Sterile Barrier Technique was utilized including caps, mask, sterile gowns, sterile gloves, sterile drape, hand hygiene and skin antiseptic. A timeout was performed  prior to the initiation of the procedure. Ultrasound survey of the right upper quadrant was performed for planning purposes. Once the patient is prepped and draped in  the usual sterile fashion, the skin and subcutaneous tissues overlying the gallbladder were generously infiltrated 1% lidocaine for local anesthesia. A coaxial needle was advanced under ultrasound guidance through the skin subcutaneous tissues and a small segment of liver into the gallbladder lumen. With removal of the stylet, spontaneous dark bile drainage occurred. Using modified Seldinger technique, a 10 French drain was placed into the gallbladder fossa, with aspiration of the sample for the lab. Contrast injection confirmed position of the tube within the gallbladder lumen. Drainage catheter was attached to gravity drain with a suture retention placed. Patient tolerated the procedure well and remained hemodynamically stable throughout. No complications were encountered and no significant blood loss encountered. IMPRESSION: Status post percutaneous cholecystostomy. Signed, Dulcy Fanny. Dellia Nims, RPVI [facility] Radiology Electronically Signed   By: Corrie Mckusick D.O.   On: 12/09/2017 14:01   Dg Chest Port 1 View  Result Date: 12/11/2017 CLINICAL DATA:  Respiratory failure EXAM: PORTABLE CHEST 1 VIEW COMPARISON:  12/10/2017 FINDINGS: Cardiac shadow remains enlarged. Endotracheal tube, nasogastric catheter and right jugular central line are again seen and stable. Bilateral basilar atelectasis is noted and stable. Small right pleural effusion is again seen. No bony abnormality is noted. IMPRESSION: Bibasilar atelectasis and small right pleural effusion. Electronically Signed   By: Inez Catalina M.D.   On: 12/11/2017 08:33   Dg Chest Port 1 View  Result Date: 12/10/2017 CLINICAL DATA:  Intubated EXAM: PORTABLE CHEST 1 VIEW COMPARISON:  12/07/2017 FINDINGS: Endotracheal tube well positioned in the  mid trachea 5.2 cm above the carina. NG tube extends below the hemidiaphragms with the tip not visualized. Right IJ central line tip distal SVC. Stable cardiomegaly slight worsening basilar atelectasis. Negative for edema or significant pneumonia. Trace pleural effusions. Aorta is atherosclerotic. Degenerative changes of the spine and shoulders. IMPRESSION: Endotracheal tube 5.2 cm above the carina. Cardiomegaly with slight worsening basilar atelectasis and trace pleural effusions. Electronically Signed   By: Jerilynn Mages.  Shick M.D.   On: 12/10/2017 18:44   Dg Abd Portable 1v  Result Date: 12/10/2017 CLINICAL DATA:  Orogastric tube placement. EXAM: PORTABLE ABDOMEN - 1 VIEW COMPARISON:  Abdomen and pelvis CT dated 12/07/2017. FINDINGS: Interval orogastric tube with its tip in the region of the distal gastric antrum and side hole in the mid stomach. Paucity of intestinal gas with no dilated bowel loops seen. Interval right mid abdominal pigtail catheter and left lower lobe airspace opacity. Thoracic and lower cervical spine degenerative changes. IMPRESSION: 1. Orogastric tube tip in the distal stomach. 2. Interval left lower lobe atelectasis or pneumonia. Electronically Signed   By: Claudie Revering M.D.   On: 12/10/2017 19:36    Impression/Plan: - abnormal LFTs . AST was 1269 and ALTs 740 on 12/08/2017. Alkaline phosphatase is normal and T bili was 7.1. His AST and ALTs are trending down but total bilirubin is trending up. Finding could be from acute hepatitis, most likely ischemic in nature given history of atrial fibrillation and history of diastolic heart failure.Acute hepatitis panel negative on admission.acetaminophen level less than 10.   - Images concerning for acute cholecystitis with HIDA scan showing nonvisualization of the gallbladder. Status post percutaneous cholecystostomy tube placement. - coagulopathy. Improving. INR was more than 10 on 12/07/2017.  S/P vitamin K/ Praxbind. - history of atrial  fibrillation. Was on Pradaxa  - History of CHF. - encephalopathic. Intubated for airway protection.  Recommendations --------------------------- - His abnormal LFTs most likely from acute hepatitis from ischemia/ischemic hepatitis.  pattern of AST and ALT  trending down and total bilirubin trending up has been seen with ischemic hepatitis. - it is difficult to determine that his coagulopathy was due to acute ischemic liver injury or from Pradaxa.  - his encephalopathy is maybe multifactorial but I think is reasonable to start lactulose - recheck LFTs and INR in the morning. - GI will follow.    LOS: 5 days   Otis Brace  MD, FACP 12/11/2017, 12:52 PM  Contact #  [phone]

## 2017-12-11 NOTE — Social Work (Signed)
CSW following.  Pt intubated and on vent at 40%.  CSW will continue to follow for disposition, not medically ready.  Elissa Hefty, LCSW Clinical Social Worker [phone]

## 2017-12-11 NOTE — Progress Notes (Signed)
PULMONARY / CRITICAL CARE MEDICINE   Name: Jacob Shannon MRN: [id number] DOB: 09-24-1940    ADMISSION DATE:  11/23/2017 CONSULTATION DATE:  12/07/2017  REFERRING MD:  [facility] Dr. Myna Hidalgo  CHIEF COMPLAINT:  Hypoxic/lethargic   Brief HPI:   Jacob Shannon is a 77 y.o. male with new onset altered mental status who was admitted with A-fib RVR and required diltiazem infusion to control. Over the last 24-48hrs has become more lethargic and also complaining of gastric discomfort and pain. Laboratory assessment has been an issue as he has vasculitis and poor access. Additionally, the patient has a worsening renal function with metabolic acidosis on recent ABG.  6/26 The patient got prregressively more obtunded and required intubation yesterday afternoon. He is intubated and on propofl for sedation.   SUBJECTIVE:  Status post interventional radiology place and drain.  Remains lethargic.  Ammonia levels noted to be 50.  VITAL SIGNS: BP 109/63   Pulse 78   Temp (!) 96.9 F (36.1 C) (Axillary)   Resp 12   Ht '5\' 8"'$  (1.727 m)   Wt 192 lb 10.9 oz (87.4 kg)   SpO2 98%   BMI 29.30 kg/m   HEMODYNAMICS: CVP:  [16 mmHg-19 mmHg] 16 mmHg  INTAKE / OUTPUT: I/O last 3 completed shifts: In: 2093.3 [I.V.:1381; IV Piggyback:712.3] Out: 2951 [Urine:2940; Drains:105]  PHYSICAL EXAMINATION: General: Frail elderly male poorly responsive. HEENT: Oral mucosa is dry, no JVD lymphadenopathy is appreciated.  Pupils equal reactive asand 55m. He is now intubated Neuro: Not arousable, not moving CV: Heart sounds are regular atrial fibrillation with controlled ventricular rate of 93 PULM: Decreased breath sounds at bases, periods of apnea, respiratory his respirations at times. GI: Tender to touch, right percutaneous drain right outer quadrant noted Extremities: warm/dry, positive edema, bilateral bunions are noted Skin: no rashes or lesions   LABS:  BMET Recent Labs  Lab 12/09/17 0407 12/10/17 0553  12/11/17 0523  NA 143 150* 154*  K 2.9* 3.0* 2.8*  CL 100* 102 109  CO2 30 35* 34*  BUN 53* 41* 32*  CREATININE 2.39* 1.90* 1.58*  GLUCOSE 94 100* 126*    Electrolytes Recent Labs  Lab 12/07/17 1733  12/09/17 0407 12/10/17 0553 12/11/17 0523  CALCIUM 9.2   < > 8.4* 8.7* 8.5*  MG 2.5*  --   --   --  2.2  PHOS 8.4*  --   --   --  1.5*   < > = values in this interval not displayed.    CBC Recent Labs  Lab 12/07/17 1733 12/07/17 2057 12/08/17 0558 12/08/17 1500 12/09/17 0407  WBC 12.3*  --  11.7*  --  6.5  HGB 10.0*  --  9.3*  --  8.9*  HCT 35.0* 33.0* 31.8*  --  28.8*  PLT 166  --  125* 111* 93*    Coag's Recent Labs  Lab 12/08/17 0920  12/08/17 1500  12/09/17 0407 12/09/17 1115 12/11/17 0523  APTT 81*  --  86*  --  52*  --   --   INR 7.71*   < > 7.37*   < > 4.38* 3.59 2.48   < > = values in this interval not displayed.    Sepsis Markers Recent Labs  Lab 12/07/17 1733  12/08/17 0000 12/08/17 0558 12/08/17 1728 12/09/17 0407  LATICACIDVEN 11.7*   < > 9.9*  --  2.5* 2.1*  PROCALCITON 2.63  --   --  3.11  --  2.11   < > =  values in this interval not displayed.    ABG Recent Labs  Lab 12/08/17 1737 12/10/17 1055 12/10/17 1946  PHART 7.488* 7.513* 7.587*  PCO2ART 35.1 44.1 40.9  PO2ART 103.0 85.0 443.0*    Liver Enzymes Recent Labs  Lab 12/10/17 0553 12/10/17 1733 12/11/17 0523  AST 616* 531* 403*  ALT 551* 518* 438*  ALKPHOS 49 49 47  BILITOT 9.5* 11.3* 11.9*  ALBUMIN 2.8* 2.6* 2.4*    Cardiac Enzymes Recent Labs  Lab 12/04/2017 2150 12/07/17 1733 12/08/17 1700  TROPONINI 0.05* 0.18* 0.12*    Glucose Recent Labs  Lab 12/10/17 1616 12/10/17 2010 12/10/17 2326 12/11/17 0343 12/11/17 0454 12/11/17 0826  GLUCAP 97 87 88 67* 119* 90    Imaging Dg Chest Port 1 View  Result Date: 12/11/2017 CLINICAL DATA:  Respiratory failure EXAM: PORTABLE CHEST 1 VIEW COMPARISON:  12/10/2017 FINDINGS: Cardiac shadow remains enlarged.  Endotracheal tube, nasogastric catheter and right jugular central line are again seen and stable. Bilateral basilar atelectasis is noted and stable. Small right pleural effusion is again seen. No bony abnormality is noted. IMPRESSION: Bibasilar atelectasis and small right pleural effusion. Electronically Signed   By: Inez Catalina M.D.   On: 12/11/2017 08:33   Dg Chest Port 1 View  Result Date: 12/10/2017 CLINICAL DATA:  Intubated EXAM: PORTABLE CHEST 1 VIEW COMPARISON:  12/07/2017 FINDINGS: Endotracheal tube well positioned in the mid trachea 5.2 cm above the carina. NG tube extends below the hemidiaphragms with the tip not visualized. Right IJ central line tip distal SVC. Stable cardiomegaly slight worsening basilar atelectasis. Negative for edema or significant pneumonia. Trace pleural effusions. Aorta is atherosclerotic. Degenerative changes of the spine and shoulders. IMPRESSION: Endotracheal tube 5.2 cm above the carina. Cardiomegaly with slight worsening basilar atelectasis and trace pleural effusions. Electronically Signed   By: Jerilynn Mages.  Shick M.D.   On: 12/10/2017 18:44   Dg Abd Portable 1v  Result Date: 12/10/2017 CLINICAL DATA:  Orogastric tube placement. EXAM: PORTABLE ABDOMEN - 1 VIEW COMPARISON:  Abdomen and pelvis CT dated 12/07/2017. FINDINGS: Interval orogastric tube with its tip in the region of the distal gastric antrum and side hole in the mid stomach. Paucity of intestinal gas with no dilated bowel loops seen. Interval right mid abdominal pigtail catheter and left lower lobe airspace opacity. Thoracic and lower cervical spine degenerative changes. IMPRESSION: 1. Orogastric tube tip in the distal stomach. 2. Interval left lower lobe atelectasis or pneumonia. Electronically Signed   By: Claudie Revering M.D.   On: 12/10/2017 19:36     STUDIES:  CTH 6/21>>NAICA, old infarct CT A/P 6/22>>Mild ascites with cholelithiasis and possible sludge  HIDA 6/23>>1. Nonvisualization of the gallbladder and  small bowel with no evidence of radiotracer within the common bile duct. Persistent activity noted throughout the liver. Findings support hepatocellular dysfunction, with depressed hepatic excretion of bile. 2. Acute cholecystitis is not excluded.   CULTURES: BCx 6/22>>NGTD 12/09/2017 percutaneous drainage fluid>>  ANTIBIOTICS: Vancomycin 6/21>>6/22 Zosyn 6/21>>>  SIGNIFICANT EVENTS: -INR elevated >10 and treated with Vit K/Praxbind. Repeat 3>>>7 -LFTs continue to worsen  -IR placed right Foley drain 12/09/2017  LINES/TUBES: CVC RIJ 6/22>>  DISCUSSION: Jacob Shannon is a 77 y.o. male with acute hepatic failure with unclear etiology and associated AKI who initially presented with A-fib with RVR. Significantly coagulopathic that was reversed with Vit K and Praxbind. Continues to have worsening LFTs and underwent CT of A/P that revealed possible cholecystitis that was then confirmed with HIDA scan. IR on  board and may take down for percutaneous drainage once INR improves. Receiving FFP and reaching out to Hepatology for further assistance in workup/managment. Hemodynamically remains stable with MAP 80-90 and only on minimal supplemental oxygen.  12/09/2017 are placed percutaneous drain on the right.  His mental status continues to be lethargic his ammonia level is 50.  Question component of obstructive sleep apnea  ASSESSMENT / PLAN:  PULMONARY A: Minimally hypoxic  Suspected underlying OSA Patient intubated yesterday in the afternoon for worsening obtundation. He had thick sectretions in the upper airway. CXR suggest bibasilar atelectasis vs. Pneumonia in the lower lobes   CARDIOVASCULAR A:  Afib with RVR, resolved  dCHF with elevated CVP throughout the day  SIRS  6/26 His heart rate is controlled. He is not requiring pressors.   RENAL Lab Results  Component Value Date   CREATININE 1.58 (H) 12/11/2017   CREATININE 1.90 (H) 12/10/2017   CREATININE 2.39 (H) 12/09/2017    CREATININE 0.99 09/09/2015   Recent Labs  Lab 12/09/17 0407 12/10/17 0553 12/11/17 0523  K 2.9* 3.0* 2.8*    A:   AKI with metabolic acidosis - improving  Hypokalemia and hypophosphatemia. His electrolytes are beingrepalaced presently 6/26 His renal fn. Appears to be improving and his last creatinine is 1.58. His output was 2200 yesterday. His sodium has climbed so will hoild diuretics and have started dextrose infusion.   GASTROINTESTINAL A:   Acute liver failure, unclear etiology  Cholecystitis  P:   Percutaneous drain placed on 12/09/2017. His bili continues toi climb daily. Alk phos stable. I am going to ask Gi  To weigh in on his confdition. I would like to send him for another CT scan but would prefer to do do so when his creatinine has improved some.   HEMATOLOGIC Lab Results  Component Value Date   INR 2.48 12/11/2017   INR 3.59 12/09/2017   INR 4.38 (HH) 12/09/2017    A:   Coagulopathy  P:  Status post FFP infusion for IR to place drain Status post vitamin K infusion Continue to monitor INR   INFECTIOUS A:   Cholecystitis  P:   Percutaneous drain placed per IR on 12/09/2017 Continue n.p.o. Continue antimicrobial therapy Monitor culture data, white blood cell count, temperature curve  ENDOCRINE CBG (last 3)  Recent Labs    12/11/17 0343 12/11/17 0454 12/11/17 0826  GLUCAP 67* 119* 90    A:   Hypoglycemia, resolved    P:   Monitor for sliding scale insulin protocol. Had one brief episode of hypoglycemia yesterday.  NEUROLOGIC A:   AMS P:   Elevated ammonia on 12/09/2017  Ammonia is only minimally elevated and does not likely explain this change in mental status. May need repeat CT scan of head. Will ask neurology to see the patient. I am check ing TSH, free T4   App cct 30 min  Jacob Likens MD Jacob Shannon PCCM Pager [phone] till 1 pm If no answer page [phone] 12/11/2017, 9:22 AM

## 2017-12-12 ENCOUNTER — Inpatient Hospital Stay (HOSPITAL_COMMUNITY): Payer: Medicare Other

## 2017-12-12 DIAGNOSIS — E440 Moderate protein-calorie malnutrition: Secondary | ICD-10-CM

## 2017-12-12 DIAGNOSIS — L8990 Pressure ulcer of unspecified site, unspecified stage: Secondary | ICD-10-CM

## 2017-12-12 DIAGNOSIS — K819 Cholecystitis, unspecified: Secondary | ICD-10-CM

## 2017-12-12 LAB — BASIC METABOLIC PANEL
ANION GAP: 7 (ref 5–15)
ANION GAP: 8 (ref 5–15)
BUN: 27 mg/dL — ABNORMAL HIGH (ref 8–23)
BUN: 30 mg/dL — ABNORMAL HIGH (ref 8–23)
CALCIUM: 7.9 mg/dL — AB (ref 8.9–10.3)
CHLORIDE: 114 mmol/L — AB (ref 98–111)
CO2: 31 mmol/L (ref 22–32)
CO2: 28 mmol/L (ref 22–32)
Calcium: 7.7 mg/dL — ABNORMAL LOW (ref 8.9–10.3)
Chloride: 113 mmol/L — ABNORMAL HIGH (ref 98–111)
Creatinine, Ser: 1.26 mg/dL — ABNORMAL HIGH (ref 0.61–1.24)
Creatinine, Ser: 1.33 mg/dL — ABNORMAL HIGH (ref 0.61–1.24)
GFR calc Af Amer: >60 mL/min (ref >60)
GFR calc non Af Amer: 53 mL/min — ABNORMAL LOW (ref >60)
GFR calc non Af Amer: 50 mL/min — ABNORMAL LOW (ref >60)
GFR, EST AFRICAN AMERICAN: 57 mL/min — AB (ref >60)
Glucose, Bld: 156 mg/dL — ABNORMAL HIGH (ref 70–99)
Glucose, Bld: 176 mg/dL — ABNORMAL HIGH (ref 70–99)
POTASSIUM: 3.2 mmol/L — AB (ref 3.5–5.1)
POTASSIUM: 3.6 mmol/L (ref 3.5–5.1)
Sodium: 152 mmol/L — ABNORMAL HIGH (ref 135–145)
Sodium: 149 mmol/L — ABNORMAL HIGH (ref 135–145)

## 2017-12-12 LAB — BLOOD GAS, ARTERIAL
Acid-Base Excess: 6.3 mmol/L — ABNORMAL HIGH (ref 0.0–2.0)
Bicarbonate: 30.1 mmol/L — ABNORMAL HIGH (ref 20.0–28.0)
DRAWN BY: 398991
Delivery systems: POSITIVE
Expiratory PAP: 5
FIO2: 0.3
INSPIRATORY PAP: 12
O2 SAT: 95.7 %
PO2 ART: 94.9 mmHg (ref 83.0–108.0)
Patient temperature: 98.9
pCO2 arterial: 42.3 mmHg (ref 32.0–48.0)
pH, Arterial: 7.467 — ABNORMAL HIGH (ref 7.350–7.450)

## 2017-12-12 LAB — CBC WITH DIFFERENTIAL/PLATELET
Abs Immature Granulocytes: 0.1 10*3/uL (ref 0.0–0.1)
Basophils Absolute: 0 10*3/uL (ref 0.0–0.1)
Basophils Relative: 0 %
Eosinophils Absolute: 0.1 10*3/uL (ref 0.0–0.7)
Eosinophils Relative: 2 %
HEMATOCRIT: 32 % — AB (ref 39.0–52.0)
Hemoglobin: 9.2 g/dL — ABNORMAL LOW (ref 13.0–17.0)
IMMATURE GRANULOCYTES: 1 %
LYMPHS ABS: 0.2 10*3/uL — AB (ref 0.7–4.0)
Lymphocytes Relative: 2 %
MCH: 27.5 pg (ref 26.0–34.0)
MCHC: 28.8 g/dL — ABNORMAL LOW (ref 30.0–36.0)
MCV: 95.5 fL (ref 78.0–100.0)
MONO ABS: 0.6 10*3/uL (ref 0.1–1.0)
MONOS PCT: 9 %
NEUTROS PCT: 86 %
Neutro Abs: 6 10*3/uL (ref 1.7–7.7)
Platelets: 63 10*3/uL — ABNORMAL LOW (ref 150–400)
RBC: 3.35 MIL/uL — ABNORMAL LOW (ref 4.22–5.81)
RDW: 20.7 % — AB (ref 11.5–15.5)
WBC: 7.1 10*3/uL (ref 4.0–10.5)

## 2017-12-12 LAB — GLUCOSE, CAPILLARY
GLUCOSE-CAPILLARY: 101 mg/dL — AB (ref 70–99)
GLUCOSE-CAPILLARY: 104 mg/dL — AB (ref 70–99)
GLUCOSE-CAPILLARY: 122 mg/dL — AB (ref 70–99)
GLUCOSE-CAPILLARY: 148 mg/dL — AB (ref 70–99)
Glucose-Capillary: 144 mg/dL — ABNORMAL HIGH (ref 70–99)
Glucose-Capillary: 61 mg/dL — ABNORMAL LOW (ref 70–99)
Glucose-Capillary: 64 mg/dL — ABNORMAL LOW (ref 70–99)
Glucose-Capillary: 133 mg/dL — ABNORMAL HIGH (ref 70–99)

## 2017-12-12 LAB — HEPATIC FUNCTION PANEL
ALBUMIN: 2.1 g/dL — AB (ref 3.5–5.0)
ALBUMIN: 1.9 g/dL — AB (ref 3.5–5.0)
ALK PHOS: 48 U/L (ref 38–126)
ALT: 318 U/L — AB (ref 0–44)
ALT: 245 U/L — ABNORMAL HIGH (ref 0–44)
AST: 211 U/L — AB (ref 15–41)
AST: 130 U/L — AB (ref 15–41)
Alkaline Phosphatase: 49 U/L (ref 38–126)
BILIRUBIN TOTAL: 13.8 mg/dL — AB (ref 0.3–1.2)
Bilirubin, Direct: 7.7 mg/dL — ABNORMAL HIGH (ref 0.0–0.2)
Bilirubin, Direct: 8.1 mg/dL — ABNORMAL HIGH (ref 0.0–0.2)
Indirect Bilirubin: 5.7 mg/dL — ABNORMAL HIGH (ref 0.3–0.9)
Indirect Bilirubin: 5.7 mg/dL — ABNORMAL HIGH (ref 0.3–0.9)
Total Bilirubin: 13.4 mg/dL — ABNORMAL HIGH (ref 0.3–1.2)
Total Protein: 5.4 g/dL — ABNORMAL LOW (ref 6.5–8.1)
Total Protein: 5.5 g/dL — ABNORMAL LOW (ref 6.5–8.1)

## 2017-12-12 LAB — MAGNESIUM
MAGNESIUM: 2 mg/dL (ref 1.7–2.4)
MAGNESIUM: 1.7 mg/dL (ref 1.7–2.4)
Magnesium: 1.5 mg/dL — ABNORMAL LOW (ref 1.7–2.4)

## 2017-12-12 LAB — CULTURE, BLOOD (ROUTINE X 2)
Culture: NO GROWTH
Culture: NO GROWTH
SPECIAL REQUESTS: ADEQUATE
SPECIAL REQUESTS: ADEQUATE

## 2017-12-12 LAB — PHOSPHORUS
PHOSPHORUS: 2.3 mg/dL — AB (ref 2.5–4.6)
PHOSPHORUS: 1.4 mg/dL — AB (ref 2.5–4.6)
Phosphorus: 1.4 mg/dL — ABNORMAL LOW (ref 2.5–4.6)

## 2017-12-12 LAB — PROTIME-INR
INR: 2.07
Prothrombin Time: 23.2 seconds — ABNORMAL HIGH (ref 11.4–15.2)

## 2017-12-12 LAB — LIPASE, BLOOD: LIPASE: 94 U/L — AB (ref 11–51)

## 2017-12-12 LAB — CORTISOL: Cortisol, Plasma: 12.4 ug/dL

## 2017-12-12 LAB — URIC ACID: Uric Acid, Serum: 3.7 mg/dL (ref 3.7–8.6)

## 2017-12-12 LAB — PROCALCITONIN: PROCALCITONIN: 1.27 ng/mL

## 2017-12-12 MED ORDER — IOPAMIDOL (ISOVUE-300) INJECTION 61%
INTRAVENOUS | Status: AC
Start: 2017-12-12 — End: 2017-12-12
  Administered 2017-12-12: 10:00:00
  Filled 2017-12-12: qty 30

## 2017-12-12 MED ORDER — DEXTROSE 50 % IV SOLN
INTRAVENOUS | Status: AC
  Filled 2017-12-12: qty 50

## 2017-12-12 MED ORDER — MAGNESIUM SULFATE 2 GM/50ML IV SOLN
2.0000 g | Freq: Once | INTRAVENOUS | Status: AC
  Administered 2017-12-13: 2 g via INTRAVENOUS
  Filled 2017-12-12: qty 50

## 2017-12-12 MED ORDER — VANCOMYCIN HCL 10 G IV SOLR
2000.0000 mg | Freq: Once | INTRAVENOUS | Status: AC
  Administered 2017-12-12: 2000 mg via INTRAVENOUS
  Filled 2017-12-12: qty 2000

## 2017-12-12 MED ORDER — ACETAMINOPHEN 160 MG/5ML PO SOLN
650.0000 mg | Freq: Four times a day (QID) | ORAL | Status: DC | PRN
  Administered 2017-12-13 – 2017-12-14 (×4): 650 mg via ORAL
  Filled 2017-12-12 (×5): qty 20.3

## 2017-12-12 MED ORDER — IOPAMIDOL (ISOVUE-370) INJECTION 76%
INTRAVENOUS | Status: AC
  Filled 2017-12-12: qty 100

## 2017-12-12 MED ORDER — LACTULOSE 10 GM/15ML PO SOLN
30.0000 g | Freq: Two times a day (BID) | ORAL | Status: DC
  Administered 2017-12-12: 30 g via ORAL
  Filled 2017-12-12 (×2): qty 45

## 2017-12-12 MED ORDER — POTASSIUM CHLORIDE 10 MEQ/50ML IV SOLN
10.0000 meq | INTRAVENOUS | Status: AC
  Administered 2017-12-13 (×2): 10 meq via INTRAVENOUS
  Filled 2017-12-12 (×2): qty 50

## 2017-12-12 MED ORDER — DEXTROSE 50 % IV SOLN
25.0000 mL | Freq: Once | INTRAVENOUS | Status: AC
  Administered 2017-12-12: 25 mL via INTRAVENOUS

## 2017-12-12 MED ORDER — KCL-LACTATED RINGERS 20 MEQ/L IV SOLN
INTRAVENOUS | Status: DC
  Administered 2017-12-12 – 2017-12-13 (×2): via INTRAVENOUS
  Filled 2017-12-12 (×8): qty 1000

## 2017-12-12 MED ORDER — SODIUM CHLORIDE 0.9 % IV BOLUS
500.0000 mL | Freq: Once | INTRAVENOUS | Status: AC
  Administered 2017-12-12: 500 mL via INTRAVENOUS

## 2017-12-12 MED ORDER — IPRATROPIUM-ALBUTEROL 0.5-2.5 (3) MG/3ML IN SOLN
3.0000 mL | Freq: Three times a day (TID) | RESPIRATORY_TRACT | Status: DC
  Administered 2017-12-13 – 2017-12-15 (×7): 3 mL via RESPIRATORY_TRACT
  Filled 2017-12-12 (×8): qty 3

## 2017-12-12 MED ORDER — IBUPROFEN 100 MG/5ML PO SUSP
400.0000 mg | Freq: Four times a day (QID) | ORAL | Status: DC | PRN
  Administered 2017-12-12 – 2017-12-15 (×6): 400 mg via ORAL
  Filled 2017-12-12 (×6): qty 20

## 2017-12-12 MED ORDER — NOREPINEPHRINE 4 MG/250ML-% IV SOLN
0.0000 ug/min | INTRAVENOUS | Status: DC
Start: 2017-12-12 — End: 2017-12-13
  Administered 2017-12-12: 2 ug/min via INTRAVENOUS
  Administered 2017-12-12: 4 ug/min via INTRAVENOUS
  Administered 2017-12-13: 12 ug/min via INTRAVENOUS
  Filled 2017-12-12 (×4): qty 250

## 2017-12-12 MED ORDER — VANCOMYCIN HCL IN DEXTROSE 750-5 MG/150ML-% IV SOLN
750.0000 mg | Freq: Two times a day (BID) | INTRAVENOUS | Status: DC
  Administered 2017-12-12 – 2017-12-15 (×6): 750 mg via INTRAVENOUS
  Filled 2017-12-12 (×6): qty 150

## 2017-12-12 MED ORDER — POTASSIUM CHLORIDE 10 MEQ/100ML IV SOLN
10.0000 meq | INTRAVENOUS | Status: AC
  Administered 2017-12-12 (×4): 10 meq via INTRAVENOUS
  Filled 2017-12-12 (×4): qty 100

## 2017-12-12 MED ORDER — SODIUM PHOSPHATES 45 MMOLE/15ML IV SOLN
30.0000 mmol | Freq: Once | INTRAVENOUS | Status: AC
  Administered 2017-12-13: 30 mmol via INTRAVENOUS
  Filled 2017-12-12: qty 10

## 2017-12-12 MED ORDER — POTASSIUM CHLORIDE 2 MEQ/ML IV SOLN
INTRAVENOUS | Status: DC
  Administered 2017-12-12 – 2017-12-14 (×7): via INTRAVENOUS
  Filled 2017-12-12 (×13): qty 1000

## 2017-12-12 NOTE — Progress Notes (Signed)
[facility] Progress Note  Jacob Shannon 76 y.o. 08-03-1940  CC:  Abnormal LFTs   Subjective: he remains intubated. Discussed with the nursing staff. No acute GI issues.having fever.  ROS :  Not able to obtained.   Objective: Vital signs in last 24 hours: Vitals:   12/12/17 1134 12/12/17 1135  BP: (!) 97/40   Pulse: (!) 110   Resp: 18   Temp:    SpO2: 100% 100%    Physical Exam:  General. Remains intubated. Lungs. Coarse breath sounds bilaterally Abdomen. Mildly distended, not able to appreciate tenderness, bowel sounds present. No peritoneal signs  Lab Results: Recent Labs    12/11/17 0523 12/11/17 1512 12/12/17 0308  NA 154* 153* 152*  K 2.8* 3.4* 3.2*  CL 109 110 114*  CO2 34* 35* 31  GLUCOSE 126* 166* 156*  BUN 32* 27* 27*  CREATININE 1.58* 1.45* 1.26*  CALCIUM 8.5* 8.3* 7.9*  MG 2.2  --  2.0  PHOS 1.5*  --  2.3*   Recent Labs    12/11/17 1721 12/12/17 0308  AST 293* 211*  ALT 369* 318*  ALKPHOS 48 48  BILITOT 13.1* 13.4*  PROT 6.5 5.4*  ALBUMIN 2.3* 2.1*   Recent Labs    12/11/17 0930 12/12/17 0308  WBC 6.3 7.1  NEUTROABS 5.5 6.0  HGB 8.8* 9.2*  HCT 30.6* 32.0*  MCV 95.0 95.5  PLT 64* 63*   Recent Labs    12/11/17 0523 12/12/17 0308  LABPROT 26.7* 23.2*  INR 2.48 2.07      Assessment/Plan: - abnormal LFTs . AST was 1269 and ALTs 740 on 12/08/2017. Alkaline phosphatase is normal and T bili was 7.1. His AST and ALTs are trending down but total bilirubin is trending up. Finding could be from acute hepatitis, most likely ischemic in nature given history of atrial fibrillation and history of diastolic heart failure.Acute hepatitis panel negative on admission.acetaminophen level less than 10. ?? Ischemic hepatitis    - Images concerning for acute cholecystitis with HIDA scan showing nonvisualization of the gallbladder. Status post percutaneous cholecystostomy tube placement. - coagulopathy. Improving. INR was more than 10  on 12/07/2017.  S/P vitamin K/ Praxbind. - history of atrial fibrillation. Was on Pradaxa  - History of CHF. - encephalopathic. Intubated for airway protection.  Recommendations --------------------------- - LFTs  stabilized, hopefully total bilirubin will trend down in next few days.INR trending down. - his encephalopathy  maybe multifactorial . Increase lactulose. - follow CT abdomen/ pelvis  - Consider diagnostic paracentesis if ascites found on CT scan. Patient's abdomen is distended which could be from underlying ascites. - GI will follow.   Otis Brace MD, [place] 12/12/2017, 11:58 AM  Contact #  [phone]

## 2017-12-12 NOTE — Progress Notes (Signed)
Pharmacy Antibiotic Note  Jacob Shannon is a 77 y.o. male admitted on 11/29/2017 with sepsis.  Pharmacy has been consulted for vancomycin dosing. Also on Zosyn day #5 for intra-abdominal infection. Tmax/24h 102.9, WBC wnl. SCr improving to 1.26, CrCl~50.  Plan: Vancomycin 2g IV x 1; then 750mg  IV q12h Continue Zosyn 3.375g IV q8h (4h inf) Monitor clinical progress, c/s, renal function F/u de-escalation plan/LOT, vancomycin trough as indicated   Height: 5\' 8"  (172.7 cm) Weight: 184 lb 1.4 oz (83.5 kg) IBW/kg (Calculated) : 68.4  Temp (24hrs), Avg:100.2 F (37.9 C), Min:97.2 F (36.2 C), Max:102.9 F (39.4 C)  Recent Labs  Lab 12/07/17 1733 12/07/17 2102 12/08/17 0000 12/08/17 0558  12/08/17 1728 12/09/17 0407 12/10/17 0553 12/11/17 0523 12/11/17 0930 12/11/17 1512 12/12/17 0308  WBC 12.3*  --   --  11.7*  --   --  6.5  --   --  6.3  --  7.1  CREATININE 3.21*  --   --   --    < >  --  2.39* 1.90* 1.58*  --  1.45* 1.26*  LATICACIDVEN 11.7* 10.4* 9.9*  --   --  2.5* 2.1*  --   --   --   --   --    < > = values in this interval not displayed.    Estimated Creatinine Clearance: 50.8 mL/min (A) (by C-G formula based on SCr of 1.26 mg/dL (H)).    Allergies  Allergen Reactions  . Tramadol Nausea And Vomiting    Antimicrobials this admission: 6/22 zosyn>> 6/22 vanc x 1; 6/27>>  Dose adjustments this admission:   Microbiology results: 6/22 BCx: ngtd 6/22 UC: neg 6/24 Bile fluid: ngtd Thank you for allowing pharmacy to be a part of this patient's care.  Elicia Lamp P 12/12/2017 8:09 AM

## 2017-12-12 NOTE — Progress Notes (Signed)
Rt transported patient on ventilator from room 4N24 to CT and back to room 8P10 with no complications. Vitals are stable. RT will continue to monitor.

## 2017-12-12 NOTE — Progress Notes (Signed)
eLink Physician-Brief Progress Note Patient Name: Jacob Shannon DOB: [birth year] MRN: [id number]   Date of Service  12/12/2017  HPI/Events of Note  Hypomagnesemia, hypokalemia, hypophosphatemia  eICU Interventions  Electrolyte replacement per Elink protocol for K+. Mg+, and PO4         [name] 12/12/2017, 11:45 PM

## 2017-12-12 NOTE — Progress Notes (Signed)
Patient transported from 4N24 to MRI and back without complication.

## 2017-12-12 NOTE — Progress Notes (Signed)
PULMONARY / CRITICAL CARE MEDICINE   Name: Jacob Shannon MRN: [id number] DOB: 10/13/1940    ADMISSION DATE:  11/18/2017 CONSULTATION DATE:  12/07/2017  REFERRING MD:  [facility] Dr. Myna Hidalgo  CHIEF COMPLAINT:  Hypoxic/lethargic   Brief HPI:   Jacob Shannon is a 77 y.o. male with new onset altered mental status who was admitted with A-fib RVR and required diltiazem infusion to control. Over the last 24-48hrs has become more lethargic and also complaining of gastric discomfort and pain. Laboratory assessment has been an issue as he has vasculitis and poor access. Additionally, the patient has a worsening renal function with metabolic acidosis on recent ABG.  6/26 The patient got prregressively more obtunded and required intubation yesterday afternoon. He is intubated and on propofl for sedation.  6/27 The a patient is a little hypotensive this AM. His BP is running 78-93 systolic. His sodium is up a bit. He had been getting dextrose overnight. He remaisn quite obtunded. His jaundice appears a bit worse a swell. His renal fn ahs improved overnight.  SUBJECTIVE:  Status post interventional radiology place and drain.  Remains lethargic.  Ammonia levels noted to be 50.  VITAL SIGNS: BP (!) 80/42   Pulse (!) 106   Temp (!) 101.6 F (38.7 C) (Axillary)   Resp 18   Ht '5\' 8"'$  (1.727 m)   Wt 184 lb 1.4 oz (83.5 kg)   SpO2 100%   BMI 27.99 kg/m   HEMODYNAMICS:    INTAKE / OUTPUT: I/O last 3 completed shifts: In: 4165.2 [I.V.:3086.1; Other:10; NG/GT:708; IV Piggyback:361] Out: 8101 [Urine:3285; Drains:170; Stool:750]  PHYSICAL EXAMINATION: General: Frail elderly male poorly responsive. Quite jaundiced HEENT: Oral mucosa is dry, no JVD lymphadenopathy is appreciated.  Pupils equal reactive asand 25m. He is now intubated Neuro: Not arousable, not moving CV: Heart sounds are regular atrial fibrillation with controlled ventricular rate of 93 PULM: Decreased breath sounds at bases, periods of  apnea, respiratory his respirations at times. GI: Tender to touch, right percutaneous drain right outer quadrant noted Extremities: warm/dry, positive edema, bilateral bunions are noted Skin: no rashes or lesions   LABS:  BMET Recent Labs  Lab 12/11/17 0523 12/11/17 1512 12/12/17 0308  NA 154* 153* 152*  K 2.8* 3.4* 3.2*  CL 109 110 114*  CO2 34* 35* 31  BUN 32* 27* 27*  CREATININE 1.58* 1.45* 1.26*  GLUCOSE 126* 166* 156*    Electrolytes Recent Labs  Lab 12/07/17 1733  12/11/17 0523 12/11/17 1512 12/12/17 0308  CALCIUM 9.2   < > 8.5* 8.3* 7.9*  MG 2.5*  --  2.2  --  2.0  PHOS 8.4*  --  1.5*  --  2.3*   < > = values in this interval not displayed.    CBC Recent Labs  Lab 12/09/17 0407 12/11/17 0930 12/12/17 0308  WBC 6.5 6.3 7.1  HGB 8.9* 8.8* 9.2*  HCT 28.8* 30.6* 32.0*  PLT 93* 64* 63*    Coag's Recent Labs  Lab 12/08/17 0920  12/08/17 1500  12/09/17 0407 12/09/17 1115 12/11/17 0523 12/12/17 0308  APTT 81*  --  86*  --  52*  --   --   --   INR 7.71*   < > 7.37*   < > 4.38* 3.59 2.48 2.07   < > = values in this interval not displayed.    Sepsis Markers Recent Labs  Lab 12/07/17 1733  12/08/17 0000 12/08/17 0558 12/08/17 1728 12/09/17 0407  LATICACIDVEN 11.7*   < >  9.9*  --  2.5* 2.1*  PROCALCITON 2.63  --   --  3.11  --  2.11   < > = values in this interval not displayed.    ABG Recent Labs  Lab 12/10/17 1946 12/11/17 0929 12/11/17 1250  PHART 7.587* 7.569* 7.519*  PCO2ART 40.9 39.6 42.0  PO2ART 443.0* 147.0* 150.0*    Liver Enzymes Recent Labs  Lab 12/11/17 0523 12/11/17 1721 12/12/17 0308  AST 403* 293* 211*  ALT 438* 369* 318*  ALKPHOS 47 48 48  BILITOT 11.9* 13.1* 13.4*  ALBUMIN 2.4* 2.3* 2.1*    Cardiac Enzymes Recent Labs  Lab 11/29/2017 2150 12/07/17 1733 12/08/17 1700  TROPONINI 0.05* 0.18* 0.12*    Glucose Recent Labs  Lab 12/11/17 1144 12/11/17 1533 12/11/17 1945 12/11/17 2340 12/12/17 0455  12/12/17 0803  GLUCAP 107* 136* 141* 85 104* 144*    Imaging Mr Brain Wo Contrast  Result Date: 12/12/2017 CLINICAL DATA:  77 y/o M; worsening mental status requiring intubation. EXAM: MRI HEAD WITHOUT CONTRAST TECHNIQUE: Multiplanar, multiecho pulse sequences of the brain and surrounding structures were obtained without intravenous contrast. COMPARISON:  11/20/2017 CT head FINDINGS: Brain: Small chronic infarcts are present within the right cerebellar hemisphere, bilateral thalami, and right posterior frontal periventricular white matter. Few nonspecific foci of T2 FLAIR hyperintense signal abnormality in subcortical and periventricular white matter are compatible with chronic microvascular ischemic changes for age. No acute/early subacute infarct, hemorrhage, focal mass effect, extra-axial collection, or herniation. Vascular: Normal flow voids. Skull and upper cervical spine: Normal marrow signal. Sinuses/Orbits: Mild diffuse paranasal sinus mucosal thickening and small right maxillary fluid level, probably due to intubation. Mild increased signal within the mastoid air cells. Bilateral intra-ocular lens replacement. Other: None. IMPRESSION: 1. No acute intracranial abnormality identified. 2. 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 Mild chronic microvascular ischemic changes of white matter. Electronically Signed   By: Kristine Garbe M.D.   On: 12/12/2017 04:37     STUDIES:  CTH 6/21>>NAICA, old infarct CT A/P 6/22>>Mild ascites with cholelithiasis and possible sludge  HIDA 6/23>>1. Nonvisualization of the gallbladder and small bowel with no evidence of radiotracer within the common bile duct. Persistent activity noted throughout the liver. Findings support hepatocellular dysfunction, with depressed hepatic excretion of bile. 2. Acute cholecystitis is not excluded.   CULTURES: BCx  6/22>>NGTD 12/09/2017 percutaneous drainage fluid>>  ANTIBIOTICS: Vancomycin 6/21>>6/22 Zosyn 6/21>>>  SIGNIFICANT EVENTS: -INR elevated >10 and treated with Vit K/Praxbind. Repeat 3>>>7 -LFTs continue to worsen  -IR placed right Foley drain 12/09/2017  LINES/TUBES: CVC RIJ 6/22>>  DISCUSSION: Jacob Shannon is a 77 y.o. male with acute hepatic failure with unclear etiology and associated AKI who initially presented with A-fib with RVR. Significantly coagulopathic that was reversed with Vit K and Praxbind. Continues to have worsening LFTs and underwent CT of A/P that revealed possible cholecystitis that was then confirmed with HIDA scan. IR on board and may take down for percutaneous drainage once INR improves. Receiving FFP and reaching out to Hepatology for further assistance in workup/managment. Hemodynamically remains stable with MAP 80-90 and only on minimal supplemental oxygen.  12/09/2017 are placed percutaneous drain on the right.  His mental status continues to be lethargic his ammonia level is 50.  Question component of obstructive sleep apnea  ASSESSMENT / PLAN:  PULMONARY A: Minimally hypoxic  Suspected underlying OSA Patient intubated yesterday in the afternoon for worsening obtundation. He had thick sectretions  in the upper airway. CXR suggest bibasilar atelectasis vs. Pneumonia in the lower lobes. The patient cannot be actively weaned until his mentation improves. WE are  etting CT chest this AM  CARDIOVASCULAR A:  Afib with RVR, resolved  dCHF with elevated CVP throughout the day  SIRS  6/26 His heart rate is controlled. He is not requiring pressors.   RENAL Lab Results  Component Value Date   CREATININE 1.26 (H) 12/12/2017   CREATININE 1.45 (H) 12/11/2017   CREATININE 1.58 (H) 12/11/2017   CREATININE 0.99 09/09/2015   Recent Labs  Lab 12/11/17 0523 12/11/17 1512 12/12/17 0308  K 2.8* 3.4* 3.2*    A:   AKI with metabolic acidosis - improving   Hypokalemia and hypophosphatemia. His electrolytes are beingrepalaced presently 6/26 His renal fn. Appears to be improving and his last creatinine is 1.58. His output was 2200 yesterday. His sodium has climbed so will hold diuretics and have started dextrose infusion.   GASTROINTESTINAL A:   Acute liver failure, unclear etiology  Cholecystitis  P:   Percutaneous drain placed on 12/09/2017. His bili continues toi climb daily. Alk phos stable. I am going to ask Gi  To weigh in on his confdition. I would like to send him for another CT scan but would prefer to do do so when his creatinine has improved some. I am getting CT abdo and pelvis today. His total bili has been climbing thepast few days despite his recent cholecystostomy. His INR is down to 2 a this time   HEMATOLOGIC Lab Results  Component Value Date   INR 2.07 12/12/2017   INR 2.48 12/11/2017   INR 3.59 12/09/2017    A:   Coagulopathy  P:  Status post FFP infusion for IR to place drain Status post vitamin K infusion Continue to monitor INR   INFECTIOUS A:   Cholecystitis  P:   Percutaneous drain placed per IR on 12/09/2017 Continue n.p.o. Continue antimicrobial therapy Monitor culture data, white blood cell count, temperature curve. The patient continues to run rahter high fevers despite abx and cholecystostomy. We are rescanning hsi abdomen and chest. Blood cultures ordered for next febrille spike.  ENDOCRINE CBG (last 3)  Recent Labs    12/11/17 2340 12/12/17 0455 12/12/17 0803  GLUCAP 85 104* 144*    A:   Hypoglycemia, resolved    P:   Monitor for sliding scale insulin protocol. Had one brief episode of hypoglycemia yesterday.  NEUROLOGIC A:   AMS P:   Elevated ammonia on 12/09/2017  Ammonia is only minimally elevated and does not likely explain this change in mental status. May need repeat CT scan of head. Will ask neurology to see the patient. I am check ing TSH, free T4   App cct 30  min  Micheal Likens MD [facility] Pager [phone] till 1 pm If no answer page [phone] 12/12/2017, 8:13 AM

## 2017-12-12 NOTE — Progress Notes (Signed)
eLink Physician-Brief Progress Note Patient Name: Jacob Shannon DOB: 01/23/1941 MRN: [id number]   Date of Service  12/12/2017  HPI/Events of Note  Afib with RVR, hypokalemia, hypomagnesemia, hypophosphatemia  eICU Interventions  Resume cardizem infusion without a bolus, recheck electrolytes to determine appropriate baseline for replacement        Frederik Pear 12/12/2017, 8:52 PM

## 2017-12-12 NOTE — Progress Notes (Addendum)
Subjective: Slightly more spontaneous movements than I saw yesterday, but no real change  Exam: Vitals:   12/12/17 0600 12/12/17 0700  BP: (!) 90/51 (!) 80/42  Pulse: 98 (!) 106  Resp: 18 18  Temp:    SpO2: 100% 100%   Gen: In bed, intubated Resp: ventilated Abd: soft, nt Skin: Jaundiced  Neuro: MS: Does not open eyes, but does spontaneously move. KG:URKYHC slightly asymmetric, R > L.  Motor: withdraws to nox stim x 4, doe snot localize.  Sensory:as above  Pertinent Labs: T bili-increased to 13.4 from 13.1   Imaging reviewed negative acute MRI  Impression: 77 yo M with acute encephalopathy in the setting of presumed sepsis, acute hepatitis, and AKI. With negative MRI and EEG, I suspect this represents toxic/metabolic encephalopathy. I would expect gradual improvement that lags behind improvement in labs.   Recommendations: 1) Continue treatment of underlying medical problems per CCM 2) Will be available as needed, please call with further questions or concerns.   Roland Rack, MD [facility] [phone]  If 7pm- 7am, please page neurology on call as listed in [place].

## 2017-12-12 NOTE — Consult Note (Signed)
[facility]    Date of Admission:  11/16/2017     Total days of antibiotics                Reason for Consult: Fevers / Cholecystitis    Referring Provider: Debbora Dus Primary Care Provider: Cyndi Bender, PA-C   Assessment/Plan:  Jacob Shannon is a 77 y/o male with previous medical history of atrial fibrillation on Pradaxa, CHF, CAD, and chronic back pain who presented to the ED with lethargy, somnolence, and poor appetite. Experienced worsening mental status requiring intubation for airway protection. Found to have likely cholecystitis of possible ischemic origin but has been having elevated temperatures over the past 2 days with max temperature of 103.3. Percutaneous drain placed by IR continues to drain bile with cultures negative to date and no organisms on gram stain. Hepatitis panel negative. Liver function tests appear to be improving. Initial blood cultures negative with repeat cultures ordered. Currently on Day 5 of Zoysn with vancomycin added today. He does have history of gout with no evidence of acute gout flares.   1. Agree with addition of vancomycin to piperacillin-tazobactam to broaden coverage based on fevers.  2. Continue to monitor cultures to narrow antimicrobial regimen.  3. Will check uric acid levels.  4. If fevers persist will consider addition of fungal coverage.    Principal Problem:   Lethargy Active Problems:   Essential hypertension   Elevated troponin   Chronic diastolic CHF (congestive heart failure), NYHA class 2 (HCC)   Chronic atrial fibrillation (HCC)   Chronic back pain   AKI (acute kidney injury) ([place])   Malnutrition of moderate degree   Pressure injury of skin   . sodium chloride   Intravenous Once  . chlorhexidine gluconate (MEDLINE KIT)  15 mL Mouth Rinse BID  . Chlorhexidine Gluconate Cloth  6 each Topical Daily  . docusate  100 mg Per Tube Daily  . feeding supplement (PRO-STAT SUGAR FREE 64)  60 mL Per  Tube BID  . ipratropium-albuterol  3 mL Nebulization Q4H  . lactulose  30 g Oral BID  . lidocaine  1 patch Transdermal Q24H  . mouth rinse  15 mL Mouth Rinse 10 times per day  . sodium chloride flush  10-40 mL Intracatheter Q12H  . sodium chloride flush  5 mL Intracatheter Q8H     HPI: Jacob Shannon is a 77 y.o. male with previous medical history of atrial fibrillation on Pradaxa, CHF, CAD, and chronic back pain who presented to the ED with lethargy, somnolence, and poor appetite. He had recently been started on Cymbalta and Ultram for chronic low back pain. Afebrile in the ED. CT of the head negative for acute intracranial pathology. Creatinine was elevated at 2.44 with previous baseline of 1.21.  On 6/21 he was found to be progressively obtunded with worsening mental status and arousability with the decision to intubate to protect his airway. Also noted to be having gastric discomfort and pain.   Initial CT of the abdomen with gall bladder wall thickening with concern for possible cholecystitis. Follow up ultrasound with gall bladder sludge and wall thickening and negative Murphy's sign. Hepatocellular dysfunction with depressed hepatic excretion of bile on nuclear medicine imaging. IR placed a percutaneous drain on 6/24.   Gastroenterology following with question of ischemic hepatitis. Liver function tests are trending downward with increase in bilirubin.   Initially developed a fever on 6/26 of 102.9 and today re-current fever with 103.3.  Piperacillin-tazobactam was started on 6/22 and currently on day 5. He was started on Vancomycin today. No leukocytosis currently. Initial blood cultures with no growth and new cultures obtained today. Culture of bile with no growth to date and gram stain with no organisms.    Review of Systems: Review of Systems  Unable to perform ROS: Intubated     Past Medical History:  Diagnosis Date  . Chronic atrial fibrillation ([place])   . Complication of  anesthesia    pt states that  "i'm hard to wake up"  . Diastolic heart failure ([place])   . Essential hypertension   . Gout   . Hypercholesterolemia   . Leukocytoclastic vasculitis ([place])   . Mitral regurgitation    Moderate August 2016  . Secondary pulmonary hypertension    RVSP 49 mmHg August 2016  . Spinal stenosis   . 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 Smoking status: Former Smoker    Packs/day: 1.50    Years: 38.00    Pack years: 57.00    Types: Cigarettes    Start date: 06/18/1956    Last attempt to quit: 06/18/1994    Years since quitting: 23.5  . Smokeless tobacco: Never Used  Substance Use Topics  . Alcohol use: Yes    Alcohol/week: 0.0 oz    Comment: Beer on a weekly basis.  . Drug use: No    Family History  Problem Relation Age of Onset  . Heart attack Father   . Coronary artery disease Father   . Colon cancer Brother   . Coronary artery disease Brother   . Stomach cancer Brother   . Lung disease Neg Hx   . Rheumatologic disease Neg Hx     Allergies  Allergen Reactions  . Tramadol Nausea And Vomiting    OBJECTIVE: Blood pressure (!) 101/57, pulse (!) 110, temperature (!) 103.3 F (39.6 C), temperature source Axillary, resp. rate (!) 28, height '5\' 8"'$  (1.727 m), weight 184 lb 1.4 oz (83.5 kg), SpO2 100 %.  Physical Exam  Constitutional: He appears well-developed and well-nourished. He has a sickly appearance. He appears ill. No distress. He is intubated.  Jaundice appearing  Cardiovascular: Normal rate, normal heart sounds and intact distal pulses. An irregularly irregular rhythm present.  Right IJ central line present. Clean, dry and intact with no evidence of infection.   Pulmonary/Chest: Effort normal and breath sounds normal. No stridor. He is intubated. No respiratory distress. He has no wheezes. He has no rales. He exhibits no tenderness.  Abdominal: Soft. Bowel sounds are normal. He  exhibits no distension and no mass.  Percutaneous drain present and patent. No evidence of infection.   Musculoskeletal:  Gout related changes  Neurological: He is unresponsive.  Skin: Skin is warm and dry.    Lab Results Lab Results  Component Value Date   WBC 7.1 12/12/2017   HGB 9.2 (L) 12/12/2017   HCT 32.0 (L) 12/12/2017   MCV 95.5 12/12/2017   PLT 63 (L) 12/12/2017    Lab Results  Component Value Date   CREATININE 1.26 (H) 12/12/2017   BUN 27 (H) 12/12/2017   NA 152 (H) 12/12/2017   K 3.2 (L) 12/12/2017   CL 114 (H) 12/12/2017   CO2 31 12/12/2017    Lab Results  Component Value Date   ALT 318 (H) 12/12/2017   AST 211 (H) 12/12/2017  ALKPHOS 48 12/12/2017   BILITOT 13.4 (H) 12/12/2017     Microbiology: Recent Results (from the past 240 hour(s))  Culture, Urine     Status: None   Collection Time: 12/07/17 10:25 AM  Result Value Ref Range Status   Specimen Description URINE, RANDOM  Final   Special Requests NONE  Final   Culture   Final    NO GROWTH Performed at [facility], [address] [place], [place] [address]    Report Status 12/09/2017 FINAL  Final  Culture, blood (routine x 2)     Status: None   Collection Time: 12/07/17  3:29 PM  Result Value Ref Range Status   Specimen Description BLOOD RIGHT ANTECUBITAL  Final   Special Requests   Final    BOTTLES DRAWN AEROBIC AND ANAEROBIC Blood Culture adequate volume   Culture   Final    NO GROWTH 5 DAYS Performed at [facility], [place] [address] [place], [place] [address]    Report Status 12/12/2017 FINAL  Final  Culture, blood (Routine X 2) w Reflex to ID Panel     Status: None   Collection Time: 12/07/17  5:30 PM  Result Value Ref Range Status   Specimen Description BLOOD CENTRAL LINE  Final   Special Requests   Final    BOTTLES DRAWN AEROBIC AND ANAEROBIC Blood Culture adequate volume   Culture   Final    NO GROWTH 5 DAYS Performed at [facility], [place] [address]  [place], [place] [address]    Report Status 12/12/2017 FINAL  Final  Culture, body fluid-bottle     Status: None (Preliminary result)   Collection Time: 12/09/17  3:53 PM  Result Value Ref Range Status   Specimen Description BILE  Final   Special Requests NONE  Final   Culture   Final    NO GROWTH 3 DAYS Performed at [facility] [address], [place], [place] [address]    Report Status PENDING  Incomplete  Gram stain     Status: None   Collection Time: 12/09/17  3:53 PM  Result Value Ref Range Status   Specimen Description BILE  Final   Special Requests NONE  Final   Gram Stain   Final    NO WBC SEEN NO ORGANISMS SEEN Performed at [facility], [address], [place], [place] [address]    Report Status 12/09/2017 FINAL  Final  Culture, blood (routine x 2)     Status: None (Preliminary result)   Collection Time: 12/12/17  1:30 PM  Result Value Ref Range Status   Specimen Description BLOOD FEMORAL ARTERY RIGHT  Final   Special Requests   Final    BOTTLES DRAWN AEROBIC AND ANAEROBIC Blood Culture adequate volume   Culture PENDING  Incomplete   Report Status PENDING  Incomplete     Terri Piedra, NP [facility] [phone] Pager  12/12/2017  4:26 PM

## 2017-12-13 ENCOUNTER — Inpatient Hospital Stay (HOSPITAL_COMMUNITY): Payer: Medicare Other

## 2017-12-13 LAB — GLUCOSE, CAPILLARY
GLUCOSE-CAPILLARY: 161 mg/dL — AB (ref 70–99)
GLUCOSE-CAPILLARY: 122 mg/dL — AB (ref 70–99)
GLUCOSE-CAPILLARY: 122 mg/dL — AB (ref 70–99)
GLUCOSE-CAPILLARY: 142 mg/dL — AB (ref 70–99)
Glucose-Capillary: 95 mg/dL (ref 70–99)
Glucose-Capillary: 143 mg/dL — ABNORMAL HIGH (ref 70–99)

## 2017-12-13 LAB — POCT I-STAT 3, ART BLOOD GAS (G3+)
ACID-BASE DEFICIT: 2 mmol/L (ref 0.0–2.0)
Bicarbonate: 21.6 mmol/L (ref 20.0–28.0)
O2 SAT: 99 %
PH ART: 7.423 (ref 7.350–7.450)
PO2 ART: 126 mmHg — AB (ref 83.0–108.0)
Patient temperature: 100.1
TCO2: 23 mmol/L (ref 22–32)
pCO2 arterial: 33.4 mmHg (ref 32.0–48.0)

## 2017-12-13 LAB — HEPATIC FUNCTION PANEL
ALBUMIN: 1.7 g/dL — AB (ref 3.5–5.0)
ALK PHOS: 47 U/L (ref 38–126)
ALT: 185 U/L — ABNORMAL HIGH (ref 0–44)
ALT: 146 U/L — AB (ref 0–44)
AST: 67 U/L — ABNORMAL HIGH (ref 15–41)
AST: 45 U/L — ABNORMAL HIGH (ref 15–41)
Albumin: 1.9 g/dL — ABNORMAL LOW (ref 3.5–5.0)
Alkaline Phosphatase: 49 U/L (ref 38–126)
BILIRUBIN DIRECT: 8.1 mg/dL — AB (ref 0.0–0.2)
BILIRUBIN INDIRECT: 5.2 mg/dL — AB (ref 0.3–0.9)
BILIRUBIN TOTAL: 13.4 mg/dL — AB (ref 0.3–1.2)
Bilirubin, Direct: 7.7 mg/dL — ABNORMAL HIGH (ref 0.0–0.2)
Indirect Bilirubin: 5.3 mg/dL — ABNORMAL HIGH (ref 0.3–0.9)
TOTAL PROTEIN: 5 g/dL — AB (ref 6.5–8.1)
Total Bilirubin: 12.9 mg/dL — ABNORMAL HIGH (ref 0.3–1.2)
Total Protein: 4.9 g/dL — ABNORMAL LOW (ref 6.5–8.1)

## 2017-12-13 LAB — BASIC METABOLIC PANEL
Anion gap: 8 (ref 5–15)
BUN: 28 mg/dL — ABNORMAL HIGH (ref 8–23)
CALCIUM: 7.6 mg/dL — AB (ref 8.9–10.3)
CHLORIDE: 115 mmol/L — AB (ref 98–111)
CO2: 25 mmol/L (ref 22–32)
CREATININE: 1.2 mg/dL (ref 0.61–1.24)
GFR calc non Af Amer: 56 mL/min — ABNORMAL LOW (ref >60)
Glucose, Bld: 195 mg/dL — ABNORMAL HIGH (ref 70–99)
Potassium: 3.5 mmol/L (ref 3.5–5.1)
Sodium: 148 mmol/L — ABNORMAL HIGH (ref 135–145)

## 2017-12-13 LAB — HEMOGLOBIN A1C
HEMOGLOBIN A1C: 6 % — AB (ref 4.8–5.6)
Mean Plasma Glucose: 125.5 mg/dL

## 2017-12-13 LAB — HEPARIN LEVEL (UNFRACTIONATED)

## 2017-12-13 LAB — MAGNESIUM
MAGNESIUM: 2 mg/dL (ref 1.7–2.4)
Magnesium: 1.9 mg/dL (ref 1.7–2.4)

## 2017-12-13 LAB — CBC WITH DIFFERENTIAL/PLATELET
Basophils Absolute: 0 10*3/uL (ref 0.0–0.1)
Basophils Relative: 0 %
EOS PCT: 1 %
Eosinophils Absolute: 0.1 10*3/uL (ref 0.0–0.7)
HCT: 34.8 % — ABNORMAL LOW (ref 39.0–52.0)
Hemoglobin: 10 g/dL — ABNORMAL LOW (ref 13.0–17.0)
LYMPHS ABS: 0.4 10*3/uL — AB (ref 0.7–4.0)
Lymphocytes Relative: 3 %
MCH: 27.3 pg (ref 26.0–34.0)
MCHC: 28.7 g/dL — ABNORMAL LOW (ref 30.0–36.0)
MCV: 95.1 fL (ref 78.0–100.0)
MONO ABS: 0.4 10*3/uL (ref 0.1–1.0)
Monocytes Relative: 3 %
NEUTROS PCT: 93 %
Neutro Abs: 11 10*3/uL — ABNORMAL HIGH (ref 1.7–7.7)
PLATELETS: 52 10*3/uL — AB (ref 150–400)
RBC: 3.66 MIL/uL — AB (ref 4.22–5.81)
RDW: 21.3 % — AB (ref 11.5–15.5)
WBC Morphology: INCREASED
WBC: 11.9 10*3/uL — AB (ref 4.0–10.5)

## 2017-12-13 LAB — PROTIME-INR
INR: 1.99
PROTHROMBIN TIME: 22.5 s — AB (ref 11.4–15.2)

## 2017-12-13 LAB — TRIGLYCERIDES: TRIGLYCERIDES: 82 mg/dL (ref <150)

## 2017-12-13 LAB — PROCALCITONIN: PROCALCITONIN: 3.46 ng/mL

## 2017-12-13 LAB — AMMONIA: AMMONIA: 62 umol/L — AB (ref 9–35)

## 2017-12-13 LAB — PHOSPHORUS
PHOSPHORUS: 2.9 mg/dL (ref 2.5–4.6)
PHOSPHORUS: 1.6 mg/dL — AB (ref 2.5–4.6)

## 2017-12-13 MED ORDER — SODIUM CHLORIDE 0.9 % IV SOLN
100.0000 mg | INTRAVENOUS | Status: DC
  Administered 2017-12-14: 100 mg via INTRAVENOUS
  Filled 2017-12-13 (×2): qty 100

## 2017-12-13 MED ORDER — MIDAZOLAM HCL 2 MG/2ML IJ SOLN: 1.0000 mg | INTRAMUSCULAR | Status: DC | PRN

## 2017-12-13 MED ORDER — AMIODARONE HCL IN DEXTROSE 360-4.14 MG/200ML-% IV SOLN
60.0000 mg/h | INTRAVENOUS | Status: AC
  Administered 2017-12-13 (×2): 60 mg/h via INTRAVENOUS
  Filled 2017-12-13: qty 200

## 2017-12-13 MED ORDER — DOCUSATE SODIUM 50 MG/5ML PO LIQD
100.0000 mg | Freq: Two times a day (BID) | ORAL | Status: DC
  Administered 2017-12-13 – 2017-12-14 (×2): 100 mg
  Filled 2017-12-13 (×3): qty 10

## 2017-12-13 MED ORDER — EPINEPHRINE PF 1 MG/ML IJ SOLN
0.5000 mg | Freq: Once | INTRAMUSCULAR | Status: AC
Start: 2017-12-13 — End: 2017-12-13
  Administered 2017-12-13: 0.5 mg via INTRAVENOUS

## 2017-12-13 MED ORDER — RIFAXIMIN 550 MG PO TABS: 550.0000 mg | ORAL_TABLET | Freq: Two times a day (BID) | ORAL | Status: DC

## 2017-12-13 MED ORDER — RIFAXIMIN 550 MG PO TABS
550.0000 mg | ORAL_TABLET | Freq: Two times a day (BID) | ORAL | Status: DC
  Administered 2017-12-13 – 2017-12-15 (×5): 550 mg
  Filled 2017-12-13 (×5): qty 1

## 2017-12-13 MED ORDER — METHYLPREDNISOLONE SODIUM SUCC 40 MG IJ SOLR
40.0000 mg | Freq: Two times a day (BID) | INTRAMUSCULAR | Status: DC
  Administered 2017-12-13 – 2017-12-15 (×6): 40 mg via INTRAVENOUS
  Filled 2017-12-13 (×5): qty 1

## 2017-12-13 MED ORDER — AMIODARONE IV BOLUS ONLY 150 MG/100ML
150.0000 mg | Freq: Once | INTRAVENOUS | Status: AC
Start: 2017-12-13 — End: 2017-12-13
  Administered 2017-12-13: 150 mg via INTRAVENOUS
  Filled 2017-12-13 (×2): qty 100

## 2017-12-13 MED ORDER — FENTANYL BOLUS VIA INFUSION
25.0000 ug | INTRAVENOUS | Status: DC | PRN
  Filled 2017-12-13: qty 25

## 2017-12-13 MED ORDER — AMIODARONE HCL IN DEXTROSE 360-4.14 MG/200ML-% IV SOLN
30.0000 mg/h | INTRAVENOUS | Status: DC
  Administered 2017-12-13 – 2017-12-15 (×5): 30 mg/h via INTRAVENOUS
  Filled 2017-12-13 (×5): qty 200

## 2017-12-13 MED ORDER — ALBUMIN HUMAN 25 % IV SOLN
25.0000 g | Freq: Two times a day (BID) | INTRAVENOUS | Status: DC
  Administered 2017-12-13 – 2017-12-15 (×5): 25 g via INTRAVENOUS
  Filled 2017-12-13: qty 100
  Filled 2017-12-13 (×6): qty 50

## 2017-12-13 MED ORDER — LACTULOSE 10 GM/15ML PO SOLN
10.0000 g | Freq: Two times a day (BID) | ORAL | Status: DC
  Administered 2017-12-13 – 2017-12-15 (×5): 10 g via ORAL
  Filled 2017-12-13 (×3): qty 15

## 2017-12-13 MED ORDER — INSULIN ASPART 100 UNIT/ML ~~LOC~~ SOLN
0.0000 [IU] | SUBCUTANEOUS | Status: DC
  Administered 2017-12-13 (×3): 3 [IU] via SUBCUTANEOUS
  Administered 2017-12-13: 2 [IU] via SUBCUTANEOUS
  Administered 2017-12-14 (×4): 3 [IU] via SUBCUTANEOUS

## 2017-12-13 MED ORDER — HEPARIN (PORCINE) IN NACL 100-0.45 UNIT/ML-% IJ SOLN
1500.0000 [IU]/h | INTRAMUSCULAR | Status: DC
  Administered 2017-12-13: 1300 [IU]/h via INTRAVENOUS
  Administered 2017-12-14 – 2017-12-15 (×2): 1500 [IU]/h via INTRAVENOUS
  Filled 2017-12-13 (×6): qty 250

## 2017-12-13 MED ORDER — SODIUM CHLORIDE 0.9 % IV SOLN
200.0000 mg | Freq: Once | INTRAVENOUS | Status: AC
  Administered 2017-12-13: 200 mg via INTRAVENOUS
  Filled 2017-12-13: qty 200

## 2017-12-13 MED ORDER — FENTANYL 2500MCG IN NS 250ML (10MCG/ML) PREMIX INFUSION
25.0000 ug/h | INTRAVENOUS | Status: DC
  Administered 2017-12-13: 25 ug/h via INTRAVENOUS
  Filled 2017-12-13: qty 250

## 2017-12-13 MED ORDER — SODIUM CHLORIDE 0.9 % IV SOLN
1.0000 g | Freq: Three times a day (TID) | INTRAVENOUS | Status: DC
  Administered 2017-12-13 – 2017-12-15 (×6): 1 g via INTRAVENOUS
  Filled 2017-12-13 (×7): qty 1

## 2017-12-13 MED ORDER — NOREPINEPHRINE 16 MG/250ML-% IV SOLN
0.0000 ug/min | INTRAVENOUS | Status: DC
  Administered 2017-12-13: 14 ug/min via INTRAVENOUS
  Administered 2017-12-13: 18 ug/min via INTRAVENOUS
  Administered 2017-12-14: 12 ug/min via INTRAVENOUS
  Filled 2017-12-13 (×5): qty 250

## 2017-12-13 MED ORDER — DOCUSATE SODIUM 50 MG/5ML PO LIQD: 100.0000 mg | Freq: Two times a day (BID) | ORAL | Status: DC | PRN

## 2017-12-13 MED ORDER — AMIODARONE LOAD VIA INFUSION
150.0000 mg | Freq: Once | INTRAVENOUS | Status: AC
  Administered 2017-12-13: 150 mg via INTRAVENOUS
  Filled 2017-12-13: qty 83.34

## 2017-12-13 MED ORDER — FENTANYL CITRATE (PF) 100 MCG/2ML IJ SOLN
50.0000 ug | Freq: Once | INTRAMUSCULAR | Status: AC
  Administered 2017-12-14: 50 ug via INTRAVENOUS

## 2017-12-13 NOTE — Progress Notes (Signed)
Pharmacy Antibiotic Note  Jacob Shannon is a 77 y.o. male admitted on 11/27/2017 with concern for intra abdominal infection. ID seeing patient; concern for possible drug resistant organism after 7 days of zosyn with continued fevers  Plan: Dc zosyn Meropenem 1 g q8 eraxis 200 mg x 1 100 mg q24h  Height: 5\' 8"  (172.7 cm) Weight: 200 lb 9.9 oz (91 kg) IBW/kg (Calculated) : 68.4  Temp (24hrs), Avg:100 F (37.8 C), Min:99.6 F (37.6 C), Max:100.7 F (38.2 C)  Recent Labs  Lab 12/07/17 1733 12/07/17 2102 12/08/17 0000 12/08/17 0558  12/08/17 1728 12/09/17 0407  12/11/17 0523 12/11/17 0930 12/11/17 1512 12/12/17 0308 12/12/17 2127 12/13/17 0500  WBC 12.3*  --   --  11.7*  --   --  6.5  --   --  6.3  --  7.1  --  11.9*  CREATININE 3.21*  --   --   --    < >  --  2.39*   < > 1.58*  --  1.45* 1.26* 1.33* 1.20  LATICACIDVEN 11.7* 10.4* 9.9*  --   --  2.5* 2.1*  --   --   --   --   --   --   --    < > = values in this interval not displayed.    Estimated Creatinine Clearance: 55.5 mL/min (by C-G formula based on SCr of 1.2 mg/dL).    Allergies  Allergen Reactions  . Tramadol Nausea And Vomiting   Levester Fresh, PharmD, BCPS, BCCCP Clinical Pharmacist Clinical phone for 12/13/2017 from 0830 - 2100: [phone] If after 2100, please call main pharmacy at: [phone] 12/13/2017 4:26 PM

## 2017-12-13 NOTE — Progress Notes (Signed)
[facility] for heparin Indication: atrial fibrillation  Hep lvl < 0.1 hep was off for 2 hours d/t loss of iv access Resumed now Will re check labs in am  Levester Fresh, PharmD, BCPS, BCCCP Clinical Pharmacist [phone]  Please check AMION for all [facility] numbers  12/13/2017 10:26 PM

## 2017-12-13 NOTE — Progress Notes (Signed)
Referring Physician(s): * No referring provider recorded for this case *  Supervising Physician: Arne Cleveland  Patient Status:  [facility] - In-pt  Chief Complaint: Cholecystitis  Subjective: Remains intubated.  Requiring pressors.  Remains on abx.   Allergies: Tramadol  Medications: Prior to Admission medications   Medication Sig Start Date End Date Taking? Authorizing Provider  colchicine 0.6 MG tablet Take 0.6 mg by mouth 2 (two) times daily.    Yes [provider]  dabigatran (PRADAXA) 150 MG CAPS capsule Take 150 mg by mouth 2 (two) times daily.   Yes [provider]  docusate sodium (COLACE) 100 MG capsule Take 100 mg by mouth daily.   Yes [provider]  DULoxetine (CYMBALTA) 30 MG capsule Take 30 mg by mouth daily.   Yes [provider]  metoprolol tartrate (LOPRESSOR) 25 MG tablet Take 12.5-25 mg by mouth See admin instructions. Take 12.5 mg by mouth in the morning and 25 mg in the evening   Yes [provider]  potassium chloride SA (K-DUR,KLOR-CON) 20 MEQ tablet Take 20 mEq by mouth 2 (two) times daily.   Yes [provider]  torsemide (DEMADEX) 20 MG tablet Take 40-60 mg by mouth See admin instructions. Take 60 mg by mouth in the morning and 40 mg midday for fluid/edema 11/19/16  Yes Martinique, Peter M, MD  metoprolol succinate (TOPROL-XL) 25 MG 24 hr tablet Take 1 tablet (25 mg total) by mouth 3 (three) times daily. PLEASE CONTACT OFFICE FOR ADDITIONAL REFILLS Patient not taking: Reported on 11/24/2017 09/11/16   Martinique, Peter M, MD     Vital Signs: BP (!) 108/54   Pulse (!) 124   Temp (!) 102.4 F (39.1 C) (Axillary)   Resp 19   Ht 5\' 8"  (1.727 m)   Wt 200 lb 9.9 oz (91 kg)   SpO2 100%   BMI 30.50 kg/m   Physical Exam  Intubated, sedated, responds to pain Abdomen:  Soft, percutaneous cholecystostomy tube in place.  Insertion site intact with small amount of leakage around tube. No erythema.  Light colored  bile in collection bag.   Imaging: Ct Chest W Contrast  Result Date: 12/12/2017 CLINICAL DATA:  Pneumonia.  Abdominal pain. EXAM: CT CHEST, ABDOMEN, AND PELVIS WITH CONTRAST TECHNIQUE: Multidetector CT imaging of the chest, abdomen and pelvis was performed following the standard protocol during bolus administration of intravenous contrast. CONTRAST:  100 cc Omnipaque 300 intravenous contrast. COMPARISON:  CT abdomen pelvis dated December 07, 2017. CT chest dated February 15, 2015. FINDINGS: CT CHEST FINDINGS Cardiovascular: Severe biatrial enlargement. No pericardial effusion. Normal caliber thoracic aorta. Coronary, aortic arch, and branch vessel atherosclerotic vascular disease. 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 No central pulmonary embolism. Right internal jugular central venous catheter with the tip at the cavoatrial junction. Mediastinum/Nodes: Multiple enlarged mediastinal lymph nodes, the largest a right paratracheal lymph node measuring 1.7 cm in short axis, previously 1.1 cm. Mildly enlarged left hilar lymph node measuring 1.0 cm in short axis. No axillary lymphadenopathy. The thyroid gland, trachea, and esophagus demonstrate no significant findings. Lungs/Pleura: Endotracheal tube in place with the tip 2.7 cm above the carina. Increased now moderate bilateral pleural effusions with associated bilateral lower lobe atelectasis/consolidation. Portions of the right lower lobe consolidation do not enhance, consistent with pneumonia. Mild diffuse smooth interlobular septal thickening. No pneumothorax. No suspicious pulmonary nodule. Scattered mild subpleural reticulation. Musculoskeletal: No acute or significant osseous findings.  CT ABDOMEN PELVIS FINDINGS Hepatobiliary: Questionable liver surface nodularity. No focal liver abnormality. The gallbladder is decompressed by a percutaneous cholecystostomy tube. No biliary dilatation.  Pancreas: Unremarkable. No pancreatic ductal dilatation or surrounding inflammatory changes. Spleen: Normal in size without focal abnormality. Adrenals/Urinary Tract: The adrenal glands are unremarkable. Unchanged punctate calculi in the lower poles of both kidneys. No hydronephrosis. The bladder is decompressed by Foley catheter. Stomach/Bowel: Enteric tube with the tip in the gastric antrum. The stomach is within normal limits. No bowel wall thickening, distention, or surrounding inflammatory changes. Appendix is surgically absent. Vascular/Lymphatic: Aortic atherosclerosis. Unchanged mild ectasia of the infrarenal abdominal aorta, measuring up to 2.5 cm. No enlarged abdominal or pelvic lymph nodes. Reproductive: Prostate is unremarkable. Other: Small ascites, predominantly around the spleen and in the inferior paracolic gutters. No pneumoperitoneum. Musculoskeletal: No acute or significant osseous findings. Small amount of air in the right lateral upper abdominal wall related to prior percutaneous cholecystostomy tube placement. IMPRESSION: Chest: 1. Increased now moderate bilateral pleural effusions. 2. Associated bilateral lower lobe atelectasis/consolidation. Portions of the right lower lobe consolidation do not enhance, consistent with pneumonia. 3. Mild pulmonary interstitial edema. 4. Mildly enlarged mediastinal lymph nodes, presumably reactive. 5. Aortic atherosclerosis (ICD10-I70.0). 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 Abdomen and pelvis: 1.  No acute intra-abdominal process. 2. Questionable liver surface nodularity may reflect cirrhosis. 3. Unchanged bilateral punctate nephrolithiasis. 4. Small ascites. 5. 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 Recommend followup by ultrasound in 5 years. This recommendation follows ACR consensus guidelines: White Paper of the ACR Incidental Findings Committee II on  Vascular Findings. J Am Coll Radiol 2013; 10:789-794. Electronically Signed   By: Titus Dubin M.D.   On: 12/12/2017 12:00   Mr Brain Wo Contrast  Result Date: 12/12/2017 CLINICAL DATA:  77 y/o M; worsening mental status requiring intubation. EXAM: MRI HEAD WITHOUT CONTRAST TECHNIQUE: Multiplanar, multiecho pulse sequences of the brain and surrounding structures were obtained without intravenous contrast. 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 Few nonspecific foci of T2 FLAIR hyperintense signal abnormality in subcortical and periventricular white matter are compatible with chronic microvascular ischemic changes for age. No acute/early subacute infarct, hemorrhage, focal mass effect, extra-axial collection, or herniation. Vascular: Normal flow voids. Skull and upper cervical spine: Normal marrow signal. Sinuses/Orbits: Mild diffuse paranasal sinus mucosal thickening and small right maxillary fluid level, probably due to intubation. Mild increased signal within the mastoid air cells. Bilateral intra-ocular lens replacement. Other: None. IMPRESSION: 1. No acute intracranial abnormality identified. 2. 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 Mild chronic microvascular ischemic changes of white matter. Electronically Signed   By: Kristine Garbe M.D.   On: 12/12/2017 04:37   Ct Abdomen Pelvis W Contrast  Result Date: 12/12/2017 CLINICAL DATA:  Pneumonia.  Abdominal pain. EXAM: CT CHEST, ABDOMEN, AND PELVIS WITH CONTRAST TECHNIQUE: Multidetector CT imaging of the chest, abdomen and pelvis was performed following the standard protocol during bolus administration of intravenous contrast. CONTRAST:  100 cc Omnipaque 300 intravenous contrast. COMPARISON:  CT  abdomen pelvis dated December 07, 2017. CT chest dated February 15, 2015. FINDINGS: CT CHEST FINDINGS Cardiovascular: Severe biatrial enlargement. No pericardial effusion. Normal caliber thoracic aorta. Coronary, aortic arch, and branch vessel atherosclerotic vascular disease. Severe stenosis of the  brachiocephalic artery origin. 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 No central pulmonary embolism. Right internal jugular central venous catheter with the tip at the cavoatrial junction. Mediastinum/Nodes: Multiple enlarged mediastinal lymph nodes, the largest a right paratracheal lymph node measuring 1.7 cm in short axis, previously 1.1 cm. Mildly enlarged left hilar lymph node measuring 1.0 cm in short axis. No axillary lymphadenopathy. The thyroid gland, trachea, and esophagus demonstrate no significant findings. Lungs/Pleura: Endotracheal tube in place with the tip 2.7 cm above the carina. Increased now moderate bilateral pleural effusions with associated bilateral lower lobe atelectasis/consolidation. Portions of the right lower lobe consolidation do not enhance, consistent with pneumonia. Mild diffuse smooth interlobular septal thickening. No pneumothorax. No suspicious pulmonary nodule. Scattered mild subpleural reticulation. Musculoskeletal: No acute or significant osseous findings. CT ABDOMEN PELVIS FINDINGS Hepatobiliary: Questionable liver surface nodularity. No focal liver abnormality. The gallbladder is decompressed by a percutaneous cholecystostomy tube. No biliary dilatation. Pancreas: Unremarkable. No pancreatic ductal dilatation or surrounding inflammatory changes. Spleen: Normal in size without focal abnormality. Adrenals/Urinary Tract: The adrenal glands are unremarkable. Unchanged punctate calculi in the lower poles of both kidneys. No hydronephrosis. The bladder is decompressed by Foley catheter. Stomach/Bowel: Enteric tube with the tip in the gastric antrum. The stomach is within  normal limits. No bowel wall thickening, distention, or surrounding inflammatory changes. Appendix is surgically absent. Vascular/Lymphatic: Aortic atherosclerosis. Unchanged mild ectasia of the infrarenal abdominal aorta, measuring up to 2.5 cm. No enlarged abdominal or pelvic lymph nodes. Reproductive: Prostate is unremarkable. Other: Small ascites, predominantly around the spleen and in the inferior paracolic gutters. No pneumoperitoneum. Musculoskeletal: No acute or significant osseous findings. Small amount of air in the right lateral upper abdominal wall related to prior percutaneous cholecystostomy tube placement. IMPRESSION: Chest: 1. Increased now moderate bilateral pleural effusions. 2. Associated bilateral lower lobe atelectasis/consolidation. Portions of the right lower lobe consolidation do not enhance, consistent with pneumonia. 3. Mild pulmonary interstitial edema. 4. Mildly enlarged mediastinal lymph nodes, presumably reactive. 5. Aortic atherosclerosis (ICD10-I70.0). 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 Abdomen and pelvis: 1.  No acute intra-abdominal process. 2. Questionable liver surface nodularity may reflect cirrhosis. 3. Unchanged bilateral punctate nephrolithiasis. 4. Small ascites. 5. 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 Recommend followup by ultrasound in 5 years. This recommendation follows ACR consensus guidelines: White Paper of the ACR Incidental Findings Committee II on Vascular Findings. J Am Coll Radiol 2013; 10:789-794. Electronically Signed   By: Titus Dubin M.D.   On: 12/12/2017 12:00   Dg Chest Port 1 View  Result Date: 12/13/2017 CLINICAL DATA:  Acute respiratory failure. EXAM: PORTABLE CHEST 1 VIEW COMPARISON:  12/11/2017 and prior exams FINDINGS: Endotracheal tube with tip 3.5 cm above the carina and NG tube with tip off the field of view again noted. LEFT IJ central  venous catheter has been removed. Cardiomegaly with pulmonary vascular congestion, bilateral LOWER lung opacities and RIGHT pleural effusion again noted. There is no evidence of pneumothorax. IMPRESSION: Removal of LEFT central venous catheter, otherwise unchanged appearance of the chest with cardiomegaly, pulmonary vascular congestion, bilateral LOWER lung opacities and RIGHT pleural effusion. Electronically Signed   By: Margarette Canada M.D.   On: 12/13/2017 11:03   Dg Chest Port 1 View  Result Date: 12/11/2017 CLINICAL DATA:  Respiratory failure EXAM: PORTABLE CHEST 1 VIEW COMPARISON:  12/10/2017 FINDINGS: Cardiac shadow remains enlarged. Endotracheal tube, nasogastric catheter and right jugular central line  are again seen and stable. Bilateral basilar atelectasis is noted and stable. Small right pleural effusion is again seen. No bony abnormality is noted. IMPRESSION: Bibasilar atelectasis and small right pleural effusion. Electronically Signed   By: Inez Catalina M.D.   On: 12/11/2017 08:33   Dg Chest Port 1 View  Result Date: 12/10/2017 CLINICAL DATA:  Intubated EXAM: PORTABLE CHEST 1 VIEW COMPARISON:  12/07/2017 FINDINGS: Endotracheal tube well positioned in the mid trachea 5.2 cm above the carina. NG tube extends below the hemidiaphragms with the tip not visualized. Right IJ central line tip distal SVC. Stable cardiomegaly slight worsening basilar atelectasis. Negative for edema or significant pneumonia. Trace pleural effusions. Aorta is atherosclerotic. Degenerative changes of the spine and shoulders. IMPRESSION: Endotracheal tube 5.2 cm above the carina. Cardiomegaly with slight worsening basilar atelectasis and trace pleural effusions. Electronically Signed   By: Jerilynn Mages.  Shick M.D.   On: 12/10/2017 18:44   Dg Abd Portable 1v  Result Date: 12/10/2017 CLINICAL DATA:  Orogastric tube placement. EXAM: PORTABLE ABDOMEN - 1 VIEW COMPARISON:  Abdomen and pelvis CT dated 12/07/2017. FINDINGS: Interval  orogastric tube with its tip in the region of the distal gastric antrum and side hole in the mid stomach. Paucity of intestinal gas with no dilated bowel loops seen. Interval right mid abdominal pigtail catheter and left lower lobe airspace opacity. Thoracic and lower cervical spine degenerative changes. IMPRESSION: 1. Orogastric tube tip in the distal stomach. 2. Interval left lower lobe atelectasis or pneumonia. Electronically Signed   By: Claudie Revering M.D.   On: 12/10/2017 19:36    Labs:  CBC: Recent Labs    12/09/17 0407 12/11/17 0930 12/12/17 0308 12/13/17 0500  WBC 6.5 6.3 7.1 11.9*  HGB 8.9* 8.8* 9.2* 10.0*  HCT 28.8* 30.6* 32.0* 34.8*  PLT 93* 64* 63* 52*    COAGS: Recent Labs    12/08/17 0920  12/08/17 1500  12/09/17 0407 12/09/17 1115 12/11/17 0523 12/12/17 0308 12/13/17 0656  INR 7.71*   < > 7.37*   < > 4.38* 3.59 2.48 2.07 1.99  APTT 81*  --  86*  --  52*  --   --   --   --    < > = values in this interval not displayed.    BMP: Recent Labs    12/11/17 1512 12/12/17 0308 12/12/17 2127 12/13/17 0500  NA 153* 152* 149* 148*  K 3.4* 3.2* 3.6 3.5  CL 110 114* 113* 115*  CO2 35* 31 28 25   GLUCOSE 166* 156* 176* 195*  BUN 27* 27* 30* 28*  CALCIUM 8.3* 7.9* 7.7* 7.6*  CREATININE 1.45* 1.26* 1.33* 1.20  GFRNONAA 45* 53* 50* 56*  GFRAA 52* >60 57* >60    LIVER FUNCTION TESTS: Recent Labs    12/11/17 1721 12/12/17 0308 12/12/17 1645 12/13/17 0500  BILITOT 13.1* 13.4* 13.8* 13.4*  AST 293* 211* 130* 67*  ALT 369* 318* 245* 185*  ALKPHOS 48 48 49 47  PROT 6.5 5.4* 5.5* 4.9*  ALBUMIN 2.3* 2.1* 1.9* 1.7*    Assessment and Plan: Cholecystitis Percutaneous drain placed 6/24.  Remains in place with continued bilious output.  Patient condition not improved.  Elevated temps.  Intubated and on pressors.  Tbili stable at 13.4 Continue current management.  Electronically Signed: Docia Barrier, PA 12/13/2017, 5:12 PM   I spent a total of  15 Minutes at the the patient's bedside AND on the patient's hospital floor or unit, greater than 50% of which  was counseling/coordinating care for cholecystitis.

## 2017-12-13 NOTE — Progress Notes (Signed)
PT Cancellation Note  Patient Details Name: WILDER KUROWSKI MRN: [id number] DOB: 05-Jun-1941   Cancelled Treatment:    Reason Eval/Treat Not Completed: Medical issues which prohibited therapy, intubated after initial eval.  Not able to participate at this time.  Will check back on Monday 7/1.   Reginia Naas 12/13/2017, 3:12 PM  Magda Kiel, [place] 12/13/2017

## 2017-12-13 NOTE — Progress Notes (Signed)
[place] for heparin Indication: atrial fibrillation  Heparin Dosing Weight: 87.2 kg  Labs: Recent Labs    12/11/17 0523  12/11/17 0930  12/12/17 0308 12/12/17 2127 12/13/17 0500 12/13/17 0656  HGB  --    < > 8.8*  --  9.2*  --  10.0*  --   HCT  --   --  30.6*  --  32.0*  --  34.8*  --   PLT  --   --  64*  --  63*  --  52*  --   LABPROT 26.7*  --   --   --  23.2*  --   --  22.5*  INR 2.48  --   --   --  2.07  --   --  1.99  CREATININE 1.58*  --   --    < > 1.26* 1.33* 1.20  --    < > = values in this interval not displayed.    Assessment: 78 yom on Pradaxa PTA (last dose 6/21) for afib originally presented with afib with RVR, AKI (improving), and acute liver failure (LFTs remain elevated but trending down). Of note, was reversed with Pradaxa on 6/22 and given FFP/vitamin K x 2 days due to significant coagulopathy per CCM. INR > 10 on admit, now down to 1.99. Hg low but stable, plt trend down to 52 (175 on 6/21). Spoke with Dr. Debbora Dus - ok to start heparin with current plt level and monitor trend. Patient has only been on SCDs since Pradaxa held 6/21. No active bleed issues reported this admit.  Goal of Therapy:  Heparin level 0.3-0.7 units/ml Monitor platelets by anticoagulation protocol: Yes   Plan:  No bolus Start heparin at 1300 units/hr 8h heparin level Daily heparin level/CBC Monitor closely for s/sx bleeding  Elicia Lamp, PharmD, BCPS Clinical Pharmacist 12/13/2017 10:43 AM

## 2017-12-13 NOTE — Progress Notes (Signed)
eLink Physician-Brief Progress Note Patient Name: Jacob Shannon DOB: Dec 04, 1940 MRN: [id number]   Date of Service  12/13/2017  HPI/Events of Note  Hypotension following accidental dislodgement of central line. Pt was on high dose Levophed.  eICU Interventions  Dr. Phylliss Bob asked to see pt stat to replace central line, IO catheter requested, pt placed in trendelenburg, peripheral iv started by RN with 1000 ml saline bolus started and Levophed run through peripheral line pending establishment of central access. Epinephrine 100 mics given iv x 1 via peripheral line for SBP 60 mm Hg. Patient maintained a palpable pulse throughout.         Kerry Kass [place] 12/13/2017, 6:32 AM

## 2017-12-13 NOTE — Progress Notes (Signed)
  Amiodarone Drug - Drug Interaction Consult Note  Recommendations: No drug interactions noted with current meds Amiodarone is metabolized by the cytochrome P450 system and therefore has the potential to cause many drug interactions. Amiodarone has an average plasma half-life of 50 days (range 20 to 100 days).   There is potential for drug interactions to occur several weeks or months after stopping treatment and the onset of drug interactions may be slow after initiating amiodarone.   []  Statins: Increased risk of myopathy. Simvastatin- restrict dose to 20mg  daily. Other statins: counsel patients to report any muscle pain or weakness immediately.  []  Anticoagulants: Amiodarone can increase anticoagulant effect. Consider warfarin dose reduction. Patients should be monitored closely and the dose of anticoagulant altered accordingly, remembering that amiodarone levels take several weeks to stabilize.  []  Antiepileptics: Amiodarone can increase plasma concentration of phenytoin, the dose should be reduced. Note that small changes in phenytoin dose can result in large changes in levels. Monitor patient and counsel on signs of toxicity.  []  Beta blockers: increased risk of bradycardia, AV block and myocardial depression. Sotalol - avoid concomitant use.  []   Calcium channel blockers (diltiazem and verapamil): increased risk of bradycardia, AV block and myocardial depression.  []   Cyclosporine: Amiodarone increases levels of cyclosporine. Reduced dose of cyclosporine is recommended.  []  Digoxin dose should be halved when amiodarone is started.  []  Diuretics: increased risk of cardiotoxicity if hypokalemia occurs.  []  Oral hypoglycemic agents (glyburide, glipizide, glimepiride): increased risk of hypoglycemia. Patient's glucose levels should be monitored closely when initiating amiodarone therapy.   []  Drugs that prolong the QT interval:  Torsades de pointes risk may be increased with concurrent  use - avoid if possible.  Monitor QTc, also keep magnesium/potassium WNL if concurrent therapy can't be avoided. Marland Kitchen Antibiotics: e.g. fluoroquinolones, erythromycin. . Antiarrhythmics: e.g. quinidine, procainamide, disopyramide, sotalol. . Antipsychotics: e.g. phenothiazines, haloperidol.  . Lithium, tricyclic antidepressants, and methadone. Thank You,  Excell Seltzer [place]et  12/13/2017 7:04 AM

## 2017-12-13 NOTE — Progress Notes (Signed)
PULMONARY / CRITICAL CARE MEDICINE   Name: Jacob Shannon MRN: [id number] DOB: 08-02-1940    ADMISSION DATE:  12/11/2017 CONSULTATION DATE:  12/07/2017  REFERRING MD:  [facility] Dr. Myna Hidalgo  CHIEF COMPLAINT:  Hypoxic/lethargic   Brief HPI:   Jacob Shannon is a 77 y.o. male with new onset altered mental status who was admitted with A-fib RVR and required diltiazem infusion to control. Over the last 24-48hrs has become more lethargic and also complaining of gastric discomfort and pain. Laboratory assessment has been an issue as he has vasculitis and poor access. Additionally, the patient has a worsening renal function with metabolic acidosis on recent ABG.  6/26 The patient got prregressively more obtunded and required intubation yesterday afternoon. He is intubated and on propofl for sedation.  6/27 The a patient is a little hypotensive this AM. His BP is running 53-97 systolic. His sodium is up a bit. He had been getting dextrose overnight. He remaisn quite obtunded. His jaundice appears a bit worse a swell. His renal fn ahs improved overnight.  6/28 He patient had some difficulties earlier. Somehow his CVP came out. The patient became hypotensive and almost arrested. He had a new line replaced. BP is about 110-120.He is on levophed about 18 mcg. The patient developed rapid atrial fib and is now on amidorone as well. In addition his INR is now 2 so I am going to start a heparin infusion. The patient remains quite jaundiced with bili of 8.    SUBJECTIVE:  Status post interventional radiology place and drain.  Remains lethargic.  Ammonia levels noted to be 50.  VITAL SIGNS: BP 107/68   Pulse (!) 117   Temp 99.6 F (37.6 C) (Axillary)   Resp (!) 22   Ht '5\' 8"'$  (1.727 m)   Wt 200 lb 9.9 oz (91 kg)   SpO2 100%   BMI 30.50 kg/m   HEMODYNAMICS: Became hypotensive anfd tachycardic ealry this AM.;levophed pof 18 mcg/min presently    INTAKE / OUTPUT: I/O last 3 completed shifts: In:  7580.1 [I.V.:4841; Other:10; NG/GT:1830; IV Piggyback:899.1] Out: 3905 [Urine:2180; Drains:225; Stool:1500]  PHYSICAL EXAMINATION: General: Frail elderly male poorly responsive. Quite jaundiced HEENT: Oral mucosa is dry, no JVD lymphadenopathy is appreciated.  Pupils equal reactive asand 37m. He is now intubated Neuro: Not arousable, not moving CV: Heart sounds are regular atrial fibrillation with controlled ventricular rate of 93 PULM: Decreased breath sounds at bases, periods of apnea, respiratory his respirations at times. GI: Tender to touch, right percutaneous drain right outer quadrant noted Extremities: warm/dry, positive edema, bilateral bunions are noted Skin: no rashes or lesions   LABS:  BMET Recent Labs  Lab 12/12/17 0308 12/12/17 2127 12/13/17 0500  NA 152* 149* 148*  K 3.2* 3.6 3.5  CL 114* 113* 115*  CO2 '31 28 25  '$ BUN 27* 30* 28*  CREATININE 1.26* 1.33* 1.20  GLUCOSE 156* 176* 195*    Electrolytes Recent Labs  Lab 12/12/17 0308 12/12/17 1645 12/12/17 2127 12/13/17 0500  CALCIUM 7.9*  --  7.7* 7.6*  MG 2.0 1.5* 1.7 2.0  PHOS 2.3* 1.4* 1.4* 2.9    CBC Recent Labs  Lab 12/11/17 0930 12/12/17 0308 12/13/17 0500  WBC 6.3 7.1 11.9*  HGB 8.8* 9.2* 10.0*  HCT 30.6* 32.0* 34.8*  PLT 64* 63* 52*    Coag's Recent Labs  Lab 12/08/17 0920  12/08/17 1500  12/09/17 0407  12/11/17 0523 12/12/17 0308 12/13/17 0656  APTT 81*  --  86*  --  52*  --   --   --   --   INR 7.71*   < > 7.37*   < > 4.38*   < > 2.48 2.07 1.99   < > = values in this interval not displayed.    Sepsis Markers Recent Labs  Lab 12/08/17 0000  12/08/17 1728 12/09/17 0407 12/12/17 0308 12/13/17 0500  LATICACIDVEN 9.9*  --  2.5* 2.1*  --   --   PROCALCITON  --    < >  --  2.11 1.27 3.46   < > = values in this interval not displayed.    ABG Recent Labs  Lab 12/11/17 1250 12/12/17 0853 12/13/17 0534  PHART 7.519* 7.467* 7.423  PCO2ART 42.0 42.3 33.4  PO2ART 150.0*  94.9 126.0*    Liver Enzymes Recent Labs  Lab 12/12/17 0308 12/12/17 1645 12/13/17 0500  AST 211* 130* 67*  ALT 318* 245* 185*  ALKPHOS 48 49 47  BILITOT 13.4* 13.8* 13.4*  ALBUMIN 2.1* 1.9* 1.7*    Cardiac Enzymes Recent Labs  Lab 11/22/2017 2150 12/07/17 1733 12/08/17 1700  TROPONINI 0.05* 0.18* 0.12*    Glucose Recent Labs  Lab 12/12/17 1253 12/12/17 1522 12/12/17 2032 12/12/17 2334 12/13/17 0356 12/13/17 0755  GLUCAP 101* 122* 133* 148* 161* 95    Imaging Ct Chest W Contrast  Result Date: 12/12/2017 CLINICAL DATA:  Pneumonia.  Abdominal pain. EXAM: CT CHEST, ABDOMEN, AND PELVIS WITH CONTRAST TECHNIQUE: Multidetector CT imaging of the chest, abdomen and pelvis was performed following the standard protocol during bolus administration of intravenous contrast. CONTRAST:  100 cc Omnipaque 300 intravenous contrast. COMPARISON:  CT abdomen pelvis dated December 07, 2017. CT chest dated February 15, 2015. FINDINGS: CT CHEST FINDINGS Cardiovascular: Severe biatrial enlargement. No pericardial effusion. Normal caliber thoracic aorta. Coronary, aortic arch, and branch vessel atherosclerotic vascular disease. 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 No central pulmonary embolism. Right internal jugular central venous catheter with the tip at the cavoatrial junction. Mediastinum/Nodes: Multiple enlarged mediastinal lymph nodes, the largest a right paratracheal lymph node measuring 1.7 cm in short axis, previously 1.1 cm. Mildly enlarged left hilar lymph node measuring 1.0 cm in short axis. No axillary lymphadenopathy. The thyroid gland, trachea, and esophagus demonstrate no significant findings. Lungs/Pleura: Endotracheal tube in place with the tip 2.7 cm above the carina. Increased now moderate bilateral pleural effusions with associated bilateral lower lobe atelectasis/consolidation. Portions of the right lower lobe  consolidation do not enhance, consistent with pneumonia. Mild diffuse smooth interlobular septal thickening. No pneumothorax. No suspicious pulmonary nodule. Scattered mild subpleural reticulation. Musculoskeletal: No acute or significant osseous findings. CT ABDOMEN PELVIS FINDINGS Hepatobiliary: Questionable liver surface nodularity. No focal liver abnormality. The gallbladder is decompressed by a percutaneous cholecystostomy tube. No biliary dilatation. Pancreas: Unremarkable. No pancreatic ductal dilatation or surrounding inflammatory changes. Spleen: Normal in size without focal abnormality. Adrenals/Urinary Tract: The adrenal glands are unremarkable. Unchanged punctate calculi in the lower poles of both kidneys. No hydronephrosis. The bladder is decompressed by Foley catheter. Stomach/Bowel: Enteric tube with the tip in the gastric antrum. The stomach is within normal limits. No bowel wall thickening, distention, or surrounding inflammatory changes. Appendix is surgically absent. Vascular/Lymphatic: Aortic atherosclerosis. Unchanged mild ectasia of the infrarenal abdominal aorta, measuring up to 2.5 cm. No enlarged abdominal or pelvic lymph nodes. Reproductive: Prostate is unremarkable. Other: Small ascites, predominantly around the spleen and in the inferior paracolic gutters. No pneumoperitoneum. Musculoskeletal: No  acute or significant osseous findings. Small amount of air in the right lateral upper abdominal wall related to prior percutaneous cholecystostomy tube placement. IMPRESSION: Chest: 1. Increased now moderate bilateral pleural effusions. 2. Associated bilateral lower lobe atelectasis/consolidation. Portions of the right lower lobe consolidation do not enhance, consistent with pneumonia. 3. Mild pulmonary interstitial edema. 4. Mildly enlarged mediastinal lymph nodes, presumably reactive. 5. Aortic atherosclerosis (ICD10-I70.0). 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 Moderate  stenosis of the proximal left subclavian artery. Abdomen and pelvis: 1.  No acute intra-abdominal process. 2. Questionable liver surface nodularity may reflect cirrhosis. 3. Unchanged bilateral punctate nephrolithiasis. 4. Small ascites. 5. 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 Recommend followup by ultrasound in 5 years. This recommendation follows ACR consensus guidelines: White Paper of the ACR Incidental Findings Committee II on Vascular Findings. J Am Coll Radiol 2013; 10:789-794. Electronically Signed   By: Titus Dubin M.D.   On: 12/12/2017 12:00   Ct Abdomen Pelvis W Contrast  Result Date: 12/12/2017 CLINICAL DATA:  Pneumonia.  Abdominal pain. EXAM: CT CHEST, ABDOMEN, AND PELVIS WITH CONTRAST TECHNIQUE: Multidetector CT imaging of the chest, abdomen and pelvis was performed following the standard protocol during bolus administration of intravenous contrast. CONTRAST:  100 cc Omnipaque 300 intravenous contrast. COMPARISON:  CT abdomen pelvis dated December 07, 2017. CT chest dated February 15, 2015. FINDINGS: CT CHEST FINDINGS Cardiovascular: Severe biatrial enlargement. No pericardial effusion. Normal caliber thoracic aorta. Coronary, aortic arch, and branch vessel atherosclerotic vascular disease. 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 No central pulmonary embolism. Right internal jugular central venous catheter with the tip at the cavoatrial junction. Mediastinum/Nodes: Multiple enlarged mediastinal lymph nodes, the largest a right paratracheal lymph node measuring 1.7 cm in short axis, previously 1.1 cm. Mildly enlarged left hilar lymph node measuring 1.0 cm in short axis. No axillary lymphadenopathy. The thyroid gland, trachea, and esophagus demonstrate no significant findings. Lungs/Pleura: Endotracheal tube in place with the tip 2.7 cm above the carina. Increased now moderate bilateral pleural  effusions with associated bilateral lower lobe atelectasis/consolidation. Portions of the right lower lobe consolidation do not enhance, consistent with pneumonia. Mild diffuse smooth interlobular septal thickening. No pneumothorax. No suspicious pulmonary nodule. Scattered mild subpleural reticulation. Musculoskeletal: No acute or significant osseous findings. CT ABDOMEN PELVIS FINDINGS Hepatobiliary: Questionable liver surface nodularity. No focal liver abnormality. The gallbladder is decompressed by a percutaneous cholecystostomy tube. No biliary dilatation. Pancreas: Unremarkable. No pancreatic ductal dilatation or surrounding inflammatory changes. Spleen: Normal in size without focal abnormality. Adrenals/Urinary Tract: The adrenal glands are unremarkable. Unchanged punctate calculi in the lower poles of both kidneys. No hydronephrosis. The bladder is decompressed by Foley catheter. Stomach/Bowel: Enteric tube with the tip in the gastric antrum. The stomach is within normal limits. No bowel wall thickening, distention, or surrounding inflammatory changes. Appendix is surgically absent. Vascular/Lymphatic: Aortic atherosclerosis. Unchanged mild ectasia of the infrarenal abdominal aorta, measuring up to 2.5 cm. No enlarged abdominal or pelvic lymph nodes. Reproductive: Prostate is unremarkable. Other: Small ascites, predominantly around the spleen and in the inferior paracolic gutters. No pneumoperitoneum. Musculoskeletal: No acute or significant osseous findings. Small amount of air in the right lateral upper abdominal wall related to prior percutaneous cholecystostomy tube placement. IMPRESSION: Chest: 1. Increased now moderate bilateral pleural effusions. 2. Associated bilateral lower lobe atelectasis/consolidation. Portions of the right lower lobe consolidation do not enhance, consistent with pneumonia. 3. Mild pulmonary interstitial edema. 4. Mildly  enlarged mediastinal lymph nodes, presumably reactive. 5.  Aortic atherosclerosis (ICD10-I70.0). 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 Abdomen and pelvis: 1.  No acute intra-abdominal process. 2. Questionable liver surface nodularity may reflect cirrhosis. 3. Unchanged bilateral punctate nephrolithiasis. 4. Small ascites. 5. 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 Recommend followup by ultrasound in 5 years. This recommendation follows ACR consensus guidelines: White Paper of the ACR Incidental Findings Committee II on Vascular Findings. J Am Coll Radiol 2013; 10:789-794. Electronically Signed   By: Titus Dubin M.D.   On: 12/12/2017 12:00     STUDIES:  CTH 6/21>>NAICA, old infarct CT A/P 6/22>>Mild ascites with cholelithiasis and possible sludge  HIDA 6/23>>1. Nonvisualization of the gallbladder and small bowel with no evidence of radiotracer within the common bile duct. Persistent activity noted throughout the liver. Findings support hepatocellular dysfunction, with depressed hepatic excretion of bile. 2. Acute cholecystitis is not excluded.   CULTURES: BCx 6/22>>NGTD 12/09/2017 percutaneous drainage fluid>>  ANTIBIOTICS: Vancomycin 6/21>>6/22 Zosyn 6/21>>>  SIGNIFICANT EVENTS: -INR elevated >10 and treated with Vit K/Praxbind. Repeat 3>>>7 -LFTs continue to worsen  -IR placed right Foley drain 12/09/2017  LINES/TUBES: CVC RIJ 6/22>>  DISCUSSION: Jacob Shannon is a 77 y.o. male with acute hepatic failure with unclear etiology and associated AKI who initially presented with A-fib with RVR. Significantly coagulopathic that was reversed with Vit K and Praxbind. Continues to have worsening LFTs and underwent CT of A/P that revealed possible cholecystitis that was then confirmed with HIDA scan. IR on board and may take down for percutaneous drainage once INR improves. Receiving FFP and reaching out to Hepatology for further assistance in  workup/managment. Hemodynamically remains stable with MAP 80-90 and only on minimal supplemental oxygen.  12/09/2017 are placed percutaneous drain on the right.  His mental status continues to be lethargic his ammonia level is 50.  Question component of obstructive sleep apnea  ASSESSMENT / PLAN:  PULMONARY A: Minimally hypoxic  Suspected underlying OSA Patient intubated yesterday in the afternoon for worsening obtundation. He had thick sectretions in the upper airway. CXR suggest bibasilar atelectasis vs. Pneumonia in the lower lobes.  The patient's CT scan confirmed bilateral large lower lobe infiltrates. Gram stain many WBCs but few organisms.    CARDIOVASCULAR  Patient is rapid Atrial fib. Has been placed on IV amiodarone today. Heart rate about 110 presently. He is requiring pressors and is on levophed 18 mcg/min presently             RENAL Lab Results  Component Value Date   CREATININE 1.20 12/13/2017   CREATININE 1.33 (H) 12/12/2017   CREATININE 1.26 (H) 12/12/2017   CREATININE 0.99 09/09/2015   Recent Labs  Lab 12/12/17 0308 12/12/17 2127 12/13/17 0500  K 3.2* 3.6 3.5   His output remains reasonably OK. His creatinine Has trended down the past few days.    GASTROINTESTINAL A:   Acute liver failure, unclear etiology  Cholecystitis  P:   Percutaneous drain placed on 12/09/2017. His bili continues toi climb daily. Alk phos stable. I am going to ask Gi  To weigh in on his condition. I would like to send him for another CT scan but would prefer to do do so when his creatinine has improved some. I am getting CT abdo and pelvis today. His total bili has been climbing thepast few days despite his recent cholecystostomy.  CT scan did not show evidence of a  phlegmon or abscess. Patient had small ascites not likely worth pursuing. There appeared to be some evidence of cirrhosis by CT scan The patient is on lactulose. Last ammonia level was only 45 on  6/25.   HEMATOLOGIC Lab Results  Component Value Date   INR 1.99 12/13/2017   INR 2.07 12/12/2017   INR 2.48 12/11/2017  HB Hb appears to be reasoanbly stable and is 10 today.    Coagulopathy  INR is now 2. Will place thepatient on heparin. He has a slowly dropping platelet count but it does not appear that he received heparin this admission. Will monitor platelet count which is presently 52.   INFECTIOUS A:   Cholecystitis  P:   Percutaneous drain placed per IR on 12/09/2017 Continue n.p.o. Continue antimicrobial therapy Monitor culture data, white blood cell count, temperature curve. The patient continues to run rahter high fevers despite abx and cholecystostomy. Blood cultures sent yesterday. No growth thus far. Resp culture many [facility] but no organisms. Pastient presently on Vanco and Zosyn.       ENDOCRINE CBG (last 3)  Recent Labs    12/12/17 2334 12/13/17 0356 12/13/17 0755  GLUCAP 148* 161* 95    A:   Hypoglycemia, resolved    P:   Monitor for sliding scale insulin protocol. Had one brief episode of hypoglycemia yesterday.  NEUROLOGIC A:   AMS P:   Elevated ammonia on 12/09/2017  Ammonia is only minimally elevated and does not likely explain this change in mental status. May need repeat CT scan of head. Will ask neurology to see the patient. I am check ing TSH, free T4   App cct 30 min  Micheal Likens MD [facility] Pager [phone] till 1 pm If no answer page [phone] 12/13/2017, 10:20 AM

## 2017-12-13 NOTE — Progress Notes (Signed)
Upon arrival to patient room this AM, noted that crash cart was sitting outside of patient's room.  After speaking with RN, patient's central line had come out and patient's blood pressure had decreased and patient was given epi.  Will hold daily SBT this AM due to patient's instability at this time.  Will continue to monitor.

## 2017-12-13 NOTE — Progress Notes (Signed)
[facility]  Date of Admission:  12/13/2017     Total days of antibiotics 7         ASSESSMENT/PLAN  Jacob Shannon continues on empiric therapy with concern for intra-abdominal infection. He continues to have elevated temperatures. Mental status remains unchanged from previous. Blood and respiratory cultures remain with no growth to date. Liver enzymes appear to be improving. There is concern for possible ESBL infection with continued fevers with 7 days of piperacillin-tazobactam.   1. Broaden coverage with change of piperacillin-tazobactam to meropenem. Continue current dose of vancomycin.  2. Will also add Eraxis for fungal coverage.  3. Continue to monitor cultures and narrow spectrum as able.    Principal Problem:   Lethargy Active Problems:   Essential hypertension   Elevated troponin   Chronic diastolic CHF (congestive heart failure), NYHA class 2 (HCC)   Chronic atrial fibrillation (HCC)   Chronic back pain   AKI (acute kidney injury) ([place])   Malnutrition of moderate degree   Pressure injury of skin   . sodium chloride   Intravenous Once  . chlorhexidine gluconate (MEDLINE KIT)  15 mL Mouth Rinse BID  . Chlorhexidine Gluconate Cloth  6 each Topical Daily  . docusate  100 mg Per Tube BID  . feeding supplement (PRO-STAT SUGAR FREE 64)  60 mL Per Tube BID  . fentaNYL (SUBLIMAZE) injection  50 mcg Intravenous Once  . insulin aspart  0-20 Units Subcutaneous Q4H  . ipratropium-albuterol  3 mL Nebulization TID  . lactulose  10 g Oral BID  . lidocaine  1 patch Transdermal Q24H  . mouth rinse  15 mL Mouth Rinse 10 times per day  . methylPREDNISolone (SOLU-MEDROL) injection  40 mg Intravenous Q12H  . rifaximin  550 mg Per Tube BID  . sodium chloride flush  10-40 mL Intracatheter Q12H  . sodium chloride flush  5 mL Intracatheter Q8H    SUBJECTIVE:  Temperatures have remain elevated in the last 24 hours with max temperature of 103.3. WBC count is  trending upwards at 11.9. Respiratory and blood cultures are without growth to date. Liver function test continue on downward trend. Bilirubin remains stable at 8.1. Central line accidentally removed last night and replaced. Drain output of about 135 ml in the last 24 hours. Has had increased 20% bands.  Allergies  Allergen Reactions  . Tramadol Nausea And Vomiting     Review of Systems: Review of Systems  Unable to perform ROS: Critical illness      OBJECTIVE: Vitals:   12/13/17 1215 12/13/17 1228 12/13/17 1230 12/13/17 1245  BP: (!) 112/51 (!) 106/59 (!) 109/56 (!) 103/58  Pulse: (!) 104 (!) 114 (!) 104 (!) 104  Resp: (!) 21 (!) '21 20 20  '$ Temp:      TempSrc:      SpO2: 100% 100% 100% 100%  Weight:      Height:       Body mass index is 30.5 kg/m.  Physical Exam  Constitutional: He is oriented to person, place, and time. He appears well-developed and well-nourished. He has a sickly appearance. He appears ill. No distress. He is intubated.  Cardiovascular: Normal heart sounds and intact distal pulses. An irregularly irregular rhythm present. Tachycardia present.  Right femoral central line patent, clean, dry and intact.   Pulmonary/Chest: Effort normal and breath sounds normal. He is intubated.  Abdominal:  Percutaneous drain present and patent with bile output  Neurological: He is oriented to  person, place, and time. He is unresponsive.  Skin: Skin is warm and dry.  Psychiatric: He has a normal mood and affect. His behavior is normal. Judgment and thought content normal.    Lab Results Lab Results  Component Value Date   WBC 11.9 (H) 12/13/2017   HGB 10.0 (L) 12/13/2017   HCT 34.8 (L) 12/13/2017   MCV 95.1 12/13/2017   PLT 52 (L) 12/13/2017    Lab Results  Component Value Date   CREATININE 1.20 12/13/2017   BUN 28 (H) 12/13/2017   NA 148 (H) 12/13/2017   K 3.5 12/13/2017   CL 115 (H) 12/13/2017   CO2 25 12/13/2017    Lab Results  Component Value Date    ALT 185 (H) 12/13/2017   AST 67 (H) 12/13/2017   ALKPHOS 47 12/13/2017   BILITOT 13.4 (H) 12/13/2017     Microbiology: Recent Results (from the past 240 hour(s))  Culture, Urine     Status: None   Collection Time: 12/07/17 10:25 AM  Result Value Ref Range Status   Specimen Description URINE, RANDOM  Final   Special Requests NONE  Final   Culture   Final    NO GROWTH Performed at [facility], [place] [address] [place], [place] [address]    Report Status 12/09/2017 FINAL  Final  Culture, blood (routine x 2)     Status: None   Collection Time: 12/07/17  3:29 PM  Result Value Ref Range Status   Specimen Description BLOOD RIGHT ANTECUBITAL  Final   Special Requests   Final    BOTTLES DRAWN AEROBIC AND ANAEROBIC Blood Culture adequate volume   Culture   Final    NO GROWTH 5 DAYS Performed at [facility], [place] [address] [place], [place] [address]    Report Status 12/12/2017 FINAL  Final  Culture, blood (Routine X 2) w Reflex to ID Panel     Status: None   Collection Time: 12/07/17  5:30 PM  Result Value Ref Range Status   Specimen Description BLOOD CENTRAL LINE  Final   Special Requests   Final    BOTTLES DRAWN AEROBIC AND ANAEROBIC Blood Culture adequate volume   Culture   Final    NO GROWTH 5 DAYS Performed at [facility], [place] [address] [place], [place] [address]    Report Status 12/12/2017 FINAL  Final  Culture, body fluid-bottle     Status: None (Preliminary result)   Collection Time: 12/09/17  3:53 PM  Result Value Ref Range Status   Specimen Description BILE  Final   Special Requests NONE  Final   Culture   Final    NO GROWTH 4 DAYS Performed at [facility] [address], [place], [place] [address]    Report Status PENDING  Incomplete  Gram stain     Status: None   Collection Time: 12/09/17  3:53 PM  Result Value Ref Range Status   Specimen Description BILE  Final   Special Requests NONE  Final   Gram Stain   Final    NO WBC  SEEN NO ORGANISMS SEEN Performed at [facility], [address] [place], [place] [address]    Report Status 12/09/2017 FINAL  Final  Culture, blood (routine x 2)     Status: None (Preliminary result)   Collection Time: 12/12/17  1:30 PM  Result Value Ref Range Status   Specimen Description BLOOD FEMORAL ARTERY RIGHT  Final  Special Requests   Final    BOTTLES DRAWN AEROBIC AND ANAEROBIC Blood Culture adequate volume   Culture   Final    NO GROWTH < 24 HOURS Performed at [facility], [place] [address] [place], [place] [address]    Report Status PENDING  Incomplete  Culture, blood (routine x 2)     Status: None (Preliminary result)   Collection Time: 12/12/17  1:45 PM  Result Value Ref Range Status   Specimen Description BLOOD FEMORAL ARTERY RIGHT  Final   Special Requests   Final    BOTTLES DRAWN AEROBIC AND ANAEROBIC Blood Culture adequate volume   Culture   Final    NO GROWTH < 24 HOURS Performed at [facility], [place] [address], [place], [place] [address]    Report Status PENDING  Incomplete  Culture, respiratory (NON-Expectorated)     Status: None (Preliminary result)   Collection Time: 12/12/17  4:40 PM  Result Value Ref Range Status   Specimen Description TRACHEAL ASPIRATE  Final   Special Requests NONE  Final   Gram Stain   Final    MODERATE WBC PRESENT,BOTH PMN AND MONONUCLEAR NO ORGANISMS SEEN    Culture   Final    NO GROWTH < 24 HOURS Performed at [facility], [place] [address] [place], [place] [address]    Report Status PENDING  Incomplete     Terri Piedra, [place] [facility] [place] Pager  12/13/2017  1:08 PM

## 2017-12-13 NOTE — Procedures (Signed)
Central Venous Catheter Insertion Procedure Note Jacob Shannon [id number] [birth year]  Procedure: Insertion of Central Venous Catheter Indications: Assessment of intravascular volume, Drug and/or fluid administration and Frequent blood sampling  Procedure Details Consent: Unable to obtain consent because of emergent medical necessity. Time Out: Verified patient identification, verified procedure, site/side was marked, verified correct patient position, special equipment/implants available, medications/allergies/relevent history reviewed, required imaging and test results available.  Performed  Maximum sterile technique was used including antiseptics, cap, gloves, gown, hand hygiene, mask and sheet. Skin prep: Chlorhexidine A antimicrobial bonded/coated triple lumen catheter was placed in the right femoral vein due to emergent situation using the Seldinger technique.  Line sutured, biopatch placed, and sterile dressing placed.   Evaluation Blood flow good Complications: No apparent complications Patient did tolerate procedure well. Chest X-ray ordered to verify placement.  CXR: n/a.  Procedure performed with ultrasound guidance for real time vessel cannulation.     Kennieth Rad, AGACNP-BC [facility] Pgr: [phone] or if no answer [phone] 12/13/2017, 6:50 AM

## 2017-12-13 NOTE — Progress Notes (Signed)
[facility] Progress Note  Jacob Shannon 77 y.o. Jul 23, 1940  CC:  Abnormal LFTs   Subjective:  This morning's events noted. Central line was displaced. He remains intubated. Discussed with the nursing staff. Having large amount of liquid stool in the rectal tube.   ROS :  Not able to obtained.   Objective: Vital signs in last 24 hours: Vitals:   12/13/17 0800 12/13/17 0815  BP: (!) 105/49 107/68  Pulse: (!) 107 (!) 117  Resp: (!) 21 (!) 22  Temp: 99.6 F (37.6 C)   SpO2: 100% 100%    Physical Exam:  General. Remains intubated. Lungs. Coarse breath sounds bilaterally Abdomen. Mildly distended, not able to appreciate tenderness, bowel sounds present. No peritoneal signs  Lab Results: Recent Labs    12/12/17 2127 12/13/17 0500  NA 149* 148*  K 3.6 3.5  CL 113* 115*  CO2 28 25  GLUCOSE 176* 195*  BUN 30* 28*  CREATININE 1.33* 1.20  CALCIUM 7.7* 7.6*  MG 1.7 2.0  PHOS 1.4* 2.9   Recent Labs    12/12/17 1645 12/13/17 0500  AST 130* 67*  ALT 245* 185*  ALKPHOS 49 47  BILITOT 13.8* 13.4*  PROT 5.5* 4.9*  ALBUMIN 1.9* 1.7*   Recent Labs    12/12/17 0308 12/13/17 0500  WBC 7.1 11.9*  NEUTROABS 6.0 11.0*  HGB 9.2* 10.0*  HCT 32.0* 34.8*  MCV 95.5 95.1  PLT 63* 52*   Recent Labs    12/12/17 0308 12/13/17 0656  LABPROT 23.2* 22.5*  INR 2.07 1.99      Assessment/Plan: - abnormal LFTs . AST was 1269 and ALTs 740 on 12/08/2017. Alkaline phosphatase is normal and T bili was 7.1. His AST and ALTs are trending down but total bilirubin is trending up. Finding could be from acute hepatitis, most likely ischemic in nature given history of atrial fibrillation and history of diastolic heart failure.Acute hepatitis panel negative on admission.acetaminophen level less than 10. ?? Ischemic hepatitis    - Images concerning for acute cholecystitis with HIDA scan showing nonvisualization of the gallbladder. Status post percutaneous cholecystostomy tube  placement. - coagulopathy. Improving. INR was more than 10 on 12/07/2017.  S/P vitamin K/ Praxbind. - history of atrial fibrillation. Was on Pradaxa  - History of CHF. - thrombocytopenia. He had normal platelet count on admission. ?? DIC, versus medication induced. - encephalopathic. Intubated for airway protection. ? Hepatic /metabolic. CT scan concerning for surface nodularity of the liver. ?  Cirrhosis.   Recommendations --------------------------- - LFTs slightly trending down. INR continues to trend down. CT scan yesterday showed only trace amount of ascites and possible pneumonia. ? Cirrhosis  - worsening thromocytopenia concerning for underlying DIC/medication induced. He had a normal platelet count on and prior to admission. - decrease lactulose because of multiple loose stools. Add rifaximin. - prognosis remains guarded. - GI will follow.   Otis Brace MD, [place] 12/13/2017, 10:02 AM  Contact #  [phone]

## 2017-12-14 DIAGNOSIS — I10 Essential (primary) hypertension: Secondary | ICD-10-CM

## 2017-12-14 DIAGNOSIS — R21 Rash and other nonspecific skin eruption: Secondary | ICD-10-CM

## 2017-12-14 DIAGNOSIS — I4891 Unspecified atrial fibrillation: Secondary | ICD-10-CM

## 2017-12-14 DIAGNOSIS — R6521 Severe sepsis with septic shock: Secondary | ICD-10-CM

## 2017-12-14 DIAGNOSIS — R748 Abnormal levels of other serum enzymes: Secondary | ICD-10-CM

## 2017-12-14 DIAGNOSIS — E440 Moderate protein-calorie malnutrition: Secondary | ICD-10-CM

## 2017-12-14 DIAGNOSIS — I482 Chronic atrial fibrillation: Secondary | ICD-10-CM

## 2017-12-14 DIAGNOSIS — L270 Generalized skin eruption due to drugs and medicaments taken internally: Secondary | ICD-10-CM

## 2017-12-14 DIAGNOSIS — A419 Sepsis, unspecified organism: Principal | ICD-10-CM

## 2017-12-14 DIAGNOSIS — N179 Acute kidney failure, unspecified: Secondary | ICD-10-CM

## 2017-12-14 DIAGNOSIS — M545 Low back pain: Secondary | ICD-10-CM

## 2017-12-14 DIAGNOSIS — G8929 Other chronic pain: Secondary | ICD-10-CM

## 2017-12-14 DIAGNOSIS — D696 Thrombocytopenia, unspecified: Secondary | ICD-10-CM

## 2017-12-14 DIAGNOSIS — J9601 Acute respiratory failure with hypoxia: Secondary | ICD-10-CM

## 2017-12-14 DIAGNOSIS — E870 Hyperosmolality and hypernatremia: Secondary | ICD-10-CM

## 2017-12-14 LAB — CBC WITH DIFFERENTIAL/PLATELET
BASOS PCT: 0 %
Basophils Absolute: 0 10*3/uL (ref 0.0–0.1)
Basophils Absolute: 0 10*3/uL (ref 0.0–0.1)
Basophils Relative: 0 %
EOS PCT: 0 %
Eosinophils Absolute: 0.2 10*3/uL (ref 0.0–0.7)
Eosinophils Absolute: 0 10*3/uL (ref 0.0–0.7)
Eosinophils Relative: 1 %
HEMATOCRIT: 34 % — AB (ref 39.0–52.0)
HEMATOCRIT: 32.9 % — AB (ref 39.0–52.0)
HEMOGLOBIN: 10 g/dL — AB (ref 13.0–17.0)
Hemoglobin: 9.4 g/dL — ABNORMAL LOW (ref 13.0–17.0)
LYMPHS ABS: 1.3 10*3/uL (ref 0.7–4.0)
LYMPHS ABS: 1.8 10*3/uL (ref 0.7–4.0)
Lymphocytes Relative: 6 %
Lymphocytes Relative: 9 %
MCH: 27.5 pg (ref 26.0–34.0)
MCH: 27.5 pg (ref 26.0–34.0)
MCHC: 29.4 g/dL — AB (ref 30.0–36.0)
MCHC: 28.6 g/dL — ABNORMAL LOW (ref 30.0–36.0)
MCV: 93.7 fL (ref 78.0–100.0)
MCV: 96.2 fL (ref 78.0–100.0)
MONO ABS: 0.6 10*3/uL (ref 0.1–1.0)
MONO ABS: 0.6 10*3/uL (ref 0.1–1.0)
MONOS PCT: 3 %
Monocytes Relative: 3 %
NEUTROS ABS: 18.1 10*3/uL — AB (ref 1.7–7.7)
Neutro Abs: 19.1 10*3/uL — ABNORMAL HIGH (ref 1.7–7.7)
Neutrophils Relative %: 90 %
Neutrophils Relative %: 88 %
PLATELETS: 51 10*3/uL — AB (ref 150–400)
Platelets: 54 10*3/uL — ABNORMAL LOW (ref 150–400)
RBC: 3.63 MIL/uL — AB (ref 4.22–5.81)
RBC: 3.42 MIL/uL — ABNORMAL LOW (ref 4.22–5.81)
RDW: 21.9 % — ABNORMAL HIGH (ref 11.5–15.5)
RDW: 22.1 % — AB (ref 11.5–15.5)
WBC Morphology: INCREASED
WBC Morphology: INCREASED
WBC: 21.2 10*3/uL — ABNORMAL HIGH (ref 4.0–10.5)
WBC: 20.5 10*3/uL — ABNORMAL HIGH (ref 4.0–10.5)

## 2017-12-14 LAB — BLOOD GAS, ARTERIAL
Acid-Base Excess: 0.4 mmol/L (ref 0.0–2.0)
BICARBONATE: 24.5 mmol/L (ref 20.0–28.0)
DRAWN BY: 511551
FIO2: 40
LHR: 10 {breaths}/min
MECHVT: 550 mL
O2 Saturation: 97.1 %
PEEP: 5 cmH2O
PO2 ART: 135 mmHg — AB (ref 83.0–108.0)
Patient temperature: 103
pCO2 arterial: 44.4 mmHg (ref 32.0–48.0)
pH, Arterial: 7.374 (ref 7.350–7.450)

## 2017-12-14 LAB — HEPATIC FUNCTION PANEL
ALBUMIN: 2.1 g/dL — AB (ref 3.5–5.0)
ALK PHOS: 43 U/L (ref 38–126)
ALK PHOS: 44 U/L (ref 38–126)
ALT: 113 U/L — AB (ref 0–44)
ALT: 93 U/L — ABNORMAL HIGH (ref 0–44)
AST: 35 U/L (ref 15–41)
AST: 29 U/L (ref 15–41)
Albumin: 2 g/dL — ABNORMAL LOW (ref 3.5–5.0)
BILIRUBIN INDIRECT: 4.2 mg/dL — AB (ref 0.3–0.9)
BILIRUBIN TOTAL: 13.3 mg/dL — AB (ref 0.3–1.2)
BILIRUBIN TOTAL: 10.3 mg/dL — AB (ref 0.3–1.2)
Bilirubin, Direct: 7.7 mg/dL — ABNORMAL HIGH (ref 0.0–0.2)
Bilirubin, Direct: 6.1 mg/dL — ABNORMAL HIGH (ref 0.0–0.2)
Indirect Bilirubin: 5.6 mg/dL — ABNORMAL HIGH (ref 0.3–0.9)
TOTAL PROTEIN: 5.2 g/dL — AB (ref 6.5–8.1)
TOTAL PROTEIN: 4 g/dL — AB (ref 6.5–8.1)

## 2017-12-14 LAB — BASIC METABOLIC PANEL
Anion gap: 7 (ref 5–15)
BUN: 39 mg/dL — ABNORMAL HIGH (ref 8–23)
CHLORIDE: 116 mmol/L — AB (ref 98–111)
CO2: 26 mmol/L (ref 22–32)
Calcium: 7.7 mg/dL — ABNORMAL LOW (ref 8.9–10.3)
Creatinine, Ser: 1.54 mg/dL — ABNORMAL HIGH (ref 0.61–1.24)
GFR calc non Af Amer: 41 mL/min — ABNORMAL LOW (ref >60)
GFR, EST AFRICAN AMERICAN: 48 mL/min — AB (ref >60)
Glucose, Bld: 146 mg/dL — ABNORMAL HIGH (ref 70–99)
POTASSIUM: 4.8 mmol/L (ref 3.5–5.1)
SODIUM: 149 mmol/L — AB (ref 135–145)

## 2017-12-14 LAB — PROCALCITONIN: Procalcitonin: 8.11 ng/mL

## 2017-12-14 LAB — URINALYSIS, ROUTINE W REFLEX MICROSCOPIC
GLUCOSE, UA: NEGATIVE mg/dL
Ketones, ur: 5 mg/dL — AB
LEUKOCYTES UA: NEGATIVE
NITRITE: NEGATIVE
PROTEIN: NEGATIVE mg/dL
Specific Gravity, Urine: 1.024 (ref 1.005–1.030)
pH: 5 (ref 5.0–8.0)

## 2017-12-14 LAB — PROTIME-INR
INR: 1.81
Prothrombin Time: 20.8 seconds — ABNORMAL HIGH (ref 11.4–15.2)

## 2017-12-14 LAB — HEPARIN LEVEL (UNFRACTIONATED)
Heparin Unfractionated: <0.1 IU/mL — ABNORMAL LOW (ref 0.30–0.70)
Heparin Unfractionated: <0.1 IU/mL — ABNORMAL LOW (ref 0.30–0.70)

## 2017-12-14 LAB — CULTURE, BODY FLUID W GRAM STAIN -BOTTLE: Culture: NO GROWTH

## 2017-12-14 LAB — GLUCOSE, CAPILLARY
GLUCOSE-CAPILLARY: 116 mg/dL — AB (ref 70–99)
GLUCOSE-CAPILLARY: 133 mg/dL — AB (ref 70–99)
GLUCOSE-CAPILLARY: 137 mg/dL — AB (ref 70–99)
Glucose-Capillary: 130 mg/dL — ABNORMAL HIGH (ref 70–99)
Glucose-Capillary: 135 mg/dL — ABNORMAL HIGH (ref 70–99)
Glucose-Capillary: 117 mg/dL — ABNORMAL HIGH (ref 70–99)

## 2017-12-14 LAB — MRSA PCR SCREENING: MRSA by PCR: NEGATIVE

## 2017-12-14 LAB — FIBRINOGEN: FIBRINOGEN: 296 mg/dL (ref 210–475)

## 2017-12-14 MED ORDER — FREE WATER
100.0000 mL | Status: DC
  Administered 2017-12-15 (×2): 100 mL

## 2017-12-14 MED ORDER — PANTOPRAZOLE SODIUM 40 MG PO PACK
40.0000 mg | PACK | Freq: Every day | ORAL | Status: DC
  Administered 2017-12-15: 40 mg
  Filled 2017-12-14: qty 20

## 2017-12-14 MED ORDER — DILTIAZEM HCL-DEXTROSE 100-5 MG/100ML-% IV SOLN (PREMIX)
5.0000 mg/h | INTRAVENOUS | Status: DC
  Administered 2017-12-14: 5 mg/h via INTRAVENOUS
  Filled 2017-12-14 (×2): qty 100

## 2017-12-14 MED ORDER — IPRATROPIUM-ALBUTEROL 0.5-2.5 (3) MG/3ML IN SOLN
3.0000 mL | RESPIRATORY_TRACT | Status: DC | PRN
  Administered 2017-12-15: 3 mL via RESPIRATORY_TRACT

## 2017-12-14 MED ORDER — LACTATED RINGERS IV SOLN
INTRAVENOUS | Status: DC
  Administered 2017-12-14: 23:00:00 via INTRAVENOUS

## 2017-12-14 MED ORDER — METOPROLOL TARTRATE 25 MG/10 ML ORAL SUSPENSION
25.0000 mg | Freq: Two times a day (BID) | ORAL | Status: DC
  Administered 2017-12-14 – 2017-12-15 (×3): 25 mg
  Filled 2017-12-14 (×3): qty 10

## 2017-12-14 NOTE — Progress Notes (Signed)
Writer about to turn patient to change soiled pad, when central line came out. Pressure applied to the site to stop bleeding. E-Link paged to notify. Patient on Levo for low blood pressure and does not have peripheral line at this time.

## 2017-12-14 NOTE — Progress Notes (Signed)
[facility] Progress Note  Subjective: Patient seen in follow-up today for elevated liver enzymes, suspected ischemic hepatitis, suspected cirrhosis, and hepatic encephalopathy.  Patient remains intubated and sedated.  Not responding at this time.  He is on lactulose and Xifaxan.  Objective: Vital signs in last 24 hours: Temp:  [100.3 F (37.9 C)-104 F (40 C)] 100.5 F (38.1 C) (06/29 0800) Pulse Rate:  [98-153] 129 (06/29 0811) Resp:  [12-23] 18 (06/29 0811) BP: (91-140)/(48-82) 140/82 (06/29 0811) SpO2:  [97 %-100 %] 100 % (06/29 0811) FiO2 (%):  [40 %] 40 % (06/29 0811) Weight:  [91 kg (200 lb 9.9 oz)] 91 kg (200 lb 9.9 oz) (06/28 1030) Weight change: 0 kg (0 lb)   PE:  Intubated, sedated,  Heart regular rhythm  Abdomen nontender not distended  Lab Results: Results for orders placed or performed during the hospital encounter of 12/08/2017 (from the past 24 hour(s))  Hemoglobin A1c     Status: Abnormal   Collection Time: 12/13/17 10:48 AM  Result Value Ref Range   Hgb A1c MFr Bld 6.0 (H) 4.8 - 5.6 %   Mean Plasma Glucose 125.5 mg/dL  Ammonia     Status: Abnormal   Collection Time: 12/13/17 11:00 AM  Result Value Ref Range   Ammonia 62 (H) 9 - 35 umol/L  Glucose, capillary     Status: Abnormal   Collection Time: 12/13/17 11:41 AM  Result Value Ref Range   Glucose-Capillary 143 (H) 70 - 99 mg/dL  Glucose, capillary     Status: Abnormal   Collection Time: 12/13/17  3:44 PM  Result Value Ref Range   Glucose-Capillary 122 (H) 70 - 99 mg/dL  Hepatic function panel     Status: Abnormal   Collection Time: 12/13/17  5:15 PM  Result Value Ref Range   Total Protein 5.0 (L) 6.5 - 8.1 g/dL   Albumin 1.9 (L) 3.5 - 5.0 g/dL   AST 45 (H) 15 - 41 U/L   ALT 146 (H) 0 - 44 U/L   Alkaline Phosphatase 49 38 - 126 U/L   Total Bilirubin 12.9 (H) 0.3 - 1.2 mg/dL   Bilirubin, Direct 7.7 (H) 0.0 - 0.2 mg/dL   Indirect Bilirubin 5.2 (H) 0.3 - 0.9 mg/dL  Triglycerides      Status: None   Collection Time: 12/13/17  5:15 PM  Result Value Ref Range   Triglycerides 82 <150 mg/dL  Magnesium     Status: None   Collection Time: 12/13/17  5:15 PM  Result Value Ref Range   Magnesium 1.9 1.7 - 2.4 mg/dL  Phosphorus     Status: Abnormal   Collection Time: 12/13/17  5:15 PM  Result Value Ref Range   Phosphorus 1.6 (L) 2.5 - 4.6 mg/dL  Glucose, capillary     Status: Abnormal   Collection Time: 12/13/17  8:01 PM  Result Value Ref Range   Glucose-Capillary 122 (H) 70 - 99 mg/dL   Comment 1 Notify RN    Comment 2 Document in Chart   Heparin level (unfractionated)     Status: Abnormal   Collection Time: 12/13/17  8:13 PM  Result Value Ref Range   Heparin Unfractionated <0.10 (L) 0.30 - 0.70 IU/mL  Glucose, capillary     Status: Abnormal   Collection Time: 12/13/17 11:16 PM  Result Value Ref Range   Glucose-Capillary 142 (H) 70 - 99 mg/dL   Comment 1 Notify RN    Comment 2 Document in Chart   Glucose,  capillary     Status: Abnormal   Collection Time: 12/14/17  3:45 AM  Result Value Ref Range   Glucose-Capillary 133 (H) 70 - 99 mg/dL  Blood gas, arterial     Status: Abnormal   Collection Time: 12/14/17  4:05 AM  Result Value Ref Range   FIO2 40.00    Delivery systems VENTILATOR    Mode PRESSURE REGULATED VOLUME CONTROL    VT 550 mL   LHR 10 resp/min   Peep/cpap 5.0 cm H20   pH, Arterial 7.374 7.350 - 7.450   pCO2 arterial 44.4 32.0 - 48.0 mmHg   pO2, Arterial 135 (H) 83.0 - 108.0 mmHg   Bicarbonate 24.5 20.0 - 28.0 mmol/L   Acid-Base Excess 0.4 0.0 - 2.0 mmol/L   O2 Saturation 97.1 %   Patient temperature 103.0    Collection site LEFT RADIAL    Drawn by [id number]    Sample type ARTERIAL    Allens test (pass/fail) PASS PASS  Basic metabolic panel     Status: Abnormal   Collection Time: 12/14/17  5:00 AM  Result Value Ref Range   Sodium 149 (H) 135 - 145 mmol/L   Potassium 4.8 3.5 - 5.1 mmol/L   Chloride 116 (H) 98 - 111 mmol/L   CO2 26 22 - 32 mmol/L    Glucose, Bld 146 (H) 70 - 99 mg/dL   BUN 39 (H) 8 - 23 mg/dL   Creatinine, Ser 1.54 (H) 0.61 - 1.24 mg/dL   Calcium 7.7 (L) 8.9 - 10.3 mg/dL   GFR calc non Af Amer 41 (L) >60 mL/min   GFR calc Af Amer 48 (L) >60 mL/min   Anion gap 7 5 - 15  Hepatic function panel     Status: Abnormal   Collection Time: 12/14/17  5:00 AM  Result Value Ref Range   Total Protein 5.2 (L) 6.5 - 8.1 g/dL   Albumin 2.1 (L) 3.5 - 5.0 g/dL   AST 35 15 - 41 U/L   ALT 113 (H) 0 - 44 U/L   Alkaline Phosphatase 43 38 - 126 U/L   Total Bilirubin 13.3 (H) 0.3 - 1.2 mg/dL   Bilirubin, Direct 7.7 (H) 0.0 - 0.2 mg/dL   Indirect Bilirubin 5.6 (H) 0.3 - 0.9 mg/dL  CBC with Differential/Platelet     Status: Abnormal   Collection Time: 12/14/17  5:00 AM  Result Value Ref Range   WBC 21.2 (H) 4.0 - 10.5 K/uL   RBC 3.63 (L) 4.22 - 5.81 MIL/uL   Hemoglobin 10.0 (L) 13.0 - 17.0 g/dL   HCT 34.0 (L) 39.0 - 52.0 %   MCV 93.7 78.0 - 100.0 fL   MCH 27.5 26.0 - 34.0 pg   MCHC 29.4 (L) 30.0 - 36.0 g/dL   RDW 21.9 (H) 11.5 - 15.5 %   Platelets 54 (L) 150 - 400 K/uL   Neutrophils Relative % 90 %   Lymphocytes Relative 6 %   Monocytes Relative 3 %   Eosinophils Relative 1 %   Basophils Relative 0 %   Neutro Abs 19.1 (H) 1.7 - 7.7 K/uL   Lymphs Abs 1.3 0.7 - 4.0 K/uL   Monocytes Absolute 0.6 0.1 - 1.0 K/uL   Eosinophils Absolute 0.2 0.0 - 0.7 K/uL   Basophils Absolute 0.0 0.0 - 0.1 K/uL   RBC Morphology POLYCHROMASIA PRESENT    WBC Morphology INCREASED BANDS (>20% BANDS)   Heparin level (unfractionated)     Status: Abnormal   Collection  Time: 12/14/17  5:00 AM  Result Value Ref Range   Heparin Unfractionated <0.10 (L) 0.30 - 0.70 IU/mL  Glucose, capillary     Status: Abnormal   Collection Time: 12/14/17  7:16 AM  Result Value Ref Range   Glucose-Capillary 130 (H) 70 - 99 mg/dL    Studies/Results: Dg Chest Port 1 View  Result Date: 12/13/2017 CLINICAL DATA:  Acute respiratory failure. EXAM: PORTABLE CHEST 1 VIEW  COMPARISON:  12/11/2017 and prior exams FINDINGS: Endotracheal tube with tip 3.5 cm above the carina and NG tube with tip off the field of view again noted. LEFT IJ central venous catheter has been removed. Cardiomegaly with pulmonary vascular congestion, bilateral LOWER lung opacities and RIGHT pleural effusion again noted. There is no evidence of pneumothorax. IMPRESSION: Removal of LEFT central venous catheter, otherwise unchanged appearance of the chest with cardiomegaly, pulmonary vascular congestion, bilateral LOWER lung opacities and RIGHT pleural effusion. Electronically Signed   By: Margarette Canada M.D.   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  Continue lactulose.  Continue Xifaxan.    Cassell Clement 12/14/2017, 8:48 AM  Pager: [phone] If no answer or after 5 PM call [phone]

## 2017-12-14 NOTE — Progress Notes (Signed)
Subjective: Patient is intubated   Antibiotics:  Anti-infectives (From admission, onward)   Start     Dose/Rate Route Frequency Ordered Stop   12/14/17 1800  anidulafungin (ERAXIS) 100 mg in sodium chloride 0.9 % 100 mL IVPB     100 mg 78 mL/hr over 100 Minutes Intravenous Every 24 hours 12/13/17 1625     12/13/17 1800  meropenem (MERREM) 1 g in sodium chloride 0.9 % 100 mL IVPB     1 g 200 mL/hr over 30 Minutes Intravenous Every 8 hours 12/13/17 1625     12/13/17 1800  anidulafungin (ERAXIS) 200 mg in sodium chloride 0.9 % 200 mL IVPB     200 mg 78 mL/hr over 200 Minutes Intravenous  Once 12/13/17 1625 12/13/17 2108   12/13/17 1015  rifaximin (XIFAXAN) tablet 550 mg  Status:  Discontinued     550 mg Oral 2 times daily 12/13/17 1002 12/13/17 1004   12/13/17 1015  rifaximin (XIFAXAN) tablet 550 mg     550 mg Per Tube 2 times daily 12/13/17 1004     12/12/17 2100  vancomycin (VANCOCIN) IVPB 750 mg/150 ml premix     750 mg 150 mL/hr over 60 Minutes Intravenous Every 12 hours 12/12/17 0822     12/12/17 0815  vancomycin (VANCOCIN) 2,000 mg in sodium chloride 0.9 % 500 mL IVPB     2,000 mg 250 mL/hr over 120 Minutes Intravenous  Once 12/12/17 0811 12/12/17 1038   12/08/17 0230  piperacillin-tazobactam (ZOSYN) IVPB 3.375 g  Status:  Discontinued     3.375 g 12.5 mL/hr over 240 Minutes Intravenous Every 8 hours 12/08/17 0227 12/13/17 1616   12/07/17 1100  vancomycin (VANCOCIN) IVPB 1000 mg/200 mL premix     1,000 mg 200 mL/hr over 60 Minutes Intravenous  Once 12/07/17 1025 12/07/17 1337   12/07/17 1100  piperacillin-tazobactam (ZOSYN) IVPB 3.375 g     3.375 g 12.5 mL/hr over 240 Minutes Intravenous  Once 12/07/17 1025 12/07/17 1833      Medications: Scheduled Meds: . sodium chloride   Intravenous Once  . chlorhexidine gluconate (MEDLINE KIT)  15 mL Mouth Rinse BID  . Chlorhexidine Gluconate Cloth  6 each Topical Daily  . docusate  100 mg Per Tube BID  . feeding  supplement (PRO-STAT SUGAR FREE 64)  60 mL Per Tube BID  . fentaNYL (SUBLIMAZE) injection  50 mcg Intravenous Once  . insulin aspart  0-20 Units Subcutaneous Q4H  . ipratropium-albuterol  3 mL Nebulization TID  . lactulose  10 g Oral BID  . lidocaine  1 patch Transdermal Q24H  . mouth rinse  15 mL Mouth Rinse 10 times per day  . methylPREDNISolone (SOLU-MEDROL) injection  40 mg Intravenous Q12H  . metoprolol tartrate  25 mg Per Tube BID  . rifaximin  550 mg Per Tube BID  . sodium chloride flush  10-40 mL Intracatheter Q12H  . sodium chloride flush  5 mL Intracatheter Q8H   Continuous Infusions: . sodium chloride 10 mL/hr at 12/14/17 0600  . albumin human 25 g (12/14/17 0921)  . amiodarone 30 mg/hr (12/14/17 0905)  . anidulafungin    . dextrose 5 % lactated ringers with kcl 125 mL/hr at 12/14/17 1030  . diltiazem (CARDIZEM) infusion    . famotidine (PEPCID) IV 20 mg (12/14/17 0917)  . feeding supplement (VITAL AF 1.2 CAL) 45 mL/hr at 12/14/17 0600  . fentaNYL infusion INTRAVENOUS 30 mcg/hr (12/14/17 1300)  . heparin  1,500 Units/hr (12/14/17 1100)  . meropenem (MERREM) IV 1 g (12/14/17 1036)  . norepinephrine (LEVOPHED) Adult infusion 14 mcg/min (12/14/17 1227)  . vancomycin 750 mg (12/14/17 0754)   PRN Meds:.sodium chloride, acetaminophen (TYLENOL) oral liquid 160 mg/5 mL, fentaNYL, ibuprofen, midazolam, midazolam, sodium chloride flush    Objective: Weight change: 0 lb (0 kg)  Intake/Output Summary (Last 24 hours) at 12/14/2017 1411 Last data filed at 12/14/2017 1300 Gross per 24 hour  Intake 7214.78 ml  Output 2100 ml  Net 5114.78 ml   Blood pressure 124/80, pulse (!) 104, temperature (!) 100.8 F (38.2 C), temperature source Axillary, resp. rate 15, height _0  (1.727 m), weight 200 lb 9.9 oz (91 kg), SpO2 100 %. Temp:  [100.3 F (37.9 C)-104 F (40 C)] 100.8 F (38.2 C) (06/29 1200) Pulse Rate:  [104-153] 104 (06/29 1300) Resp:  [11-23] 15 (06/29 1300) BP:  (91-150)/(50-84) 124/80 (06/29 1300) SpO2:  [97 %-100 %] 100 % (06/29 1300) FiO2 (%):  [40 %] 40 % (06/29 1117)  Physical Exam: General: Intubated and sedated HEENT: anicteric sclera, EOMI CVS tachycardic rate normal  Chest: , Few rhonchi  abdomen: soft, questionable distended shin  extremities: Edematous  skin: Drain in place with bilious material Neuro: nonfocal  CBC:    BMET Recent Labs    12/13/17 0500 12/14/17 0500  NA 148* 149*  K 3.5 4.8  CL 115* 116*  CO2 25 26  GLUCOSE 195* 146*  BUN 28* 39*  CREATININE 1.20 1.54*  CALCIUM 7.6* 7.7*     Liver Panel  Recent Labs    12/13/17 1715 12/14/17 0500  PROT 5.0* 5.2*  ALBUMIN 1.9* 2.1*  AST 45* 35  ALT 146* 113*  ALKPHOS 49 43  BILITOT 12.9* 13.3*  BILIDIR 7.7* 7.7*  IBILI 5.2* 5.6*       Sedimentation Rate No results for input(s): ESRSEDRATE in the last 72 hours. C-Reactive Protein No results for input(s): CRP in the last 72 hours.  Micro Results: Recent Results (from the past 720 hour(s))  Culture, Urine     Status: None   Collection Time: 12/07/17 10:25 AM  Result Value Ref Range Status   Specimen Description URINE, RANDOM  Final   Special Requests NONE  Final   Culture   Final    NO GROWTH Performed at [facility], [address], [place], [place] [address]    Report Status 12/09/2017 FINAL  Final  Culture, blood (routine x 2)     Status: None   Collection Time: 12/07/17  3:29 PM  Result Value Ref Range Status   Specimen Description BLOOD RIGHT ANTECUBITAL  Final   Special Requests   Final    BOTTLES DRAWN AEROBIC AND ANAEROBIC Blood Culture adequate volume   Culture   Final    NO GROWTH 5 DAYS Performed at [facility], [place] [address] [place], [place] [address]    Report Status 12/12/2017 FINAL  Final  Culture, blood (Routine X 2) w Reflex to ID Panel     Status: None   Collection Time: 12/07/17  5:30 PM  Result Value Ref Range Status   Specimen Description BLOOD  CENTRAL LINE  Final   Special Requests   Final    BOTTLES DRAWN AEROBIC AND ANAEROBIC Blood Culture adequate volume   Culture   Final    NO GROWTH 5 DAYS Performed at [facility], [place] [address] [place], [place] [address]    Report  Status 12/12/2017 FINAL  Final  Culture, body fluid-bottle     Status: None   Collection Time: 12/09/17  3:53 PM  Result Value Ref Range Status   Specimen Description BILE  Final   Special Requests NONE  Final   Culture   Final    NO GROWTH 5 DAYS Performed at [facility], [address] [place], [place] [address]    Report Status 12/14/2017 FINAL  Final  Gram stain     Status: None   Collection Time: 12/09/17  3:53 PM  Result Value Ref Range Status   Specimen Description BILE  Final   Special Requests NONE  Final   Gram Stain   Final    NO WBC SEEN NO ORGANISMS SEEN Performed at [facility], [place] [address], [place], [place] [address]    Report Status 12/09/2017 FINAL  Final  Culture, blood (routine x 2)     Status: None (Preliminary result)   Collection Time: 12/12/17  1:30 PM  Result Value Ref Range Status   Specimen Description BLOOD FEMORAL ARTERY RIGHT  Final   Special Requests   Final    BOTTLES DRAWN AEROBIC AND ANAEROBIC Blood Culture adequate volume   Culture   Final    NO GROWTH 2 DAYS Performed at [facility], [place] [address] [place], [place] [address]    Report Status PENDING  Incomplete  Culture, blood (routine x 2)     Status: None (Preliminary result)   Collection Time: 12/12/17  1:45 PM  Result Value Ref Range Status   Specimen Description BLOOD FEMORAL ARTERY RIGHT  Final   Special Requests   Final    BOTTLES DRAWN AEROBIC AND ANAEROBIC Blood Culture adequate volume   Culture   Final    NO GROWTH 2 DAYS Performed at [facility], [place] [address], [place], [place] [address]    Report Status PENDING  Incomplete  Culture, respiratory (NON-Expectorated)     Status: None (Preliminary result)     Collection Time: 12/12/17  4:40 PM  Result Value Ref Range Status   Specimen Description TRACHEAL ASPIRATE  Final   Special Requests NONE  Final   Gram Stain   Final    MODERATE WBC PRESENT,BOTH PMN AND MONONUCLEAR NO ORGANISMS SEEN    Culture   Final    CULTURE REINCUBATED FOR BETTER GROWTH Performed at [facility], [place] [address] [place], [place] [address]    Report Status PENDING  Incomplete    Studies/Results: Dg Chest Port 1 View  Result Date: 12/13/2017 CLINICAL DATA:  Acute respiratory failure. EXAM: PORTABLE CHEST 1 VIEW COMPARISON:  12/11/2017 and prior exams FINDINGS: Endotracheal tube with tip 3.5 cm above the carina and NG tube with tip off the field of view again noted. LEFT IJ central venous catheter has been removed. Cardiomegaly with pulmonary vascular congestion, bilateral LOWER lung opacities and RIGHT pleural effusion again noted. There is no evidence of pneumothorax. IMPRESSION: Removal of LEFT central venous catheter, otherwise unchanged appearance of the chest with cardiomegaly, pulmonary vascular congestion, bilateral LOWER lung opacities and RIGHT pleural effusion. Electronically Signed   By: Margarette Canada M.D.   On: 12/13/2017 11:03      Assessment/Plan:  INTERVAL HISTORY: Patient remains febrile despite quite broad-spectrum antimicrobials.   Principal Problem:   Lethargy Active Problems:   Essential hypertension   Elevated troponin   Chronic diastolic CHF (congestive heart failure), NYHA class 2 (HCC)  Chronic atrial fibrillation (HCC)   Chronic back pain   AKI (acute kidney injury) ([place])   Malnutrition of moderate degree   Pressure injury of skin    Jacob Shannon is a 77 y.o. male with recent admission for sepsis due to cholangitis now status post hepatic drain but still high fevers leukocytosis and need for pressors despite quite broad-spectrum antibiotics currently on vancomycin meropenem and Eraxis.  He had a CT scan of the abdomen  and pelvis performed on 27 June which is now 2 days ago which did not show any acute intra-abdominal process that would require intervention or explain his fevers or worsening clinical status.  #1 fevers and sepsis of unknown cause: He does have pleural effusions which one could try to drain under interventional radiology but he is currently on pressor support.  I will leave him on broad-spectrum antibiotics for now   LOS: 8 days   Alcide Evener 12/14/2017, 2:11 PM

## 2017-12-14 NOTE — Progress Notes (Signed)
PULMONARY / CRITICAL CARE MEDICINE   Name: Jacob Shannon MRN: [id number] DOB: 11/06/1940    ADMISSION DATE:  12/03/2017 CONSULTATION DATE:  12/07/2017  REFERRING MD:  [facility] Dr. Myna Hidalgo  CHIEF COMPLAINT:  Hypoxic/lethargic   Brief HPI:   Jacob Shannon is a 77 y.o. male with new onset altered mental status who was admitted with A-fib RVR and required diltiazem infusion to control. Over the last 24-48hrs has become more lethargic and also complaining of gastric discomfort and pain. Laboratory assessment has been an issue as he has vasculitis and poor access. Additionally, the patient has a worsening renal function with metabolic acidosis on recent ABG.  6/26 The patient got prregressively more obtunded and required intubation yesterday afternoon. He is intubated and on propofl for sedation.  6/27 The a patient is a little hypotensive this AM. His BP is running 89-38 systolic. His sodium is up a bit. He had been getting dextrose overnight. He remaisn quite obtunded. His jaundice appears a bit worse a swell. His renal fn ahs improved overnight.  6/29 The patient appears to be making a little progress in some respects. Unfortunately, he is stiill having some temperature spikes generally in the evening to to >101. Antiboitic therapy changed to Merrem from Zosyn and Eraxis added by ID. It appears that wew will be able to decrease his pressor requirements presenlty. That may help with his heart rate that at times has gone up to the 130-150 range.Marland Kitchen    VITAL SIGNS: BP (!) 145/84   Pulse (!) 140   Temp (!) 100.5 F (38.1 C) (Axillary)   Resp 16   Ht '5\' 8"'$  (1.727 m)   Wt 200 lb 9.9 oz (91 kg)   SpO2 100%   BMI 30.50 kg/m   HEMODYNAMICS:    INTAKE / OUTPUT: I/O last 3 completed shifts: In: 9030.3 [I.V.:6632.7; Other:10; NG/GT:1215; IV Piggyback:1172.7] Out: 3385 [Urine:2450; Drains:435; Stool:500]  PHYSICAL EXAMINATION: General: Frail elderly male poorly responsive. Quite  jaundiced HEENT: Oral mucosa is dry, no JVD lymphadenopathy is appreciated.  Pupils equal reactive asand 63m. He is now intubated Neuro:patient opened eyes and looked around transiently today when called which was an improvement CV: Heart sounds are regular atrial fibrillation with controlled ventricular rate of 93 PULM: Decreased breath sounds at bases, periods of apnea, respiratory his respirations at times. GI: Tender to touch, right percutaneous drain right outer quadrant noted. Loose bm . Patient had [facility] in place. Drainage of bilious materal fro JP in gallbladder. Extremities: warm/dry, positive edema, bilateral bunions are noted Skin: no rashes or lesions   LABS:  BMET Recent Labs  Lab 12/12/17 2127 12/13/17 0500 12/14/17 0500  NA 149* 148* 149*  K 3.6 3.5 4.8  CL 113* 115* 116*  CO2 '28 25 26  '$ BUN 30* 28* 39*  CREATININE 1.33* 1.20 1.54*  GLUCOSE 176* 195* 146*    Electrolytes Recent Labs  Lab 12/12/17 2127 12/13/17 0500 12/13/17 1715 12/14/17 0500  CALCIUM 7.7* 7.6*  --  7.7*  MG 1.7 2.0 1.9  --   PHOS 1.4* 2.9 1.6*  --     CBC Recent Labs  Lab 12/12/17 0308 12/13/17 0500 12/14/17 0500  WBC 7.1 11.9* 21.2*  HGB 9.2* 10.0* 10.0*  HCT 32.0* 34.8* 34.0*  PLT 63* 52* 54*    Coag's Recent Labs  Lab 12/08/17 0920  12/08/17 1500  12/09/17 0407  12/11/17 0523 12/12/17 0308 12/13/17 0656  APTT 81*  --  86*  --  52*  --   --   --   --  INR 7.71*   < > 7.37*   < > 4.38*   < > 2.48 2.07 1.99   < > = values in this interval not displayed.    Sepsis Markers Recent Labs  Lab 12/08/17 0000  12/08/17 1728 12/09/17 0407 12/12/17 0308 12/13/17 0500  LATICACIDVEN 9.9*  --  2.5* 2.1*  --   --   PROCALCITON  --    < >  --  2.11 1.27 3.46   < > = values in this interval not displayed.    ABG Recent Labs  Lab 12/12/17 0853 12/13/17 0534 12/14/17 0405  PHART 7.467* 7.423 7.374  PCO2ART 42.3 33.4 44.4  PO2ART 94.9 126.0* 135*    Liver  Enzymes Recent Labs  Lab 12/13/17 0500 12/13/17 1715 12/14/17 0500  AST 67* 45* 35  ALT 185* 146* 113*  ALKPHOS 47 49 43  BILITOT 13.4* 12.9* 13.3*  ALBUMIN 1.7* 1.9* 2.1*    Cardiac Enzymes Recent Labs  Lab 12/07/17 1733 12/08/17 1700  TROPONINI 0.18* 0.12*    Glucose Recent Labs  Lab 12/13/17 1544 12/13/17 2001 12/13/17 2316 12/14/17 0345 12/14/17 0716 12/14/17 1144  GLUCAP 122* 122* 142* 133* 130* 135*    Imaging No results found.   STUDIES:  CTH 6/21>>NAICA, old infarct CT A/P 6/22>>Mild ascites with cholelithiasis and possible sludge  HIDA 6/23>>1. Nonvisualization of the gallbladder and small bowel with no evidence of radiotracer within the common bile duct. Persistent activity noted throughout the liver. Findings support hepatocellular dysfunction, with depressed hepatic excretion of bile. 2. Acute cholecystitis is not excluded.   CULTURES: BCx 6/22>>NGTD 12/09/2017 percutaneous drainage fluid>>  ANTIBIOTICS: Vancomycin 6/21>>6/22 Zosyn 6/21>>>  SIGNIFICANT EVENTS: -INR elevated >10 and treated with Vit K/Praxbind. Repeat 3>>>7 -LFTs continue to worsen  -IR placed right Foley drain 12/09/2017  LINES/TUBES: CVC RIJ 6/22>>  DISCUSSION: Patient presented with biliary sepsis several days ago. Had a choley tube placed and was started on abx. Course since complicated by acute respiratory failure, rapid atrial fibn and septic shock. ASSESSMENT / PLAN:  PULMONARY A: Minimally hypoxic  Suspected underlying OSA  Patient remains on FI02 of 40% His CXR shows evidence of bilteral lower lobe infiltrates. Culture was negative.  CARDIOVASCULAR  Patient in rapid Atrial fib. He is on amiodarone drip. I am adding small dose of po metoprolol and in the interim adding a cardizem drip. In the interim, we will endeavor to wean down the levophed.        RENAL Lab Results  Component Value Date   CREATININE 1.54 (H) 12/14/2017   CREATININE 1.20  12/13/2017   CREATININE 1.33 (H) 12/12/2017   CREATININE 0.99 09/09/2015   Recent Labs  Lab 12/12/17 2127 12/13/17 0500 12/14/17 0500  K 3.6 3.5 4.8   Renal fn. Is a little worse today. The patient is getting a fair amount of fluid still. The recent shock has apparently contributed to a bit of AKI.         GASTROINTESTINAL A:   Acute liver failure, unclear etiology  Cholecystitis  P:   Percutaneous drain placed on 12/09/2017. His bili continues toi climb daily. Alk phos stable. I am going to ask Gi  To weigh in on his conftion. I would like to send him for another CT scan but would prefer to do do so when his creatinine has improved some. I am getting CT abdo and pelvis today. His total bili has been climbing thepast few days despite his recent cholecystostomy. His INR is  down to 2 a this time. It appaears that his bili has peaked and may be oin it's way down. His ammmoniua level was a little elevated at 62 yesterday. Will continue lactulose.   HEMATOLOGIC Lab Results  Component Value Date   INR 1.99 12/13/2017   INR 2.07 12/12/2017   INR 2.48 12/11/2017    A:   Coagulopathy  P:  Status post FFP infusion for IR to place drain Status post vitamin K infusion Continue to monitor INR   INFECTIOUS A:   Cholecystitis  P:   Percutaneous drain placed per IR on 12/09/2017 Continue n.p.o. Continue antimicrobial therapy  ABX coverage changed as noted to Vanco, Merrem and Eraxis.  ENDOCRINE CBG (last 3)  Recent Labs    12/14/17 0345 12/14/17 0716 12/14/17 1144  GLUCAP 133* 130* 135*    A:   Hypoglycemia, resolved    P:   Monitor for sliding scale insulin protocol. Had one brief episode of hypoglycemia yesterday.  NEUROLOGIC  Appears tombe slowly incrementalkly impproving. Opened his eyes and looked around this AM.      Jacob Likens MD [facility] Pager [phone] till 1 pm If no answer page [phone] 12/14/2017, 11:58 AM

## 2017-12-14 NOTE — Progress Notes (Signed)
I was called to bedside to evaluate patient with rash. On my exam patient has petechial rash on chest and torso, with more diffuse erythema on scalp, face, back/flank, and inguinal area. 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 Have asked pharmacy to re-order next dose of Vancomycin to run it in slow. The petechial component of the rash is very pronounced though, and I am concerned patient could be going into DIC, especially with lab evidence of developing thrombocytopenia. Will check stat CBC, INR, and Fibrinogen. This rash does NOT look like a pure viral exanthem from his fever. It also does not look like staph scalded skin syndrome or heparin skin necrosis. There are no papular or bullous lesions; there is one tiny area of purpura vs ecchymosis on right inner upper lip abutting the gum although not clear if this is trauma-induced. The remainder of oral exam is limited due to ETT and OG but but no lesions on lips or visible lesions on tongue or other oral mucosa. Therefore, I am doubtful of this being stevens johnson syndrome.   Regarding the Thrombocytopenia itself, will change H2 blocker to PPI to minimize side effect of worsening thrombocytopenia. Follow-up DIC labs. If they are negative, may need to rule out HIT. Plt fell from 175 on 6/21 to a low of 52 on 6/28; 54 on 6/29. However on review of MAR doesn't appear he began receiving any heparin until 6/28, making HIT much less likely.   Regarding his septic shock, he continues to have high fevers despite tylenol q6hrs PRN. Will add cooling blanket. He is being treated for acute cholecystitis (for which IR has placed drain, which is draining well on my exam; also leaking around the drain). CXR shows he also has BLL infiltrates consistent with pneumonia. However all cultures remain no growth. Blood culture (6/29): pending Blood culture (6/27): no growth Sputum culture (6/27): no growth MRSA nares: negative Gallbladder drain culture (6/24): no  growth  On exam he has a new Right femoral CVC that was placed 6/28; No other lines besides 1 PIV. He does have profuse foul smelling diarrhea, which is likely due to the tube feeds, lactulose, and antibiotics he is receiving. But given the continued fever, worsening leukocytosis, and worsening procalcitonin, will check stool for Cdiff. Currently antibiotic regimen includes Vanc, Mero, and Eraxis. If this workup remains negative, may consider if patient could have a retained stone and need ERCP?? Defer to GI on this.   Review of his labs show hypernatremia (Na 149) and mild hyperkalemia (K 4.8). He is receiving D5LR with K at 125cc/hr. Will change to plain LR @ 125cc/hr as he no longer needs the potassium repletion. Will add free water flushes (currently not receiving any) of 100cc q4hrs. Recheck Sodium in the AM.   Regarding his Afib with RVR, he remains on Amiodarone infusion, Diltiazem infusion, and Metoprolol 25mg  PO BID. I am doubtful that the metoprolol is helping that much. If HR continues to be difficult to control, recommend increasing diltiazem infusion. Keep K and Mg repleted. Adequate sedation. Monitor fluid balance and UOP.   Regarding patient's liver function, I do not think he is in acute liver failure as previous notes have stated, as his LFT's are improving and synthetic function appears intact. On initial presentation his coagulopathy was concerning for acute failure in setting of transaminitis; however the coagulopathy resolved which would not be expected to occur if he was in acute failure. The transaminitis was likely a combination of shock liver (  aka ischemic hepatitis) and inflammation from the associated acute cholecystitis. All of this appears improving on more recent labs. There is some question of underlying undiagnosed liver cirrhosis though. Remains on lactulose and rifaximin.   60 minutes critical care time  Vernie Murders, MD Pulmonary & Critical Care Medicine Pager:  [phone]

## 2017-12-14 NOTE — Progress Notes (Signed)
ANTICOAGULATION CONSULT NOTE - Follow Up Consult  Pharmacy Consult for heparin Indication: atrial fibrillation  Labs: Recent Labs    12/12/17 0308 12/12/17 2127 12/13/17 0500 12/13/17 0656 12/13/17 2013 12/14/17 0500  HGB 9.2*  --  10.0*  --   --  10.0*  HCT 32.0*  --  34.8*  --   --  34.0*  PLT 63*  --  52*  --   --  54*  LABPROT 23.2*  --   --  22.5*  --   --   INR 2.07  --   --  1.99  --   --   HEPARINUNFRC  --   --   --   --  <0.10* <0.10*  CREATININE 1.26* 1.33* 1.20  --   --   --     Assessment: 77yo male undetectable on heparin with initial dosing while [place] on hold; no gtt issues since IV line was lost last pm; Plt stable, no signs of bleeding per RN.  Goal of Therapy:  Heparin level 0.3-0.7 units/ml   Plan:  Will increase heparin gtt cautiously by 2 units/kg/hr to 1500 units/hr and check level in 8 hours.    Wynona Neat, PharmD, BCPS  12/14/2017,7:06 AM

## 2017-12-15 ENCOUNTER — Inpatient Hospital Stay (HOSPITAL_COMMUNITY): Payer: Medicare Other

## 2017-12-15 LAB — POCT I-STAT 3, ART BLOOD GAS (G3+)
ACID-BASE DEFICIT: 12 mmol/L — AB (ref 0.0–2.0)
ACID-BASE DEFICIT: 16 mmol/L — AB (ref 0.0–2.0)
Acid-base deficit: 15 mmol/L — ABNORMAL HIGH (ref 0.0–2.0)
Bicarbonate: 15 mmol/L — ABNORMAL LOW (ref 20.0–28.0)
Bicarbonate: 17.6 mmol/L — ABNORMAL LOW (ref 20.0–28.0)
Bicarbonate: 13.2 mmol/L — ABNORMAL LOW (ref 20.0–28.0)
O2 SAT: 39 %
O2 Saturation: 85 %
O2 Saturation: 93 %
PH ART: 6.985 — AB (ref 7.350–7.450)
PH ART: 7.13 — AB (ref 7.350–7.450)
PO2 ART: 38 mmHg — AB (ref 83.0–108.0)
TCO2: 17 mmol/L — ABNORMAL LOW (ref 22–32)
TCO2: 19 mmol/L — ABNORMAL LOW (ref 22–32)
TCO2: 14 mmol/L — ABNORMAL LOW (ref 22–32)
pCO2 arterial: 60.2 mmHg — ABNORMAL HIGH (ref 32.0–48.0)
pCO2 arterial: 64.4 mmHg — ABNORMAL HIGH (ref 32.0–48.0)
pCO2 arterial: 40.6 mmHg (ref 32.0–48.0)
pH, Arterial: 7.081 — CL (ref 7.350–7.450)
pO2, Arterial: 74 mmHg — ABNORMAL LOW (ref 83.0–108.0)
pO2, Arterial: 97 mmHg (ref 83.0–108.0)

## 2017-12-15 LAB — CBC
HCT: 34.3 % — ABNORMAL LOW (ref 39.0–52.0)
Hemoglobin: 9.3 g/dL — ABNORMAL LOW (ref 13.0–17.0)
MCH: 27.4 pg (ref 26.0–34.0)
MCHC: 27.1 g/dL — AB (ref 30.0–36.0)
MCV: 101.2 fL — ABNORMAL HIGH (ref 78.0–100.0)
PLATELETS: 53 10*3/uL — AB (ref 150–400)
RBC: 3.39 MIL/uL — AB (ref 4.22–5.81)
RDW: 22.5 % — AB (ref 11.5–15.5)
WBC: 19.7 10*3/uL — ABNORMAL HIGH (ref 4.0–10.5)

## 2017-12-15 LAB — BASIC METABOLIC PANEL
Anion gap: 11 (ref 5–15)
BUN: 61 mg/dL — AB (ref 8–23)
CALCIUM: 7.7 mg/dL — AB (ref 8.9–10.3)
CO2: 20 mmol/L — AB (ref 22–32)
CREATININE: 2.28 mg/dL — AB (ref 0.61–1.24)
Chloride: 116 mmol/L — ABNORMAL HIGH (ref 98–111)
GFR calc Af Amer: 30 mL/min — ABNORMAL LOW (ref >60)
GFR calc non Af Amer: 26 mL/min — ABNORMAL LOW (ref >60)
GLUCOSE: 72 mg/dL (ref 70–99)
Potassium: 7 mmol/L (ref 3.5–5.1)
Sodium: 147 mmol/L — ABNORMAL HIGH (ref 135–145)

## 2017-12-15 LAB — CULTURE, RESPIRATORY W GRAM STAIN: Culture: NORMAL

## 2017-12-15 LAB — C DIFFICILE QUICK SCREEN W PCR REFLEX
C DIFFICILE (CDIFF) TOXIN: NEGATIVE
C Diff antigen: NEGATIVE
C Diff interpretation: NOT DETECTED

## 2017-12-15 LAB — HEPATIC FUNCTION PANEL
ALT: 93 U/L — ABNORMAL HIGH (ref 0–44)
AST: 60 U/L — ABNORMAL HIGH (ref 15–41)
Albumin: 2.1 g/dL — ABNORMAL LOW (ref 3.5–5.0)
Alkaline Phosphatase: 38 U/L (ref 38–126)
BILIRUBIN DIRECT: 6.6 mg/dL — AB (ref 0.0–0.2)
BILIRUBIN INDIRECT: 4.2 mg/dL — AB (ref 0.3–0.9)
Total Bilirubin: 10.8 mg/dL — ABNORMAL HIGH (ref 0.3–1.2)
Total Protein: 5.2 g/dL — ABNORMAL LOW (ref 6.5–8.1)

## 2017-12-15 LAB — MAGNESIUM: MAGNESIUM: 2.7 mg/dL — AB (ref 1.7–2.4)

## 2017-12-15 LAB — HEPARIN LEVEL (UNFRACTIONATED): Heparin Unfractionated: <0.1 IU/mL — ABNORMAL LOW (ref 0.30–0.70)

## 2017-12-15 LAB — GLUCOSE, CAPILLARY
GLUCOSE-CAPILLARY: 59 mg/dL — AB (ref 70–99)
GLUCOSE-CAPILLARY: 89 mg/dL (ref 70–99)
Glucose-Capillary: 73 mg/dL (ref 70–99)
Glucose-Capillary: 30 mg/dL — CL (ref 70–99)

## 2017-12-15 LAB — CULTURE, RESPIRATORY

## 2017-12-15 LAB — PHOSPHORUS: Phosphorus: 4.9 mg/dL — ABNORMAL HIGH (ref 2.5–4.6)

## 2017-12-15 LAB — PROCALCITONIN: Procalcitonin: 7.94 ng/mL

## 2017-12-15 MED ORDER — MIDAZOLAM HCL 2 MG/2ML IJ SOLN: 2.0000 mg | INTRAMUSCULAR | Status: DC | PRN

## 2017-12-15 MED ORDER — SODIUM POLYSTYRENE SULFONATE 15 GM/60ML PO SUSP
45.0000 g | Freq: Once | ORAL | Status: AC
  Administered 2017-12-15: 15 g via ORAL
  Administered 2017-12-15: 30 g via ORAL
  Filled 2017-12-15 (×3): qty 180

## 2017-12-15 MED ORDER — SODIUM BICARBONATE 8.4 % IV SOLN
100.0000 meq | Freq: Once | INTRAVENOUS | Status: AC
  Administered 2017-12-15: 100 meq via INTRAVENOUS

## 2017-12-15 MED ORDER — DEXTROSE 50 % IV SOLN
INTRAVENOUS | Status: AC
  Administered 2017-12-15: 50 mL
  Filled 2017-12-15: qty 50

## 2017-12-15 MED ORDER — DEXTROSE IN LACTATED RINGERS 5 % IV SOLN
INTRAVENOUS | Status: DC
  Administered 2017-12-15: 08:00:00 via INTRAVENOUS

## 2017-12-15 MED ORDER — SODIUM CHLORIDE 0.9 % IV SOLN
1.0000 g | Freq: Once | INTRAVENOUS | Status: AC
  Administered 2017-12-15: 1 g via INTRAVENOUS
  Filled 2017-12-15: qty 10

## 2017-12-15 MED ORDER — VASOPRESSIN 20 UNIT/ML IV SOLN
0.0400 [IU]/min | INTRAVENOUS | Status: DC
  Administered 2017-12-15: 0.04 [IU]/min via INTRAVENOUS
  Filled 2017-12-15: qty 2

## 2017-12-15 MED ORDER — STERILE WATER FOR INJECTION IV SOLN
INTRAVENOUS | Status: DC
  Administered 2017-12-15: 11:00:00 via INTRAVENOUS
  Filled 2017-12-15 (×2): qty 850

## 2017-12-15 MED ORDER — SODIUM CHLORIDE 0.9 % IV SOLN
1.0000 mg/h | INTRAVENOUS | Status: DC
  Administered 2017-12-15: 1 mg/h via INTRAVENOUS
  Filled 2017-12-15: qty 10

## 2017-12-16 LAB — GLUCOSE, CAPILLARY: Glucose-Capillary: 148 mg/dL — ABNORMAL HIGH (ref 70–99)

## 2017-12-16 MED FILL — Medication: Qty: 1 | Status: AC

## 2017-12-16 NOTE — Progress Notes (Signed)
Critiacl ABG values given to Dr. Debbora Dus.

## 2017-12-16 NOTE — Progress Notes (Signed)
Patient's urine output poor. E-link called to notify

## 2017-12-16 NOTE — Progress Notes (Signed)
      INFECTIOUS DISEASE ATTENDING ADDENDUM:   Date: Jan 02, 2018  Patient name: Jacob Shannon  Medical record number: [id number]  Date of birth: 1940/12/10   I examined the patient earlier this morning.  I had discussions with 1 of the sons who agreed with me that the patient unlikely to improve and that the aggressive measures that the patient was receiving including dialysis mechanical ventilation and broad-spectrum antibiotics were not going to prolong his life nor improve on the quality of his life.  Subsequent to that conversation family has had further conversations with the ICU team and the patient has been extubated and now has passed on.    Rhina Brackett [place] 01-02-18, 3:10 PM

## 2017-12-16 NOTE — Progress Notes (Signed)
PULMONARY / CRITICAL CARE MEDICINE   Name: Jacob Shannon MRN: [id number] DOB: Nov 07, 1940    ADMISSION DATE:  12/01/2017 CONSULTATION DATE:  12/07/2017  REFERRING MD:  [facility] Dr. Myna Hidalgo  CHIEF COMPLAINT:  Hypoxic/lethargic   Brief HPI:   Jacob Shannon is a 77 y.o. male with new onset altered mental status who was admitted with A-fib RVR and required diltiazem infusion to control. Over the last 24-48hrs has become more lethargic and also complaining of gastric discomfort and pain. Laboratory assessment has been an issue as he has vasculitis and poor access. Additionally, the patient has a worsening renal function with metabolic acidosis on recent ABG.  6/26 The patient got prregressively more obtunded and required intubation yesterday afternoon. He is intubated and on propofl for sedation.  6/27 The a patient is a little hypotensive this AM. His BP is running 15-40 systolic. His sodium is up a bit. He had been getting dextrose overnight. He remaisn quite obtunded. His jaundice appears a bit worse a swell. His renal fn ahs improved overnight.  6/29 The patient appears to be making a little progress in some respects. Unfortunately, he is stiill having some temperature spikes generally in the evening to to >101. Antiboitic therapy changed to Merrem from Zosyn and Eraxis added by ID. It appears that wew will be able to decrease his pressor requirements presenlty. That may help with his heart rate that at times has gone up to the 130-150 range..  6/30  The Patient  Remains on the BIPAP unit. He desaturatews when it is taken off. He requires Precedex otherwise he gets too fidgety and tries to take the mask off.     VITAL SIGNS: BP (!) 119/43   Pulse 88   Temp (!) 101.2 F (38.4 C) (Axillary)   Resp 15   Ht '5\' 8"'$  (1.727 m)   Wt 221 lb 5.5 oz (100.4 kg)   SpO2 92%   BMI 33.65 kg/m   HEMODYNAMICS:    INTAKE / OUTPUT: I/O last 3 completed shifts: In: 12454.1 [I.V.:7482.8;  Other:10; NG/GT:1280; IV Piggyback:3681.3] Out: 2705 [Urine:1850; Drains:555; Stool:300]  PHYSICAL EXAMINATION: General: Frail elderly male poorly responsive. Quite jaundiced HEENT: Oral mucosa is dry, no JVD lymphadenopathy is appreciated.  Pupils equal reactive asand 20m. He is now intubated Neuro:: the patient is not responding this AM CV: Heart sounds are regular atrial fibrillation with controlled ventricular rate of 93 PULM: Decreased breath sounds at bases, periods of apnea, respiratory his respirations at times. GI: Tender to touch, right percutaneous drain right outer quadrant noted. Loose bm . Patient had dignacare in place. Drainage of bilious materal fro JP in gallbladder. Extremities: warm/dry, positive edema, bilateral bunions are noted Skin: no rashes or lesions   LABS:  BMET Recent Labs  Lab 12/13/17 0500 12/14/17 0500 0Jul 19, [id number]  NA 148* 149* 147*  K 3.5 4.8 7.0*  CL 115* 116* 116*  CO2 25 26 20*  BUN 28* 39* 61*  CREATININE 1.20 1.54* 2.28*  GLUCOSE 195* 146* 72    Electrolytes Recent Labs  Lab 12/13/17 0500 12/13/17 1715 12/14/17 0500 0July 19, [id number]  CALCIUM 7.6*  --  7.7* 7.7*  MG 2.0 1.9  --  2.7*  PHOS 2.9 1.6*  --  4.9*    CBC Recent Labs  Lab 12/14/17 0500 12/14/17 2201 [id number]  WBC 21.2* 20.5* 19.7*  HGB 10.0* 9.4* 9.3*  HCT 34.0* 32.9* 34.3*  PLT 54* 51* 53*    Coag's Recent Labs  Lab 12/08/17 0920  12/08/17 1500  12/09/17 0407  12/12/17 0308 12/13/17 0656 12/14/17 2201  APTT 81*  --  86*  --  52*  --   --   --   --   INR 7.71*   < > 7.37*   < > 4.38*   < > 2.07 1.99 1.81   < > = values in this interval not displayed.    Sepsis Markers Recent Labs  Lab 12/08/17 1728 12/09/17 0407  12/13/17 0500 12/14/17 0500 2018/01/05 0500  LATICACIDVEN 2.5* 2.1*  --   --   --   --   PROCALCITON  --  2.11   < > 3.46 8.11 7.94   < > = values in this interval not displayed.    ABG Recent Labs  Lab 12/13/17 0534  12/14/17 0405 01/05/2018 0832  PHART 7.423 7.374 6.985*  PCO2ART 33.4 44.4 64.4*  PO2ART 126.0* 135* 38.0*    Liver Enzymes Recent Labs  Lab 12/14/17 0500 12/14/17 1630 01/05/2018 0500  AST 35 29 60*  ALT 113* 93* 93*  ALKPHOS 43 44 38  BILITOT 13.3* 10.3* 10.8*  ALBUMIN 2.1* 2.0* 2.1*    Cardiac Enzymes Recent Labs  Lab 12/08/17 1700  TROPONINI 0.12*    Glucose Recent Labs  Lab 12/14/17 1944 12/14/17 2337 Jan 05, 2018 0334 January 05, 2018 0744 01/05/2018 0812 01-05-18 0836  GLUCAP 117* 116* 73 30* 59* 89    Imaging Dg Chest Port 1 View  Result Date: January 05, 2018 CLINICAL DATA:  Acute resp failure. EXAM: PORTABLE CHEST - 1 VIEW COMPARISON:  12/13/2017 FINDINGS: Endotracheal tube and nasogastric tube remain in place. Some worsening of interstitial and airspace opacities bilaterally involving bases more than apices. Probable bilateral layering pleural effusions. Stable cardiomegaly.  Aortic Atherosclerosis (ICD10-170.0). Visualized bones unremarkable. IMPRESSION: 1. Worsening bibasilar infiltrates or edema with probable layering effusions. 2. Stable cardiomegaly 3.  Support hardware stable in position. Electronically Signed   By: Lucrezia Europe M.D.   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 Nonvisualization of the gallbladder and small bowel with no evidence of radiotracer within the common bile duct. Persistent activity noted throughout the liver. Findings support hepatocellular dysfunction, with depressed hepatic excretion of bile. 2. Acute cholecystitis is not excluded.   CULTURES: BCx 6/22>>NGTD 12/09/2017 percutaneous drainage fluid>>  ANTIBIOTICS: Vancomycin 6/21>>6/22 Zosyn 6/21>>>  SIGNIFICANT EVENTS: -INR elevated >10 and treated with Vit K/Praxbind. Repeat 3>>>7 -LFTs continue to worsen  -IR placed right Foley drain 12/09/2017  LINES/TUBES: CVC RIJ  6/22>>  DISCUSSION: Patient presented with biliary sepsis several days ago. Had a choley tube placed and was started on abx. Course since complicated by acute respiratory failure, rapid atrial fibn and septic shock. ASSESSMENT / PLAN:  PULMONARY  The patient is on FI02 of 50% now. I am waiting repeat CXR. As noted he had to be vigorously suctioned overnight.  CARDIOVASCULAR  Patient in rapid Atrial fib. He is on amiodarone drip. I am adding small dose of po metoprolol and in the interim adding a cardizem drip. In the interim, we will endeavor to wean down the levophed. The patient is in fairly profound shock and his pressor requirements just went up. He is on high dose levophed and I am adding vasopressin. The patient is getting a dose of albumin as well  Hyperkalemia His K is 7. I wrote for some kayexelatee and calcium and I have call  into the Nephrology service        RENAL Lab Results  Component Value Date   CREATININE 2.28 (H) 12/22/2017   CREATININE 1.54 (H) 12/14/2017   CREATININE 1.20 12/13/2017   CREATININE 0.99 09/09/2015   Recent Labs  Lab 12/13/17 0500 12/14/17 0500 22-Dec-2017 0500  K 3.5 4.8 7.0*   Creatinine is 2.28 this AM. Urine output has significantly dropped off in the last 12 hours in particular      GASTROINTESTINAL A:   Acute liver failure, unclear etiology  Cholecystitis  P:   Percutaneous drain placed on 12/09/2017. His bili continues toi climb daily. Alk phos stable. I am going to ask Gi  To weigh in on his conftion. I would like to send him for another CT scan but would prefer to do do so when his creatinine has improved some. I am getting CT abdo and pelvis today. His total bili has been climbing thepast few days despite his recent cholecystostomy. His INR is down to 2 a this time. It appaears that his bili has peaked and may be oin it's way down. His ammmoniua level was a little elevated at 62 yesterday. Will continue  lactulose.   HEMATOLOGIC Lab Results  Component Value Date   INR 1.81 12/14/2017   INR 1.99 12/13/2017   INR 2.07 12/12/2017    A:   Coagulopathy  P:  Status post FFP infusion for IR to place drain Status post vitamin K infusion Continue to monitor INR The platelet count is low but has been hovering in the 50 K range   INFECTIOUS A:   Cholecystitis  P:   Percutaneous drain placed per IR on 12/09/2017 Continue n.p.o. Continue antimicrobial therapy  ABX coverage changed as noted to Vanco, Merrem and Eraxis.  ENDOCRINE CBG (last 3)  Recent Labs    2017-12-22 0744 12/22/2017 0812 12/22/2017 0836  GLUCAP 30* 59* 89    A:   Hypoglycemia, resolved    P:   Monitor for sliding scale insulin protocol. Had one brief episode of hypoglycemia yesterday. The patiuent was taken off D5LR and hjis blood sugar dropped  So he was given an amp of D50W and is now back on it  NEUROLOGIC   Nort as responsive this AM as he was yesterday earlier in the day.   Micheal Likens MD [facility]   2017-12-22, 8:49 AM

## 2017-12-16 NOTE — Progress Notes (Signed)
Pt extubated with RN and family at bedside per withdrawal order.

## 2017-12-16 NOTE — Progress Notes (Signed)
Pt pronounced deceased at 37 by Fontaine Hehl RN and [facility] RN

## 2017-12-16 NOTE — Progress Notes (Signed)
Critical ABG values given to Dr. Debbora Dus.

## 2017-12-16 NOTE — Progress Notes (Signed)
PULMONARY / CRITICAL CARE MEDICINE   Name: Jacob Shannon MRN: [id number] DOB: 1940-11-05    ADMISSION DATE:  11/26/2017 CONSULTATION DATE:  12/07/2017  REFERRING MD:  [facility] Dr. Myna Hidalgo  CHIEF COMPLAINT:  Hypoxic/lethargic   Brief HPI:   Jacob Shannon is a 78 y.o. male with new onset altered mental status who was admitted with A-fib RVR and required diltiazem infusion to control. Over the last 24-48hrs has become more lethargic and also complaining of gastric discomfort and pain. Laboratory assessment has been an issue as he has vasculitis and poor access. Additionally, the patient has a worsening renal function with metabolic acidosis on recent ABG.  6/26 The patient got prregressively more obtunded and required intubation yesterday afternoon. He is intubated and on propofl for sedation.  6/27 The a patient is a little hypotensive this AM. His BP is running 29-51 systolic. His sodium is up a bit. He had been getting dextrose overnight. He remaisn quite obtunded. His jaundice appears a bit worse a swell. His renal fn ahs improved overnight.  6/29 The patient appears to be making a little progress in some respects. Unfortunately, he is stiill having some temperature spikes generally in the evening to to >101. Antiboitic therapy changed to Merrem from Zosyn and Eraxis added by ID. It appears that wew will be able to decrease his pressor requirements presenlty. That may help with his heart rate that at times has gone up to the 130-150 range..  6/30 the patient had some difficulties overnight. He developed some mucous plugging and needed vigorous suctionog. His FI02 had to be increased a little bit. He remains in shock onLevo and his requirements just increased. His kidneys appear to be worser and thepatient hs developed Hyperkalemiua. It appears that the patient is going to require CRRT. I just placed a call to Nephrology    VITAL SIGNS: BP (!) 84/61   Pulse 78   Temp 100 F (37.8 C)  (Axillary)   Resp 15   Ht '5\' 8"'$  (1.727 m)   Wt 221 lb 5.5 oz (100.4 kg)   SpO2 93%   BMI 33.65 kg/m   HEMODYNAMICS:    INTAKE / OUTPUT: I/O last 3 completed shifts: In: 12454.1 [I.V.:7482.8; Other:10; NG/GT:1280; IV Piggyback:3681.3] Out: 2705 [Urine:1850; Drains:555; Stool:300]  PHYSICAL EXAMINATION: General: Frail elderly male poorly responsive. Quite jaundiced HEENT: Oral mucosa is dry, no JVD lymphadenopathy is appreciated.  Pupils equal reactive asand 26m. He is now intubated Neuro:: the patient is not responding this AM CV: Heart sounds are regular atrial fibrillation with controlled ventricular rate of 93 PULM: Decreased breath sounds at bases, periods of apnea, respiratory his respirations at times. GI: Tender to touch, right percutaneous drain right outer quadrant noted. Loose bm . Patient had dignacare in place. Drainage of bilious materal fro JP in gallbladder. Extremities: warm/dry, positive edema, bilateral bunions are noted Skin: no rashes or lesions   LABS:  BMET Recent Labs  Lab 12/13/17 0500 12/14/17 0500 0July 09, [id number]  NA 148* 149* 147*  K 3.5 4.8 7.0*  CL 115* 116* 116*  CO2 25 26 20*  BUN 28* 39* 61*  CREATININE 1.20 1.54* 2.28*  GLUCOSE 195* 146* 72    Electrolytes Recent Labs  Lab 12/13/17 0500 12/13/17 1715 12/14/17 0500 007/09/190500  CALCIUM 7.6*  --  7.7* 7.7*  MG 2.0 1.9  --  2.7*  PHOS 2.9 1.6*  --  4.9*    CBC Recent Labs  Lab 12/14/17 0500 12/14/17 2201  16-Dec-2017 0500  WBC 21.2* 20.5* 19.7*  HGB 10.0* 9.4* 9.3*  HCT 34.0* 32.9* 34.3*  PLT 54* 51* 53*    Coag's Recent Labs  Lab 12/08/17 0920  12/08/17 1500  12/09/17 0407  12/12/17 0308 12/13/17 0656 12/14/17 2201  APTT 81*  --  86*  --  52*  --   --   --   --   INR 7.71*   < > 7.37*   < > 4.38*   < > 2.07 1.99 1.81   < > = values in this interval not displayed.    Sepsis Markers Recent Labs  Lab 12/08/17 1728  12/09/17 0407 12/12/17 0308 12/13/17 0500  12/14/17 0500  LATICACIDVEN 2.5*  --  2.1*  --   --   --   PROCALCITON  --    < > 2.11 1.27 3.46 8.11   < > = values in this interval not displayed.    ABG Recent Labs  Lab 12/12/17 0853 12/13/17 0534 12/14/17 0405  PHART 7.467* 7.423 7.374  PCO2ART 42.3 33.4 44.4  PO2ART 94.9 126.0* 135*    Liver Enzymes Recent Labs  Lab 12/14/17 0500 12/14/17 1630 12-16-2017 0500  AST 35 29 60*  ALT 113* 93* 93*  ALKPHOS 43 44 38  BILITOT 13.3* 10.3* 10.8*  ALBUMIN 2.1* 2.0* 2.1*    Cardiac Enzymes Recent Labs  Lab 12/08/17 1700  TROPONINI 0.12*    Glucose Recent Labs  Lab 12/14/17 1144 12/14/17 1535 12/14/17 1944 12/14/17 2337 12-16-17 0334 16-Dec-2017 0744  GLUCAP 135* 137* 117* 116* 73 30*    Imaging No results found.   STUDIES:  CTH 6/21>>NAICA, old infarct CT A/P 6/22>>Mild ascites with cholelithiasis and possible sludge  HIDA 6/23>>1. Nonvisualization of the gallbladder and small bowel with no evidence of radiotracer within the common bile duct. Persistent activity noted throughout the liver. Findings support hepatocellular dysfunction, with depressed hepatic excretion of bile. 2. Acute cholecystitis is not excluded.   CULTURES: BCx 6/22>>NGTD 12/09/2017 percutaneous drainage fluid>>  ANTIBIOTICS: Vancomycin 6/21>>6/22 Zosyn 6/21>>>  SIGNIFICANT EVENTS: -INR elevated >10 and treated with Vit K/Praxbind. Repeat 3>>>7 -LFTs continue to worsen  -IR placed right Foley drain 12/09/2017  LINES/TUBES: CVC RIJ 6/22>>  DISCUSSION: Patient presented with biliary sepsis several days ago. Had a choley tube placed and was started on abx. Course since complicated by acute respiratory failure, rapid atrial fibn and septic shock. ASSESSMENT / PLAN:  PULMONARY  The patient is on FI02 of 50% now. I am waiting repeat CXR. As noted he had to be vigorously suctioned overnight.  CARDIOVASCULAR  Patient in rapid Atrial fib. He is on amiodarone drip. I am adding  small dose of po metoprolol and in the interim adding a cardizem drip. In the interim, we will endeavor to wean down the levophed. The patient is in fairly profound shock and his pressor requirements just went up. He is on high dose levophed and I am adding vasopressin. The patient is getting a dose of albumin as well  Hyperkalemia His K is 7. I wrote for some kayexelatee and calcium and I have call into the Nephrology service        RENAL Lab Results  Component Value Date   CREATININE 2.28 (H) 12-16-2017   CREATININE 1.54 (H) 12/14/2017   CREATININE 1.20 12/13/2017   CREATININE 0.99 09/09/2015   Recent Labs  Lab 12/13/17 0500 12/14/17 0500 12/16/2017 0500  K 3.5 4.8 7.0*   Creatinine is 2.28 this AM. Urine  output has significantly dropped off in the last 12 hours in particular      GASTROINTESTINAL A:   Acute liver failure, unclear etiology  Cholecystitis  P:   Percutaneous drain placed on 12/09/2017. His bili continues toi climb daily. Alk phos stable. I am going to ask Gi  To weigh in on his conftion. I would like to send him for another CT scan but would prefer to do do so when his creatinine has improved some. I am getting CT abdo and pelvis today. His total bili has been climbing thepast few days despite his recent cholecystostomy. His INR is down to 2 a this time. It appaears that his bili has peaked and may be oin it's way down. His ammmoniua level was a little elevated at 62 yesterday. Will continue lactulose.   HEMATOLOGIC Lab Results  Component Value Date   INR 1.81 12/14/2017   INR 1.99 12/13/2017   INR 2.07 12/12/2017    A:   Coagulopathy  P:  Status post FFP infusion for IR to place drain Status post vitamin K infusion Continue to monitor INR The platelet count is low but has been hovering in the 50 K range   INFECTIOUS A:   Cholecystitis  P:   Percutaneous drain placed per IR on 12/09/2017 Continue n.p.o. Continue antimicrobial therapy  ABX  coverage changed as noted to Vanco, Merrem and Eraxis.  ENDOCRINE CBG (last 3)  Recent Labs    12/14/17 2337 12/31/2017 0334 December 31, 2017 0744  GLUCAP 116* 73 30*    A:   Hypoglycemia, resolved    P:   Monitor for sliding scale insulin protocol. Had one brief episode of hypoglycemia yesterday. The patiuent was taken off D5LR and hjis blood sugar dropped  So he was given an amp of D50W and is now back on it  NEUROLOGIC   Nort as responsive this AM as he was yesterday earlier in the day.   Micheal Likens MD [facility] Pager [phone] till 1 pm If no answer page [phone] 2017/12/31, 8:04 AM

## 2017-12-16 NOTE — Progress Notes (Signed)
Hypoglycemic Event  CBG: 30  Treatment: D50 IV 50 mL  Symptoms: None  Follow-up CBG: Time:0820 CBG Result:59  Possible Reasons for Event: Unknown  Comments/MD notified:Yes.   Hypoglycemic Event  CBG: 59  Treatment: D50 IV 50 mL  Symptoms: None  Follow-up CBG: [id number] CBG Result:89  Possible Reasons for Event: Unknown  Comments/MD notified:Yes    Jacob Shannon, [place], [place]

## 2017-12-16 NOTE — Consult Note (Signed)
Renal Service Consult Note [facility]  Jacob Shannon December 23, 2017 Jacob Shannon Requesting Physician:  Dr. Debbora Dus  Reason for Consult:  AKI/ hyperkalemia HPI: The patient is a 77 y.o. year-old with hx of pulm HTN, LCK vasculitis, gout, HL, HTN, diast CHF, chronic afib admitted on 6/21 w/ biliary sepsis.  Had chole tube placed percutaneously and rx'd w IV abx.  Course c/b acute resp failure on vent, rapid afib and septic shock.  Is on increasing vasopressor support and now declining UOP and rising creat / met acidosis and hyperkalemia w/ K+ 7.0.  Kayexalate ordered this am. Asked to see for AKI.   IV abx inpatient:  anidulafungin 6/28 >> current  meropenem 6/28 >. Current  zosyn IV 6/22 > 6/28  vancomycin 6.27 > current Other IP meds:  ibuprofen 6/27 > current  cardizem IV  IV steroids  rifaximin po  IV amio  IV levophed and vasopressin  protonix - started today, IV pepcid since admit dc'd yest   echo - normal LVEF, mod to severe MR, RV severely dilated, mod reduced fxn, TV severely dilated annulus w/ malcoaptation of leaflets, severe TR, mod ^in pulm artery pressures peak 53 mm Hg  ROS  denies CP  no joint pain   no HA  no blurry vision  no rash  no diarrhea  no nausea/ vomiting  no dysuria  no difficulty voiding  no change in urine color    Past Medical History  Past Medical History:  Diagnosis Date  . Chronic atrial fibrillation ([place])   . Complication of anesthesia    pt states that  "i'm hard to wake up"  . Diastolic heart failure ([place])   . Essential hypertension   . Gout   . Hypercholesterolemia   . Leukocytoclastic vasculitis ([place])   . Mitral regurgitation    Moderate August 2016  . Secondary pulmonary hypertension    RVSP 49 mmHg August 2016  . Spinal stenosis   . 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 CARDIAC CATHETERIZATION  N/A 08/26/2015   Procedure: Right/Left Heart Cath and Coronary Angiography;  Surgeon: Peter M Martinique, MD;  Location: [place] [facility];  Service: Cardiovascular;  Laterality: N/A;  . CATARACT EXTRACTION Bilateral   . COLONOSCOPY    . IR PERC CHOLECYSTOSTOMY  12/09/2017  . LEG SKIN LESION  BIOPSY / EXCISION Right 2010  . TEE WITHOUT CARDIOVERSION N/A 08/25/2015   Procedure: TRANSESOPHAGEAL ECHOCARDIOGRAM (TEE);  Surgeon: Sanda Klein, MD;  Location: [facility] ENDOSCOPY;  Service: Cardiovascular;  Laterality: N/A;   Family History  Family History  Problem Relation Age of Onset  . Heart attack Father   . Coronary artery disease Father   . Colon cancer Brother   . Coronary artery disease Brother   . Stomach cancer Brother   . Lung disease Neg Hx   . Rheumatologic disease Neg Hx    Social History  reports that he quit smoking about 23 years ago. His smoking use included cigarettes. He started smoking about 61 years ago. He has a 57.00 pack-year smoking history. He has never used smokeless tobacco. He reports that he drinks alcohol. He reports that he does not use drugs. Allergies  Allergies  Allergen Reactions  . Tramadol Nausea And Vomiting   Home medications Prior to Admission medications   Medication Sig Start Date End Date Taking? Authorizing  Provider  colchicine 0.6 MG tablet Take 0.6 mg by mouth 2 (two) times daily.    Yes [provider]  dabigatran (PRADAXA) 150 MG CAPS capsule Take 150 mg by mouth 2 (two) times daily.   Yes [provider]  docusate sodium (COLACE) 100 MG capsule Take 100 mg by mouth daily.   Yes [provider]  DULoxetine (CYMBALTA) 30 MG capsule Take 30 mg by mouth daily.   Yes [provider]  metoprolol tartrate (LOPRESSOR) 25 MG tablet Take 12.5-25 mg by mouth See admin instructions. Take 12.5 mg by mouth in the morning and 25 mg in the evening   Yes [provider]  potassium chloride SA (K-DUR,KLOR-CON) 20 MEQ tablet  Take 20 mEq by mouth 2 (two) times daily.   Yes [provider]  torsemide (DEMADEX) 20 MG tablet Take 40-60 mg by mouth See admin instructions. Take 60 mg by mouth in the morning and 40 mg midday for fluid/edema 11/19/16  Yes Jordan, Peter M, MD  metoprolol succinate (TOPROL-XL) 25 MG 24 hr tablet Take 1 tablet (25 mg total) by mouth 3 (three) times daily. PLEASE CONTACT OFFICE FOR ADDITIONAL REFILLS Patient not taking: Reported on 12/05/2017 09/11/16   Jordan, Peter M, MD   Liver Function Tests Recent Labs  Lab 12/14/17 0500 12/14/17 1630 11/30/2017 0500  AST 35 29 60*  ALT 113* 93* 93*  ALKPHOS 43 44 38  BILITOT 13.3* 10.3* 10.8*  PROT 5.2* 4.0* 5.2*  ALBUMIN 2.1* 2.0* 2.1*   Recent Labs  Lab 12/12/17 0308  LIPASE 94*   CBC Recent Labs  Lab 12/13/17 0500 12/14/17 0500 12/14/17 2201 12/05/2017 0500  WBC 11.9* 21.2* 20.5* 19.7*  NEUTROABS 11.0* 19.1* 18.1*  --   HGB 10.0* 10.0* 9.4* 9.3*  HCT 34.8* 34.0* 32.9* 34.3*  MCV 95.1 93.7 96.2 101.2*  PLT 52* 54* 51* 53*   Basic Metabolic Panel Recent Labs  Lab 12/11/17 0523 12/11/17 1512 12/12/17 0308 12/12/17 1645 12/12/17 2127 12/13/17 0500 12/13/17 1715 12/14/17 0500 11/22/2017 0500  NA 154* 153* 152*  --  149* 148*  --  149* 147*  K 2.8* 3.4* 3.2*  --  3.6 3.5  --  4.8 7.0*  CL 109 110 114*  --  113* 115*  --  116* 116*  CO2 34* 35* 31  --  28 25  --  26 20*  GLUCOSE 126* 166* 156*  --  176* 195*  --  146* 72  BUN 32* 27* 27*  --  30* 28*  --  39* 61*  CREATININE 1.58* 1.45* 1.26*  --  1.33* 1.20  --  1.54* 2.28*  CALCIUM 8.5* 8.3* 7.9*  --  7.7* 7.6*  --  7.7* 7.7*  PHOS 1.5*  --  2.3* 1.4* 1.4* 2.9 1.6*  --  4.9*   Iron/TIBC/Ferritin/ %Sat No results found for: IRON, TIBC, FERRITIN, IRONPCTSAT  Vitals:   11/19/2017 0857 11/21/2017 0900 12/07/2017 0903 12/13/2017 0906  BP: (!) 119/49 (!) 121/51 (!) 116/51 (!) 123/101  Pulse: 88 97 91 89  Resp: 15 14 13 15  Temp:      TempSrc:      SpO2: 96% 98% 100% 98%   Weight:      Height:       Exam Gen on vent, not responding No rash, cyanosis or gangrene Sclera anicteric, throat w ETT  No jvd or bruits  Chest some bilat rhonchi, mostly clear Cor irreg w/o MRG Abd soft ntnd   no mass or ascites +bs, RUQ chole tube, rectal tube GU normal male w foley , small amt amber clear urine MS no joint effusions or deformity Ext diffuse mottling LE's from hips to feet, 1+  edema Neuro is sedated on ventilator    Home meds:  - colchicine 0.6/ pradaxa 150 bid/ colace 100 qd/ cymbalta 30  - lopressor 25 qhs / Kdur 20 bid/ demadex 60 am and 40 pm/ toprol xl 25    Na 147  K 7.0  CO2 20 BUN 61  Cr 2.28  Alb 2.1  Tbili 10.8   eGFR 26  WBC 19k Hb 9.3  plt 53    Impression: 1  Oliguric AKI - in setting of severe septic shock w/ hyperkalemia and metabolic acidosis.  D/ W family , very sick and CRRT would be an option but not likely to change outcome.   Family discussed and they do not want to proceed with RRT.  Have d/w primary MD. Recommend medical Rx.  Already rec'd kayexalate 45 gm per NG this am and getting bicarb drip.  No other suggestions at this time.  Will follow.   2  ^LFT"S - seen by GI , ^LFT's prob ischemic hepatitis and suspected cirrhosis / hepatic encephalopathy. Getting lacutlose and Xifaxan.  3  VDRF  4  Septic shock - on 2 pressors 5  Pulm HTN/ RV dysfunction - per echo 6  Hyperkalemia - as above 7  Afib , chronic   Plan - as above  Rob Schertz MD [facility] pager [phone]   11/26/2017, 9:59 AM   

## 2017-12-16 NOTE — Progress Notes (Signed)
[facility] Progress Note  Subjective: The patient was seen in follow-up today in regards to elevated liver enzymes suspected ischemic hepatitis suspected cirrhosis and hepatic encephalopathy.  The patient is intubated and sedated.  Not responding to me.  Total bilirubin 10.8, AST 60, ALT 93  Objective: Vital signs in last 24 hours: Temp:  [99.9 F (37.7 C)-102.4 F (39.1 C)] 101.2 F (38.4 C) (06/30 0758) Pulse Rate:  [41-172] 89 (06/30 0906) Resp:  [9-19] 15 (06/30 0906) BP: (83-155)/(19-101) 123/101 (06/30 0906) SpO2:  [91 %-100 %] 98 % (06/30 0906) FiO2 (%):  [40 %-50 %] 50 % (06/30 0758) Weight:  [100.4 kg (221 lb 5.5 oz)] 100.4 kg (221 lb 5.5 oz) (06/30 0500) Weight change: 9.4 kg (20 lb 11.6 oz)   PE:  Intubated and sedated  Heart regular rhythm  Abdomen nontender  Lab Results: Results for orders placed or performed during the hospital encounter of 11/17/2017 (from the past 24 hour(s))  Glucose, capillary     Status: Abnormal   Collection Time: 12/14/17 11:44 AM  Result Value Ref Range   Glucose-Capillary 135 (H) 70 - 99 mg/dL  MRSA PCR Screening     Status: None   Collection Time: 12/14/17  2:25 PM  Result Value Ref Range   MRSA by PCR NEGATIVE NEGATIVE  Heparin level (unfractionated)     Status: Abnormal   Collection Time: 12/14/17  2:25 PM  Result Value Ref Range   Heparin Unfractionated <0.10 (L) 0.30 - 0.70 IU/mL  Glucose, capillary     Status: Abnormal   Collection Time: 12/14/17  3:35 PM  Result Value Ref Range   Glucose-Capillary 137 (H) 70 - 99 mg/dL  Hepatic function panel     Status: Abnormal   Collection Time: 12/14/17  4:30 PM  Result Value Ref Range   Total Protein 4.0 (L) 6.5 - 8.1 g/dL   Albumin 2.0 (L) 3.5 - 5.0 g/dL   AST 29 15 - 41 U/L   ALT 93 (H) 0 - 44 U/L   Alkaline Phosphatase 44 38 - 126 U/L   Total Bilirubin 10.3 (H) 0.3 - 1.2 mg/dL   Bilirubin, Direct 6.1 (H) 0.0 - 0.2 mg/dL   Indirect Bilirubin 4.2 (H) 0.3 - 0.9 mg/dL   Glucose, capillary     Status: Abnormal   Collection Time: 12/14/17  7:44 PM  Result Value Ref Range   Glucose-Capillary 117 (H) 70 - 99 mg/dL  Urinalysis, Routine w reflex microscopic     Status: Abnormal   Collection Time: 12/14/17 10:01 PM  Result Value Ref Range   Color, Urine AMBER (A) YELLOW   APPearance HAZY (A) CLEAR   Specific Gravity, Urine 1.024 1.005 - 1.030   pH 5.0 5.0 - 8.0   Glucose, UA NEGATIVE NEGATIVE mg/dL   Hgb urine dipstick SMALL (A) NEGATIVE   Bilirubin Urine MODERATE (A) NEGATIVE   Ketones, ur 5 (A) NEGATIVE mg/dL   Protein, ur NEGATIVE NEGATIVE mg/dL   Nitrite NEGATIVE NEGATIVE   Leukocytes, UA NEGATIVE NEGATIVE   RBC / HPF 6-10 0 - 5 RBC/hpf   WBC, UA 0-5 0 - 5 WBC/hpf   Bacteria, UA RARE (A) NONE SEEN   Squamous Epithelial / LPF 0-5 0 - 5   Mucus PRESENT    Hyaline Casts, UA PRESENT   CBC with Differential/Platelet     Status: Abnormal   Collection Time: 12/14/17 10:01 PM  Result Value Ref Range   WBC 20.5 (H) 4.0 - 10.5 K/uL  RBC 3.42 (L) 4.22 - 5.81 MIL/uL   Hemoglobin 9.4 (L) 13.0 - 17.0 g/dL   HCT 32.9 (L) 39.0 - 52.0 %   MCV 96.2 78.0 - 100.0 fL   MCH 27.5 26.0 - 34.0 pg   MCHC 28.6 (L) 30.0 - 36.0 g/dL   RDW 22.1 (H) 11.5 - 15.5 %   Platelets 51 (L) 150 - 400 K/uL   Neutrophils Relative % 88 %   Lymphocytes Relative 9 %   Monocytes Relative 3 %   Eosinophils Relative 0 %   Basophils Relative 0 %   Neutro Abs 18.1 (H) 1.7 - 7.7 K/uL   Lymphs Abs 1.8 0.7 - 4.0 K/uL   Monocytes Absolute 0.6 0.1 - 1.0 K/uL   Eosinophils Absolute 0.0 0.0 - 0.7 K/uL   Basophils Absolute 0.0 0.0 - 0.1 K/uL   RBC Morphology BURR CELLS    WBC Morphology INCREASED BANDS (>20% BANDS)   Protime-INR     Status: Abnormal   Collection Time: 12/14/17 10:01 PM  Result Value Ref Range   Prothrombin Time 20.8 (H) 11.4 - 15.2 seconds   INR 1.81   Fibrinogen     Status: None   Collection Time: 12/14/17 10:01 PM  Result Value Ref Range   Fibrinogen 296 210 - 475  mg/dL  C difficile quick scan w PCR reflex     Status: None   Collection Time: 12/14/17 10:01 PM  Result Value Ref Range   C Diff antigen NEGATIVE NEGATIVE   C Diff toxin NEGATIVE NEGATIVE   C Diff interpretation No C. difficile detected.   Glucose, capillary     Status: Abnormal   Collection Time: 12/14/17 11:37 PM  Result Value Ref Range   Glucose-Capillary 116 (H) 70 - 99 mg/dL  Glucose, capillary     Status: None   Collection Time: 2017-12-23  3:34 AM  Result Value Ref Range   Glucose-Capillary 73 70 - 99 mg/dL  Hepatic function panel     Status: Abnormal   Collection Time: 12-23-2017  5:00 AM  Result Value Ref Range   Total Protein 5.2 (L) 6.5 - 8.1 g/dL   Albumin 2.1 (L) 3.5 - 5.0 g/dL   AST 60 (H) 15 - 41 U/L   ALT 93 (H) 0 - 44 U/L   Alkaline Phosphatase 38 38 - 126 U/L   Total Bilirubin 10.8 (H) 0.3 - 1.2 mg/dL   Bilirubin, Direct 6.6 (H) 0.0 - 0.2 mg/dL   Indirect Bilirubin 4.2 (H) 0.3 - 0.9 mg/dL  Heparin level (unfractionated)     Status: Abnormal   Collection Time: 12-23-2017  5:00 AM  Result Value Ref Range   Heparin Unfractionated <0.10 (L) 0.30 - 0.70 IU/mL  CBC     Status: Abnormal   Collection Time: 12/23/2017  5:00 AM  Result Value Ref Range   WBC 19.7 (H) 4.0 - 10.5 K/uL   RBC 3.39 (L) 4.22 - 5.81 MIL/uL   Hemoglobin 9.3 (L) 13.0 - 17.0 g/dL   HCT 34.3 (L) 39.0 - 52.0 %   MCV 101.2 (H) 78.0 - 100.0 fL   MCH 27.4 26.0 - 34.0 pg   MCHC 27.1 (L) 30.0 - 36.0 g/dL   RDW 22.5 (H) 11.5 - 15.5 %   Platelets 53 (L) 150 - 400 K/uL  Magnesium     Status: Abnormal   Collection Time: 12-23-2017  5:00 AM  Result Value Ref Range   Magnesium 2.7 (H) 1.7 - 2.4 mg/dL  Phosphorus  Status: Abnormal   Collection Time: 13-Jan-2018  5:00 AM  Result Value Ref Range   Phosphorus 4.9 (H) 2.5 - 4.6 mg/dL  Basic metabolic panel     Status: Abnormal   Collection Time: 01-13-18  5:00 AM  Result Value Ref Range   Sodium 147 (H) 135 - 145 mmol/L   Potassium 7.0 (HH) 3.5 - 5.1 mmol/L    Chloride 116 (H) 98 - 111 mmol/L   CO2 20 (L) 22 - 32 mmol/L   Glucose, Bld 72 70 - 99 mg/dL   BUN 61 (H) 8 - 23 mg/dL   Creatinine, Ser 2.28 (H) 0.61 - 1.24 mg/dL   Calcium 7.7 (L) 8.9 - 10.3 mg/dL   GFR calc non Af Amer 26 (L) >60 mL/min   GFR calc Af Amer 30 (L) >60 mL/min   Anion gap 11 5 - 15  Procalcitonin     Status: None   Collection Time: 01/13/18  5:00 AM  Result Value Ref Range   Procalcitonin 7.94 ng/mL  Glucose, capillary     Status: Abnormal   Collection Time: 01-13-2018  7:44 AM  Result Value Ref Range   Glucose-Capillary 30 (LL) 70 - 99 mg/dL   Comment 1 Notify RN    Comment 2 Document in Chart   Glucose, capillary     Status: Abnormal   Collection Time: 2018-01-13  8:12 AM  Result Value Ref Range   Glucose-Capillary 59 (L) 70 - 99 mg/dL  I-STAT 3, arterial blood gas (G3+)     Status: Abnormal   Collection Time: 2018-01-13  8:32 AM  Result Value Ref Range   pH, Arterial 6.985 (LL) 7.350 - 7.450   pCO2 arterial 64.4 (H) 32.0 - 48.0 mmHg   pO2, Arterial 38.0 (LL) 83.0 - 108.0 mmHg   Bicarbonate 15.0 (L) 20.0 - 28.0 mmol/L   TCO2 17 (L) 22 - 32 mmol/L   O2 Saturation 39.0 %   Acid-base deficit 16.0 (H) 0.0 - 2.0 mmol/L   Patient temperature 101.2 F    Collection site RADIAL, ALLEN'S TEST ACCEPTABLE    Drawn by RT    Sample type ARTERIAL    Comment MD NOTIFIED, SUGGEST RECOLLECT   Glucose, capillary     Status: None   Collection Time: January 13, 2018  8:36 AM  Result Value Ref Range   Glucose-Capillary 89 70 - 99 mg/dL  I-STAT 3, arterial blood gas (G3+)     Status: Abnormal   Collection Time: 01/13/2018  8:54 AM  Result Value Ref Range   pH, Arterial 7.081 (LL) 7.350 - 7.450   pCO2 arterial 60.2 (H) 32.0 - 48.0 mmHg   pO2, Arterial 74.0 (L) 83.0 - 108.0 mmHg   Bicarbonate 17.6 (L) 20.0 - 28.0 mmol/L   TCO2 19 (L) 22 - 32 mmol/L   O2 Saturation 85.0 %   Acid-base deficit 12.0 (H) 0.0 - 2.0 mmol/L   Patient temperature 100.5 F    Collection site RADIAL, ALLEN'S  TEST ACCEPTABLE    Drawn by RT    Sample type ARTERIAL    Comment NOTIFIED PHYSICIAN     Studies/Results: Dg Chest Port 1 View  Result Date: January 13, 2018 CLINICAL DATA:  Acute resp failure. EXAM: PORTABLE CHEST - 1 VIEW COMPARISON:  12/13/2017 FINDINGS: Endotracheal tube and nasogastric tube remain in place. Some worsening of interstitial and airspace opacities bilaterally involving bases more than apices. Probable bilateral layering pleural effusions. Stable cardiomegaly.  Aortic Atherosclerosis (ICD10-170.0). Visualized bones unremarkable. IMPRESSION: 1. Worsening bibasilar  infiltrates or edema with probable layering effusions. 2. Stable cardiomegaly 3.  Support hardware stable in position. Electronically Signed   By: Lucrezia Europe M.D.   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

## 2017-12-16 DEATH — deceased

## 2017-12-17 LAB — CULTURE, BLOOD (ROUTINE X 2)
CULTURE: NO GROWTH
Culture: NO GROWTH
SPECIAL REQUESTS: ADEQUATE
Special Requests: ADEQUATE

## 2017-12-18 ENCOUNTER — Telehealth: Payer: Self-pay

## 2017-12-18 NOTE — Telephone Encounter (Signed)
On 12/18/17 I received a d/c from [facility] (original). The d/c is for burial. The patient is a patient of Doctor Rosenbalt. The d/c will be taken to [facility] for signature.  On 12/23/17 I received the d/c back from Doctor Rosenbalt.  I got the d/c ready and called the funeral home to let them know I mailed the d/c to [employer] per the funeral home request.

## 2017-12-19 LAB — CULTURE, BLOOD (ROUTINE X 2): Culture: NO GROWTH

## 2017-12-26 NOTE — Telephone Encounter (Signed)
Signing encounter of Attempted phone call

## 2018-01-16 NOTE — Death Summary Note (Signed)
Jacob Shannon was a 77 y.o. male admitted on 11/24/2017 with altered mental status.  He was recently started on ultram and cymbalta for back pain.  He was noted to have acute renal failure.  Developed A fib with RVR.  Concern for acute cholecystitis.  IR consulted to place drain.  Mental status got worse.  He was intubated for airway protection.  Neuro consulted.  Noted to have elevated LFTs and GI consulted.  EEG showed triphasic waves.  ID consulted to assist with ABx.  Renal consulted to assess renal failure and hyperkalemia.  Family opted against renal replacement therapy.  They decided for DNR status and transition to comfort measures.  He was extubated on 29-Dec-2017 and expired at 1409.  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

## 2019-07-23 IMAGING — CR DG CHEST 2V
2 series · 2 of 2 positions shown · non-contrast
Comparison: 05/17/2017

CLINICAL DATA: Follow-up pneumonia

EXAM:
CHEST  2 VIEW

[w chest pa]
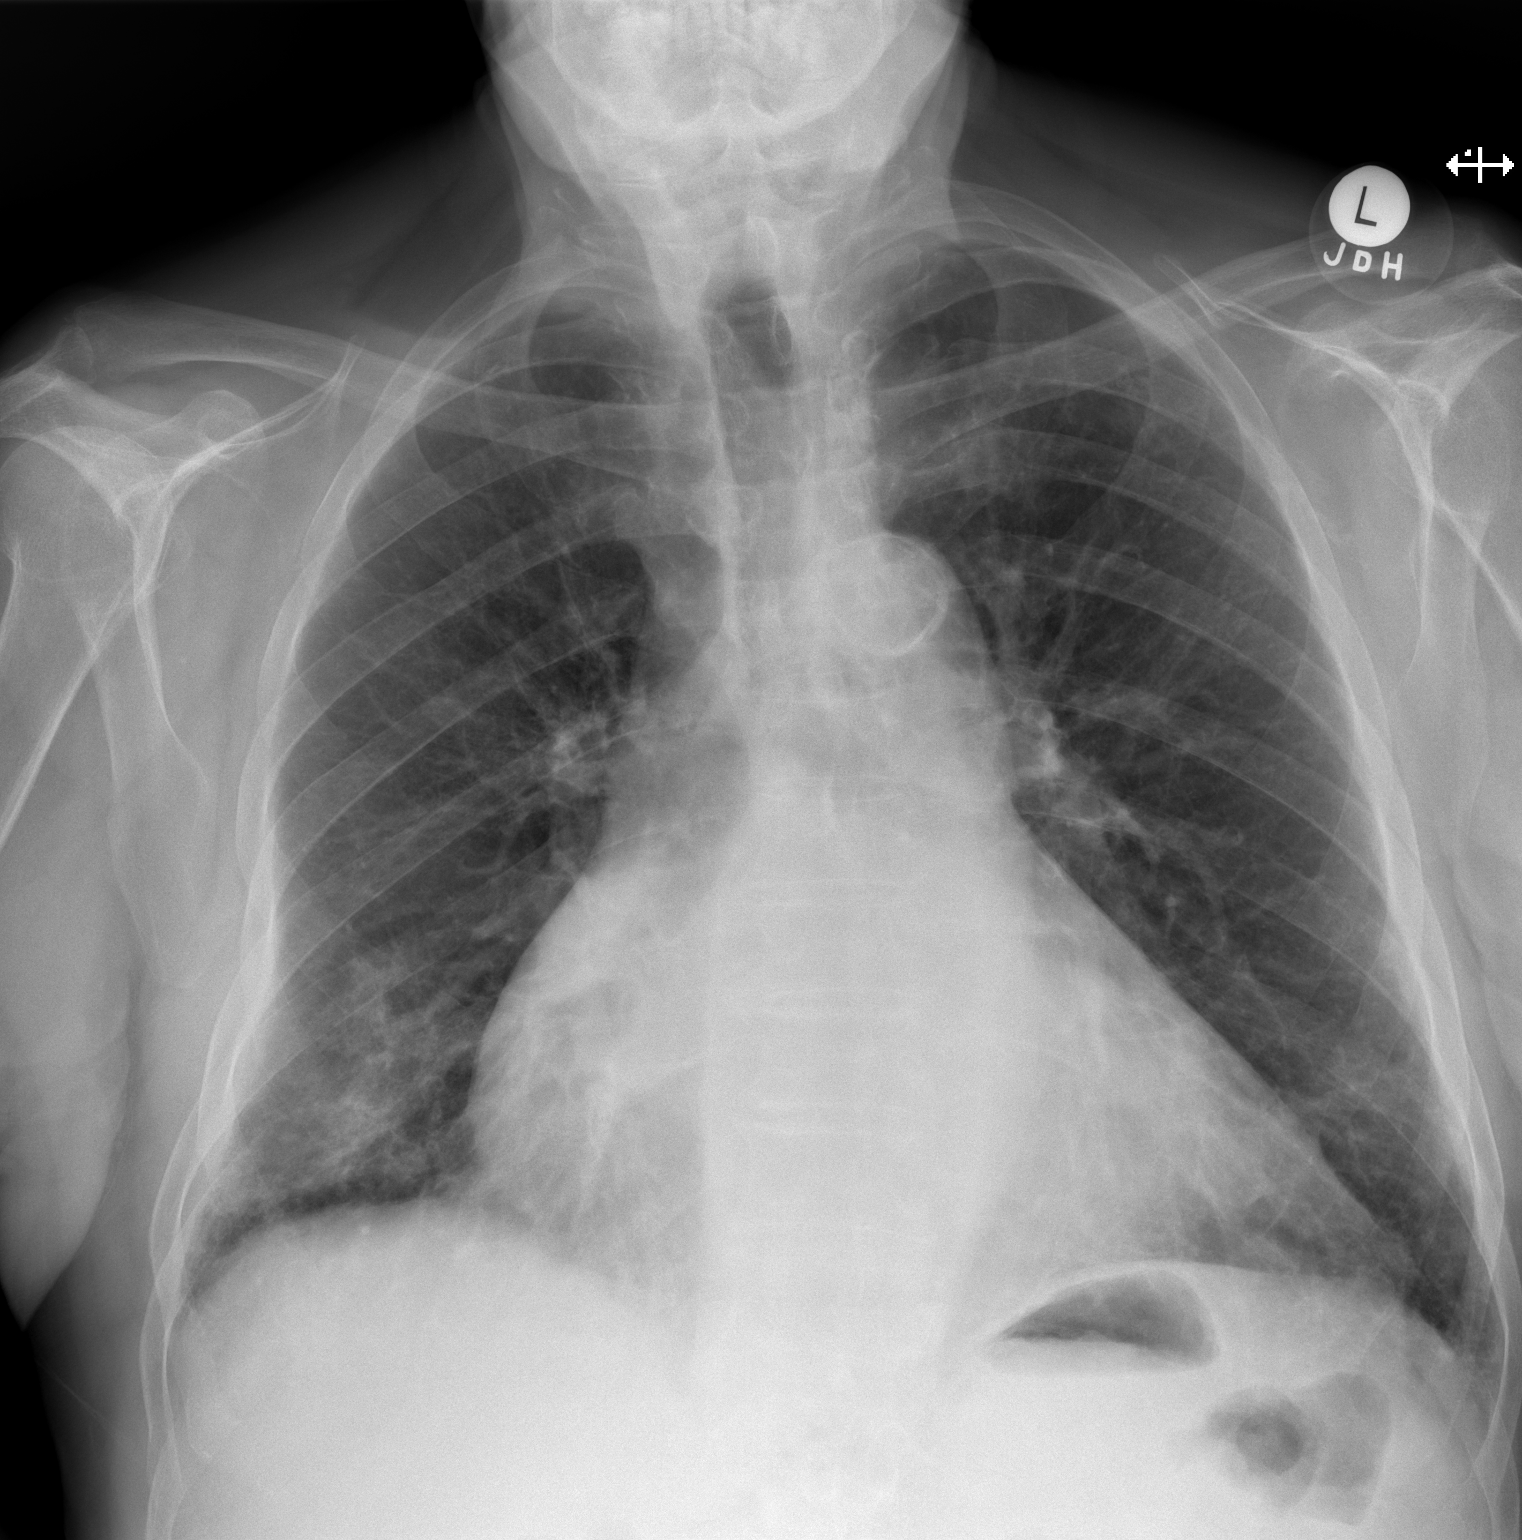

[w chest lat]
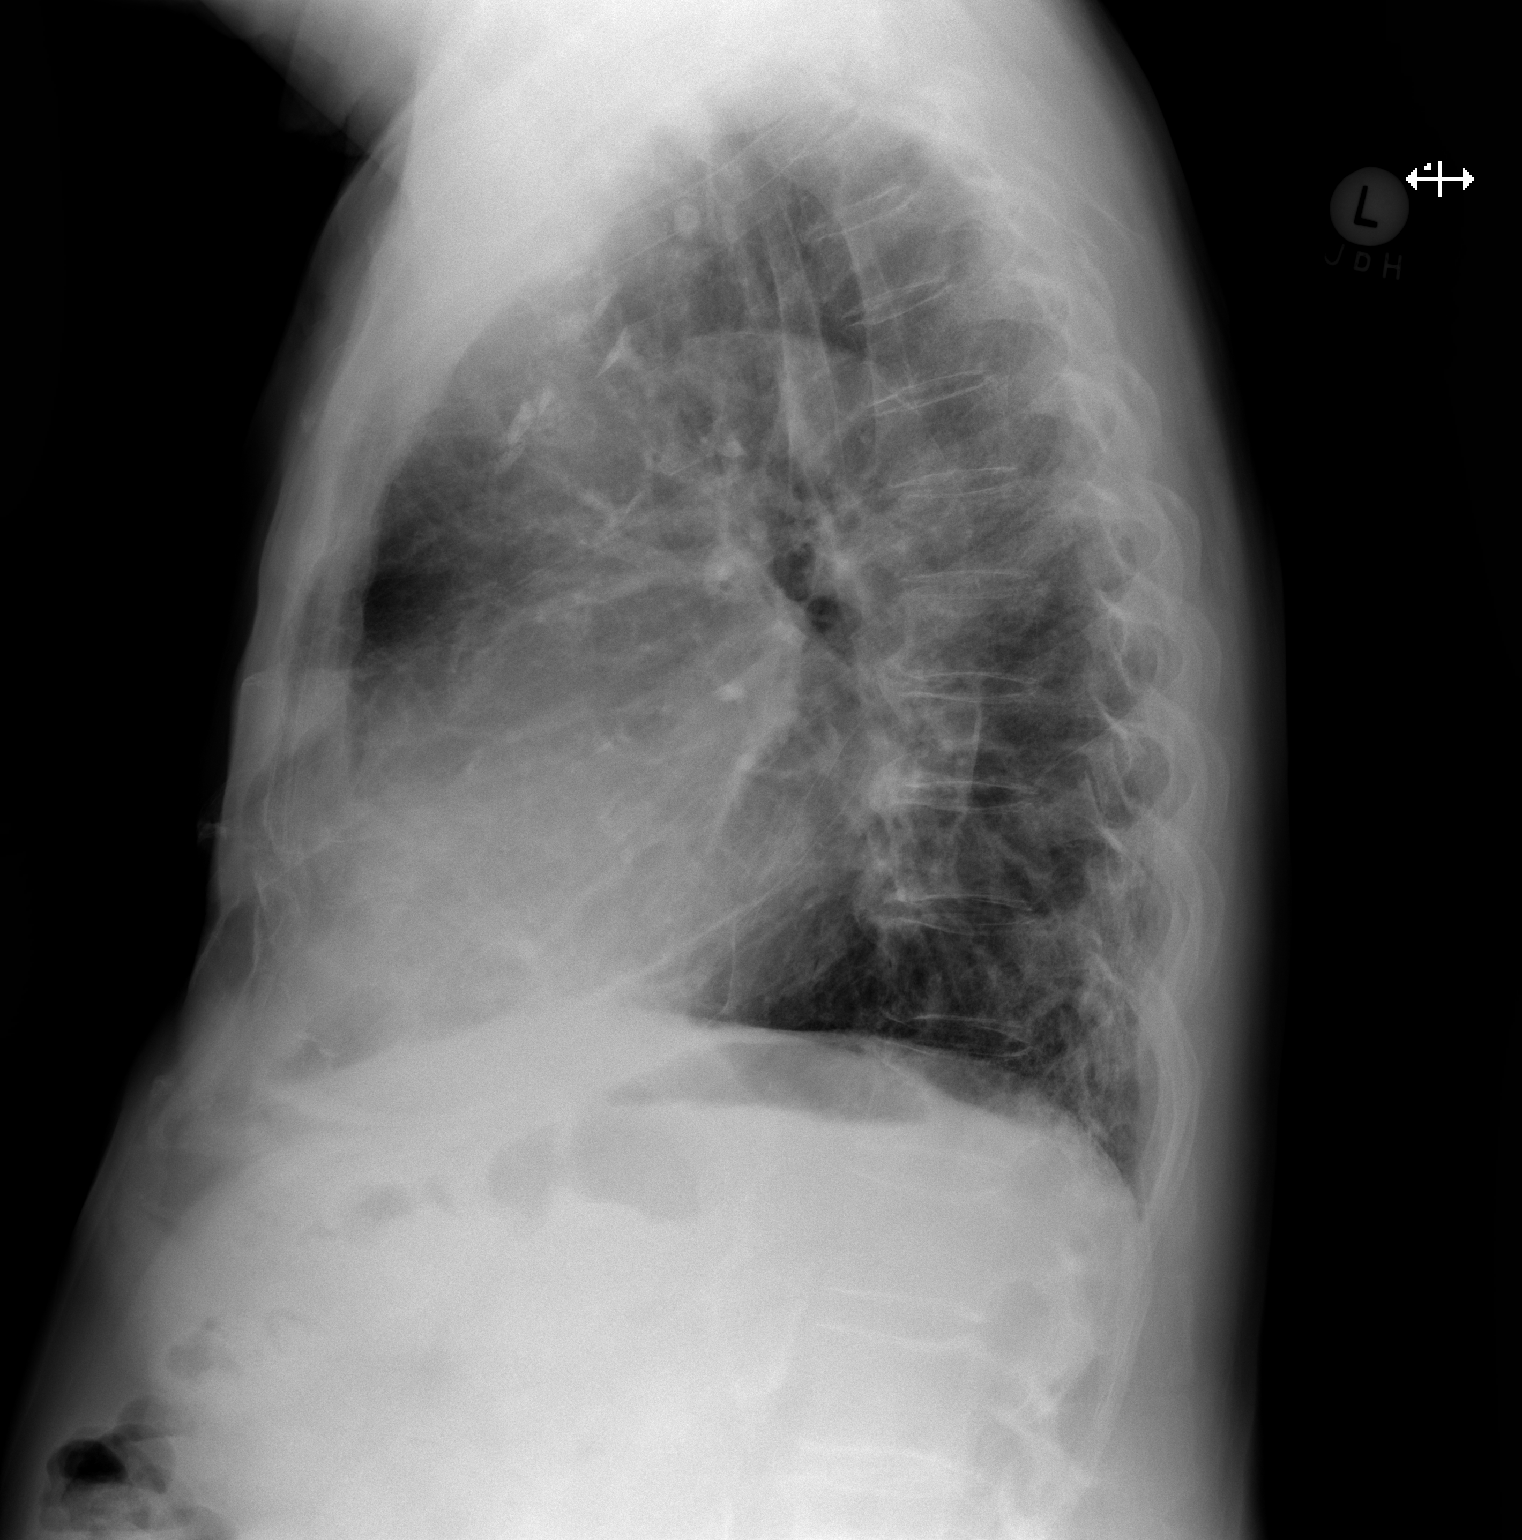

[2 of 2 positions shown; findings below may reference images not displayed]

FINDINGS: Cardiac shadow remains enlarged. Aortic calcifications are again
seen. Some persistent but improved right middle lobe infiltrate is
noted. Continued follow-up is recommended. No new focal abnormality
is seen.
IMPRESSION: Persistent but improving right middle lobe infiltrate. Continued
follow-up is recommended.
